# Patient Record
Sex: Female | Born: 1975 | Race: White | Hispanic: No | Marital: Married | Smoking: Never smoker
Health system: Southern US, Community
[De-identification: ages and names within clinical notes are randomized; demographics above are authoritative.]

## PROBLEM LIST (undated history)

## (undated) DIAGNOSIS — M1990 Unspecified osteoarthritis, unspecified site: Secondary | ICD-10-CM

## (undated) DIAGNOSIS — K5909 Other constipation: Secondary | ICD-10-CM

## (undated) DIAGNOSIS — Z789 Other specified health status: Secondary | ICD-10-CM

## (undated) DIAGNOSIS — N200 Calculus of kidney: Secondary | ICD-10-CM

## (undated) HISTORY — PX: BACK SURGERY: SHX140

## (undated) HISTORY — DX: Other specified health status: Z78.9

## (undated) HISTORY — DX: Unspecified osteoarthritis, unspecified site: M19.90

## (undated) HISTORY — PX: APPENDECTOMY: SHX54

## (undated) HISTORY — PX: ABDOMINAL HYSTERECTOMY: SHX81

## (undated) HISTORY — PX: NEPHRECTOMY: SHX65

---

## 1998-07-11 HISTORY — PX: KIDNEY STONE SURGERY: SHX686

## 2004-05-13 ENCOUNTER — Emergency Department: Payer: Self-pay | Admitting: Internal Medicine

## 2005-01-13 ENCOUNTER — Emergency Department: Payer: Self-pay | Admitting: Emergency Medicine

## 2005-01-27 ENCOUNTER — Ambulatory Visit: Payer: Self-pay

## 2005-07-09 ENCOUNTER — Emergency Department: Payer: Self-pay | Admitting: Emergency Medicine

## 2005-07-11 HISTORY — PX: LITHOTRIPSY: SUR834

## 2006-01-07 ENCOUNTER — Emergency Department: Payer: Self-pay | Admitting: Internal Medicine

## 2006-05-29 ENCOUNTER — Emergency Department: Payer: Self-pay | Admitting: Emergency Medicine

## 2006-10-10 ENCOUNTER — Emergency Department: Payer: Self-pay | Admitting: Emergency Medicine

## 2006-10-11 ENCOUNTER — Emergency Department: Payer: Self-pay

## 2006-10-16 ENCOUNTER — Emergency Department: Payer: Self-pay

## 2007-04-09 ENCOUNTER — Emergency Department: Payer: Self-pay | Admitting: Emergency Medicine

## 2007-04-11 ENCOUNTER — Emergency Department: Payer: Self-pay | Admitting: Emergency Medicine

## 2007-07-24 ENCOUNTER — Emergency Department: Payer: Self-pay | Admitting: Emergency Medicine

## 2007-11-05 ENCOUNTER — Emergency Department: Payer: Self-pay | Admitting: Emergency Medicine

## 2008-01-20 ENCOUNTER — Emergency Department: Payer: Self-pay

## 2008-01-22 ENCOUNTER — Emergency Department: Payer: Self-pay | Admitting: Emergency Medicine

## 2008-04-14 ENCOUNTER — Ambulatory Visit: Payer: Self-pay | Admitting: Unknown Physician Specialty

## 2008-04-17 ENCOUNTER — Ambulatory Visit: Payer: Self-pay | Admitting: Unknown Physician Specialty

## 2008-11-05 ENCOUNTER — Emergency Department: Payer: Self-pay | Admitting: Emergency Medicine

## 2009-02-01 ENCOUNTER — Emergency Department: Payer: Self-pay | Admitting: Emergency Medicine

## 2009-02-22 ENCOUNTER — Emergency Department: Payer: Self-pay | Admitting: Emergency Medicine

## 2009-02-26 ENCOUNTER — Ambulatory Visit: Payer: Self-pay | Admitting: Urology

## 2009-03-08 ENCOUNTER — Emergency Department: Payer: Self-pay | Admitting: Emergency Medicine

## 2009-05-11 ENCOUNTER — Emergency Department: Payer: Self-pay | Admitting: Emergency Medicine

## 2009-05-27 ENCOUNTER — Ambulatory Visit: Payer: Self-pay | Admitting: Urology

## 2009-07-16 ENCOUNTER — Ambulatory Visit: Payer: Self-pay | Admitting: Anesthesiology

## 2009-08-02 ENCOUNTER — Emergency Department: Payer: Self-pay | Admitting: Emergency Medicine

## 2009-08-21 ENCOUNTER — Emergency Department: Payer: Self-pay | Admitting: Emergency Medicine

## 2009-09-28 ENCOUNTER — Emergency Department: Payer: Self-pay | Admitting: Emergency Medicine

## 2010-01-04 ENCOUNTER — Emergency Department: Payer: Self-pay | Admitting: Emergency Medicine

## 2010-04-26 ENCOUNTER — Emergency Department: Payer: Self-pay | Admitting: Emergency Medicine

## 2010-07-25 ENCOUNTER — Emergency Department: Payer: Self-pay | Admitting: Internal Medicine

## 2010-08-24 ENCOUNTER — Emergency Department: Payer: Self-pay | Admitting: Emergency Medicine

## 2010-12-21 ENCOUNTER — Emergency Department: Payer: Self-pay | Admitting: Emergency Medicine

## 2010-12-29 ENCOUNTER — Inpatient Hospital Stay: Payer: Self-pay | Admitting: Unknown Physician Specialty

## 2011-02-24 ENCOUNTER — Emergency Department: Payer: Self-pay | Admitting: Emergency Medicine

## 2011-04-19 ENCOUNTER — Emergency Department: Payer: Self-pay | Admitting: Emergency Medicine

## 2011-06-28 ENCOUNTER — Emergency Department: Payer: Self-pay | Admitting: Emergency Medicine

## 2011-08-05 ENCOUNTER — Emergency Department: Payer: Self-pay | Admitting: Emergency Medicine

## 2011-08-05 LAB — URINALYSIS, COMPLETE
Bilirubin,UR: NEGATIVE
Ketone: NEGATIVE
Nitrite: NEGATIVE
Protein: 30
RBC,UR: 6 /HPF (ref 0–5)
Specific Gravity: 1.023 (ref 1.003–1.030)
WBC UR: 21 /HPF (ref 0–5)

## 2011-08-05 LAB — CBC
HCT: 37.3 % (ref 35.0–47.0)
HGB: 12.6 g/dL (ref 12.0–16.0)
MCH: 28.6 pg (ref 26.0–34.0)
MCHC: 33.9 g/dL (ref 32.0–36.0)
MCV: 84 fL (ref 80–100)
RBC: 4.42 10*6/uL (ref 3.80–5.20)
WBC: 10.1 10*3/uL (ref 3.6–11.0)

## 2011-08-05 LAB — COMPREHENSIVE METABOLIC PANEL
Albumin: 3.9 g/dL (ref 3.4–5.0)
Alkaline Phosphatase: 88 U/L (ref 50–136)
BUN: 10 mg/dL (ref 7–18)
Bilirubin,Total: 0.4 mg/dL (ref 0.2–1.0)
Calcium, Total: 9 mg/dL (ref 8.5–10.1)
Creatinine: 0.54 mg/dL — ABNORMAL LOW (ref 0.60–1.30)
EGFR (African American): >60
Glucose: 100 mg/dL — ABNORMAL HIGH (ref 65–99)
Potassium: 4 mmol/L (ref 3.5–5.1)
SGOT(AST): 24 U/L (ref 15–37)
SGPT (ALT): 20 U/L
Total Protein: 7.9 g/dL (ref 6.4–8.2)

## 2011-08-07 LAB — URINE CULTURE

## 2011-11-28 ENCOUNTER — Emergency Department: Payer: Self-pay | Admitting: Emergency Medicine

## 2011-11-28 LAB — URINALYSIS, COMPLETE
Blood: NEGATIVE
Protein: NEGATIVE
RBC,UR: 2 /HPF (ref 0–5)
Squamous Epithelial: 19
WBC UR: 1 /HPF (ref 0–5)

## 2011-11-28 LAB — BASIC METABOLIC PANEL
Anion Gap: 9 (ref 7–16)
Creatinine: 0.69 mg/dL (ref 0.60–1.30)
EGFR (African American): >60
Osmolality: 281 (ref 275–301)
Potassium: 3.9 mmol/L (ref 3.5–5.1)
Sodium: 141 mmol/L (ref 136–145)

## 2011-11-28 LAB — CBC
HCT: 35.9 % (ref 35.0–47.0)
MCH: 28.4 pg (ref 26.0–34.0)
MCHC: 33.8 g/dL (ref 32.0–36.0)
MCV: 84 fL (ref 80–100)
RDW: 12.7 % (ref 11.5–14.5)
WBC: 7.5 10*3/uL (ref 3.6–11.0)

## 2011-11-29 LAB — URINE CULTURE

## 2012-02-08 ENCOUNTER — Emergency Department: Payer: Self-pay

## 2012-02-08 LAB — BASIC METABOLIC PANEL
Anion Gap: 9 (ref 7–16)
BUN: 12 mg/dL (ref 7–18)
Calcium, Total: 8.7 mg/dL (ref 8.5–10.1)
Chloride: 104 mmol/L (ref 98–107)
Co2: 26 mmol/L (ref 21–32)
Creatinine: 0.68 mg/dL (ref 0.60–1.30)
Glucose: 106 mg/dL — ABNORMAL HIGH (ref 65–99)
Osmolality: 278 (ref 275–301)
Potassium: 3.7 mmol/L (ref 3.5–5.1)
Sodium: 139 mmol/L (ref 136–145)

## 2012-02-08 LAB — URINALYSIS, COMPLETE
Bacteria: NONE SEEN
Blood: NEGATIVE
Glucose,UR: NEGATIVE mg/dL (ref 0–75)
Leukocyte Esterase: NEGATIVE
Nitrite: NEGATIVE
Ph: 7 (ref 4.5–8.0)
WBC UR: 1 /HPF (ref 0–5)

## 2012-02-08 LAB — CBC
HCT: 36.2 % (ref 35.0–47.0)
HGB: 12.3 g/dL (ref 12.0–16.0)
MCH: 28.2 pg (ref 26.0–34.0)
RDW: 13.2 % (ref 11.5–14.5)
WBC: 10.1 10*3/uL (ref 3.6–11.0)

## 2012-03-20 ENCOUNTER — Emergency Department: Payer: Self-pay | Admitting: Emergency Medicine

## 2012-03-20 LAB — CK TOTAL AND CKMB (NOT AT ARMC)
CK, Total: 68 U/L (ref 21–215)
CK-MB: 0.7 ng/mL (ref 0.5–3.6)

## 2012-03-20 LAB — COMPREHENSIVE METABOLIC PANEL
Alkaline Phosphatase: 109 U/L (ref 50–136)
Anion Gap: 9 (ref 7–16)
Bilirubin,Total: 0.4 mg/dL (ref 0.2–1.0)
Chloride: 107 mmol/L (ref 98–107)
Co2: 24 mmol/L (ref 21–32)
Creatinine: 0.69 mg/dL (ref 0.60–1.30)
EGFR (Non-African Amer.): >60
Osmolality: 281 (ref 275–301)
SGOT(AST): 18 U/L (ref 15–37)
SGPT (ALT): 19 U/L (ref 12–78)
Total Protein: 7.3 g/dL (ref 6.4–8.2)

## 2012-03-20 LAB — CBC
HCT: 36.2 % (ref 35.0–47.0)
HGB: 12.4 g/dL (ref 12.0–16.0)
MCH: 28.2 pg (ref 26.0–34.0)
MCHC: 34.2 g/dL (ref 32.0–36.0)
MCV: 82 fL (ref 80–100)
Platelet: 271 10*3/uL (ref 150–440)
RDW: 13 % (ref 11.5–14.5)
WBC: 8.3 10*3/uL (ref 3.6–11.0)

## 2012-06-03 ENCOUNTER — Emergency Department: Payer: Self-pay | Admitting: Emergency Medicine

## 2012-06-03 LAB — COMPREHENSIVE METABOLIC PANEL
Albumin: 4.3 g/dL (ref 3.4–5.0)
BUN: 10 mg/dL (ref 7–18)
Calcium, Total: 8.8 mg/dL (ref 8.5–10.1)
Chloride: 105 mmol/L (ref 98–107)
EGFR (Non-African Amer.): >60
Glucose: 99 mg/dL (ref 65–99)
Osmolality: 273 (ref 275–301)
Potassium: 3.8 mmol/L (ref 3.5–5.1)
SGOT(AST): 24 U/L (ref 15–37)
Sodium: 137 mmol/L (ref 136–145)
Total Protein: 8.1 g/dL (ref 6.4–8.2)

## 2012-06-03 LAB — CBC
HGB: 13.6 g/dL (ref 12.0–16.0)
MCH: 28.7 pg (ref 26.0–34.0)
Platelet: 249 10*3/uL (ref 150–440)
RDW: 13.5 % (ref 11.5–14.5)

## 2012-06-03 LAB — URINALYSIS, COMPLETE
Bacteria: NONE SEEN
Glucose,UR: NEGATIVE mg/dL (ref 0–75)
Ketone: NEGATIVE
Nitrite: NEGATIVE
Protein: NEGATIVE
RBC,UR: 4 /HPF (ref 0–5)
Specific Gravity: 1.019 (ref 1.003–1.030)
Squamous Epithelial: 6
WBC UR: 27 /HPF (ref 0–5)

## 2012-06-03 LAB — LIPASE, BLOOD: Lipase: 207 U/L (ref 73–393)

## 2012-06-05 LAB — URINE CULTURE

## 2012-06-18 ENCOUNTER — Emergency Department: Payer: Self-pay | Admitting: Emergency Medicine

## 2012-06-18 LAB — URINALYSIS, COMPLETE
Bacteria: NONE SEEN
Blood: NEGATIVE
Glucose,UR: NEGATIVE mg/dL (ref 0–75)
Ketone: NEGATIVE
Nitrite: NEGATIVE
RBC,UR: 1 /HPF (ref 0–5)
Specific Gravity: 1.019 (ref 1.003–1.030)
WBC UR: 6 /HPF (ref 0–5)

## 2012-06-18 LAB — CBC
HGB: 12.8 g/dL (ref 12.0–16.0)
MCHC: 34.3 g/dL (ref 32.0–36.0)
RBC: 4.57 10*6/uL (ref 3.80–5.20)
RDW: 13.5 % (ref 11.5–14.5)

## 2012-06-18 LAB — COMPREHENSIVE METABOLIC PANEL
Albumin: 3.8 g/dL (ref 3.4–5.0)
Anion Gap: 9 (ref 7–16)
BUN: 10 mg/dL (ref 7–18)
Bilirubin,Total: 0.4 mg/dL (ref 0.2–1.0)
Calcium, Total: 9.1 mg/dL (ref 8.5–10.1)
Chloride: 105 mmol/L (ref 98–107)
EGFR (African American): >60
Osmolality: 280 (ref 275–301)
Potassium: 3.6 mmol/L (ref 3.5–5.1)
SGOT(AST): 25 U/L (ref 15–37)
SGPT (ALT): 26 U/L (ref 12–78)
Sodium: 140 mmol/L (ref 136–145)
Total Protein: 7.4 g/dL (ref 6.4–8.2)

## 2012-06-18 LAB — LIPASE, BLOOD: Lipase: 178 U/L (ref 73–393)

## 2012-07-19 ENCOUNTER — Emergency Department: Payer: Self-pay | Admitting: Emergency Medicine

## 2012-07-19 LAB — URINALYSIS, COMPLETE
Bilirubin,UR: NEGATIVE
Blood: NEGATIVE
Glucose,UR: NEGATIVE mg/dL (ref 0–75)
Ketone: NEGATIVE
Leukocyte Esterase: NEGATIVE
Nitrite: NEGATIVE
Ph: 7 (ref 4.5–8.0)
RBC,UR: 3 /HPF (ref 0–5)
Specific Gravity: 1.019 (ref 1.003–1.030)
WBC UR: 3 /HPF (ref 0–5)

## 2012-07-19 LAB — CBC
HGB: 12.6 g/dL (ref 12.0–16.0)
MCH: 27.2 pg (ref 26.0–34.0)
MCHC: 32.8 g/dL (ref 32.0–36.0)
MCV: 83 fL (ref 80–100)
Platelet: 271 10*3/uL (ref 150–440)
RBC: 4.64 10*6/uL (ref 3.80–5.20)
RDW: 13.6 % (ref 11.5–14.5)
WBC: 10.2 10*3/uL (ref 3.6–11.0)

## 2012-07-19 LAB — COMPREHENSIVE METABOLIC PANEL
Albumin: 4 g/dL (ref 3.4–5.0)
Alkaline Phosphatase: 122 U/L (ref 50–136)
BUN: 10 mg/dL (ref 7–18)
Bilirubin,Total: 0.4 mg/dL (ref 0.2–1.0)
Calcium, Total: 8.8 mg/dL (ref 8.5–10.1)
EGFR (African American): >60
Glucose: 101 mg/dL — ABNORMAL HIGH (ref 65–99)
Osmolality: 277 (ref 275–301)
Potassium: 3.8 mmol/L (ref 3.5–5.1)
SGOT(AST): 20 U/L (ref 15–37)
SGPT (ALT): 24 U/L (ref 12–78)
Total Protein: 8.2 g/dL (ref 6.4–8.2)

## 2012-07-19 LAB — LIPASE, BLOOD: Lipase: 162 U/L (ref 73–393)

## 2012-09-25 ENCOUNTER — Emergency Department: Payer: Self-pay | Admitting: Emergency Medicine

## 2012-12-04 ENCOUNTER — Emergency Department: Payer: Self-pay | Admitting: Emergency Medicine

## 2012-12-04 LAB — CBC
HCT: 36.3 % (ref 35.0–47.0)
HGB: 12.4 g/dL (ref 12.0–16.0)
MCH: 27.6 pg (ref 26.0–34.0)
MCHC: 34.1 g/dL (ref 32.0–36.0)
Platelet: 284 10*3/uL (ref 150–440)
RBC: 4.49 10*6/uL (ref 3.80–5.20)
WBC: 10.6 10*3/uL (ref 3.6–11.0)

## 2012-12-04 LAB — BASIC METABOLIC PANEL
Anion Gap: 7 (ref 7–16)
BUN: 9 mg/dL (ref 7–18)
Chloride: 103 mmol/L (ref 98–107)
Co2: 27 mmol/L (ref 21–32)
Creatinine: 0.61 mg/dL (ref 0.60–1.30)
EGFR (Non-African Amer.): >60
Glucose: 124 mg/dL — ABNORMAL HIGH (ref 65–99)
Osmolality: 274 (ref 275–301)
Potassium: 3.9 mmol/L (ref 3.5–5.1)

## 2012-12-04 LAB — URINALYSIS, COMPLETE
Bilirubin,UR: NEGATIVE
Ph: 5 (ref 4.5–8.0)
Protein: NEGATIVE
RBC,UR: 1 /HPF (ref 0–5)
Specific Gravity: 1.013 (ref 1.003–1.030)

## 2013-04-28 ENCOUNTER — Emergency Department: Payer: Self-pay | Admitting: Emergency Medicine

## 2013-06-09 ENCOUNTER — Emergency Department: Payer: Self-pay | Admitting: Emergency Medicine

## 2013-06-27 ENCOUNTER — Encounter (HOSPITAL_COMMUNITY): Payer: Self-pay | Admitting: Emergency Medicine

## 2013-06-27 DIAGNOSIS — Z79899 Other long term (current) drug therapy: Secondary | ICD-10-CM | POA: Insufficient documentation

## 2013-06-27 DIAGNOSIS — Z9889 Other specified postprocedural states: Secondary | ICD-10-CM | POA: Insufficient documentation

## 2013-06-27 DIAGNOSIS — M5430 Sciatica, unspecified side: Secondary | ICD-10-CM | POA: Insufficient documentation

## 2013-06-27 DIAGNOSIS — Z87442 Personal history of urinary calculi: Secondary | ICD-10-CM | POA: Insufficient documentation

## 2013-06-27 DIAGNOSIS — Z791 Long term (current) use of non-steroidal anti-inflammatories (NSAID): Secondary | ICD-10-CM | POA: Insufficient documentation

## 2013-06-27 NOTE — ED Notes (Addendum)
Pt. reports persistent lower back pain radiating to left leg onset this evening seen at [facility] 2 weeks ago after a fall discharge home with prescription pain medication /muscle relaxant with no relief. Denies recent injury. No hematuria or dysuria.

## 2013-06-28 ENCOUNTER — Emergency Department (HOSPITAL_COMMUNITY)
Admission: EM | Admit: 2013-06-28 | Discharge: 2013-06-28 | Disposition: A | Discharge status: Home / Self Care | Payer: Self-pay | Attending: Emergency Medicine | Admitting: Emergency Medicine

## 2013-06-28 DIAGNOSIS — M549 Dorsalgia, unspecified: Secondary | ICD-10-CM

## 2013-06-28 DIAGNOSIS — M5432 Sciatica, left side: Secondary | ICD-10-CM

## 2013-06-28 HISTORY — DX: Calculus of kidney: N20.0

## 2013-06-28 LAB — URINE MICROSCOPIC-ADD ON

## 2013-06-28 LAB — URINALYSIS, ROUTINE W REFLEX MICROSCOPIC
Glucose, UA: NEGATIVE mg/dL
Hgb urine dipstick: NEGATIVE
Ketones, ur: NEGATIVE mg/dL
Nitrite: NEGATIVE
Protein, ur: NEGATIVE mg/dL
Urobilinogen, UA: 1 mg/dL (ref 0.0–1.0)

## 2013-06-28 MED ORDER — HYDROMORPHONE HCL PF 1 MG/ML IJ SOLN
1.0000 mg | Freq: Once | INTRAMUSCULAR | Status: AC
  Administered 2013-06-28: 1 mg via INTRAVENOUS
  Filled 2013-06-28: qty 1

## 2013-06-28 MED ORDER — OXYCODONE-ACETAMINOPHEN 7.5-325 MG PO TABS: 1.0000 | ORAL_TABLET | ORAL | Status: DC | PRN

## 2013-06-28 MED ORDER — PREDNISONE 20 MG PO TABS: 40.0000 mg | ORAL_TABLET | Freq: Every day | ORAL | Status: DC

## 2013-06-28 MED ORDER — DIAZEPAM 5 MG PO TABS
5.0000 mg | ORAL_TABLET | Freq: Once | ORAL | Status: AC
  Administered 2013-06-28: 5 mg via ORAL
  Filled 2013-06-28: qty 1

## 2013-06-28 MED ORDER — DEXAMETHASONE SODIUM PHOSPHATE 10 MG/ML IJ SOLN: 10.0000 mg | Freq: Once | INTRAMUSCULAR | Status: DC

## 2013-06-28 MED ORDER — FENTANYL CITRATE 0.05 MG/ML IJ SOLN
50.0000 ug | Freq: Once | INTRAMUSCULAR | Status: AC
  Administered 2013-06-28: 50 ug via INTRAVENOUS
  Filled 2013-06-28: qty 2

## 2013-06-28 MED ORDER — DEXAMETHASONE SODIUM PHOSPHATE 10 MG/ML IJ SOLN
10.0000 mg | Freq: Once | INTRAMUSCULAR | Status: AC
  Administered 2013-06-28: 10 mg via INTRAVENOUS
  Filled 2013-06-28: qty 1

## 2013-06-28 MED ORDER — DIAZEPAM 5 MG PO TABS: 5.0000 mg | ORAL_TABLET | Freq: Two times a day (BID) | ORAL | Status: DC

## 2013-06-28 MED ORDER — LORAZEPAM 2 MG/ML IJ SOLN
1.0000 mg | Freq: Once | INTRAMUSCULAR | Status: AC
  Administered 2013-06-28: 1 mg via INTRAVENOUS
  Filled 2013-06-28: qty 1

## 2013-06-28 MED ORDER — HYDROMORPHONE HCL PF 1 MG/ML IJ SOLN: 1.0000 mg | Freq: Once | INTRAMUSCULAR | Status: DC

## 2013-06-28 NOTE — ED Provider Notes (Signed)
CSN: [id number]     Arrival date & time 06/27/13  2304 History   First MD Initiated Contact with Patient 06/28/13 0215     Chief Complaint  Patient presents with  . Back Pain   (Consider location/radiation/quality/duration/timing/severity/associated sxs/prior Treatment) HPI Comments: Patient is a 37 year old female with a history of back surgery and kidney stones who presents today for worsening back pain. Patient states the pain originates in her left low back and intermittently radiates down her posterior left leg. Patient states the pain has been gradually worsening over the last 2 weeks. Ambulation worsens the pain and pain is mildly relieved with rest. Patient has tried Percocet, ibuprofen, and Robaxin for her symptoms without relief. She has orthopedic followup scheduled for 07/12/2013. Patient denies associated fever, a history of cancer or IV drug use, abdominal pain, vomiting, dysuria or hematuria, vaginal complaints, bowel or bladder incontinence, general perianal numbness, saddle anesthesia, numbness/tingling, or an inability to ambulate.  Patient is a 37 y.o. female presenting with back pain. The history is provided by the patient. No language interpreter was used.  Back Pain Associated symptoms: no abdominal pain, no dysuria, no fever, no numbness and no weakness     Past Medical History  Diagnosis Date  . Kidney stone    Past Surgical History  Procedure Laterality Date  . Back surgery    . Abdominal hysterectomy     No family history on file. History  Substance Use Topics  . Smoking status: Never Smoker   . Smokeless tobacco: Not on file  . Alcohol Use: No   OB History   Grav Para Term Preterm Abortions TAB SAB Ect Mult Living                 Review of Systems  Constitutional: Negative for fever.  Gastrointestinal: Negative for vomiting and abdominal pain.  Genitourinary: Negative for dysuria, hematuria, vaginal bleeding and vaginal discharge.  Musculoskeletal:  Positive for back pain.  Neurological: Negative for weakness and numbness.  All other systems reviewed and are negative.    Allergies  Review of patient's allergies indicates no known allergies.  Home Medications   Current Outpatient Rx  Name  Route  Sig  Dispense  Refill  . methocarbamol (ROBAXIN) 750 MG tablet   Oral   Take 1,500 mg by mouth 3 (three) times daily.         . naproxen sodium (ANAPROX) 220 MG tablet   Oral   Take 220 mg by mouth 2 (two) times daily with a meal.         . diazepam (VALIUM) 5 MG tablet   Oral   Take 1 tablet (5 mg total) by mouth 2 (two) times daily.   10 tablet   0   . oxyCODONE-acetaminophen (PERCOCET) 7.5-325 MG per tablet   Oral   Take 1 tablet by mouth every 4 (four) hours as needed for pain.   17 tablet   0   . predniSONE (DELTASONE) 20 MG tablet   Oral   Take 2 tablets (40 mg total) by mouth daily.   10 tablet   0    BP 113/65  Pulse 103  Temp(Src) 98.2 F (36.8 C)  Resp 17  Ht 5\' 5"  (1.651 m)  Wt 225 lb (102.059 kg)  BMI 37.44 kg/m2  SpO2 95%  Physical Exam  Nursing note and vitals reviewed. Constitutional: She is oriented to person, place, and time. She appears well-developed and well-nourished. No distress.  HENT:  Head: Normocephalic and atraumatic.  Eyes: Conjunctivae and EOM are normal. No scleral icterus.  Neck: Normal range of motion.  Cardiovascular: Normal rate, regular rhythm and intact distal pulses.   DP and PT pulses 2+ in left lower extremity. Capillary refill normal.  Pulmonary/Chest: Effort normal. No respiratory distress.  Musculoskeletal: She exhibits tenderness (L lumbar paraspinal muscles).       Right shoulder: She exhibits tenderness.       Lumbar back: She exhibits tenderness.       Back:  No midline tenderness to the lumbosacral spine. No bony deformities or step-offs palpated. Positive straight leg raise.  Neurological: She is alert and oriented to person, place, and time. She has  normal reflexes.  Patellar and Achilles reflexes 2+ and left lower extremity. No gross sensory deficits appreciated. Patient moves extremities without ataxia.  Skin: Skin is warm and dry. No rash noted. She is not diaphoretic. No erythema. No pallor.  Psychiatric: She has a normal mood and affect. Her behavior is normal.    ED Course  Procedures (including critical care time) Labs Review Labs Reviewed  URINALYSIS, ROUTINE W REFLEX MICROSCOPIC - Abnormal; Notable for the following:    APPearance CLOUDY (*)    Leukocytes, UA MODERATE (*)    All other components within normal limits  URINE MICROSCOPIC-ADD ON - Abnormal; Notable for the following:    Squamous Epithelial / LPF MANY (*)    Bacteria, UA FEW (*)    All other components within normal limits  URINE CULTURE   Imaging Review No results found.  EKG Interpretation   None       MDM   1. Back pain   2. Sciatica, left    37 year old female with a history of back surgery presents for worsening back pain. Pain began 2 weeks ago and has been gradually worsening since without relief from Percocet, Robaxin, or ibuprofen. Patient well and nontoxic appearing, hemodynamically stable, and afebrile. She denies fevers, chills, trauma or injury to her back, incontinence, or numbness/tingling. Patient neurovascularly intact in physical exam. No tenderness to the thoracic or lumbosacral midline appreciated. Patient does appear to be in an decent amount of discomfort; she is tearful at bedside. Will treat pain with Dilaudid, Valium, and Decadron and reassess. Urinalysis pending.  Urinalysis nonsuggestive of infection and without blood. Patient's pain has significantly improved with Dilaudid, Valium, Ativan, and Decadron. Patient now weight bearing with some discomfort; overall movement has greatly improved since arrival. No red flags or signs concerning for cauda equina; do not believe emergent MR is indicated at this time. Patient with  orthopedic f/u for which she is reliable. She is stable and appropriate for discharge today with orthopedic f/u. Return precautions discussed and patient agreeable to plan with no unaddressed concerns.    Antony Madura, [place] 06/28/13 [phone]

## 2013-06-28 NOTE — ED Provider Notes (Signed)
Medical screening examination/treatment/procedure(s) were conducted as a shared visit with non-physician practitioner(s) and myself.  I personally evaluated the patient during the encounter.  EKG Interpretation   None      Pt was able to walk earlier tonight, couple weeks pain low back radiates to left knee, no weak/numb left leg, Pt understands no apparent indication for emergent MRI in ED.  Hurman Horn, MD 07/02/13 [phone]

## 2013-06-28 NOTE — ED Notes (Signed)
Pt and pts husband refused crutches - state that she has them at home. Detailed explanation of use of crutches given.

## 2013-06-29 LAB — URINE CULTURE: Colony Count: >=100000

## 2013-07-01 NOTE — ED Provider Notes (Signed)
Medical screening examination/treatment/procedure(s) were performed by non-physician practitioner and as supervising physician I was immediately available for consultation/collaboration.  Alek Poncedeleon M Ariella Voit, MD 07/01/13 2354 

## 2013-10-24 ENCOUNTER — Other Ambulatory Visit (HOSPITAL_COMMUNITY): Payer: Self-pay | Admitting: *Deleted

## 2013-10-24 DIAGNOSIS — R011 Cardiac murmur, unspecified: Secondary | ICD-10-CM

## 2013-10-25 ENCOUNTER — Other Ambulatory Visit: Payer: Self-pay

## 2013-10-25 ENCOUNTER — Other Ambulatory Visit (INDEPENDENT_AMBULATORY_CARE_PROVIDER_SITE_OTHER): Payer: BC Managed Care – PPO

## 2013-10-25 DIAGNOSIS — R011 Cardiac murmur, unspecified: Secondary | ICD-10-CM

## 2013-12-25 ENCOUNTER — Ambulatory Visit: Payer: Self-pay | Admitting: Specialist

## 2013-12-26 ENCOUNTER — Ambulatory Visit: Payer: BC Managed Care – PPO | Admitting: Podiatry

## 2014-04-10 DIAGNOSIS — E668 Other obesity: Secondary | ICD-10-CM | POA: Insufficient documentation

## 2014-04-10 DIAGNOSIS — E669 Obesity, unspecified: Secondary | ICD-10-CM | POA: Insufficient documentation

## 2014-06-09 ENCOUNTER — Emergency Department: Payer: Self-pay | Admitting: Emergency Medicine

## 2014-06-09 LAB — COMPREHENSIVE METABOLIC PANEL
ALT: 23 U/L
ANION GAP: 6 — AB (ref 7–16)
AST: 35 U/L (ref 15–37)
Albumin: 4 g/dL (ref 3.4–5.0)
Alkaline Phosphatase: 99 U/L
BUN: 11 mg/dL (ref 7–18)
Bilirubin,Total: 0.6 mg/dL (ref 0.2–1.0)
CHLORIDE: 106 mmol/L (ref 98–107)
Calcium, Total: 8.8 mg/dL (ref 8.5–10.1)
Co2: 28 mmol/L (ref 21–32)
Creatinine: 0.45 mg/dL — ABNORMAL LOW (ref 0.60–1.30)
EGFR (African American): >60
EGFR (Non-African Amer.): >60
Glucose: 89 mg/dL (ref 65–99)
OSMOLALITY: 278 (ref 275–301)
POTASSIUM: 4.4 mmol/L (ref 3.5–5.1)
Sodium: 140 mmol/L (ref 136–145)
TOTAL PROTEIN: 7.6 g/dL (ref 6.4–8.2)

## 2014-06-09 LAB — CBC
HCT: 38.7 % (ref 35.0–47.0)
HGB: 12.5 g/dL (ref 12.0–16.0)
MCH: 28.1 pg (ref 26.0–34.0)
MCHC: 32.3 g/dL (ref 32.0–36.0)
MCV: 87 fL (ref 80–100)
Platelet: 232 10*3/uL (ref 150–440)
RBC: 4.44 10*6/uL (ref 3.80–5.20)
RDW: 13.7 % (ref 11.5–14.5)
WBC: 5.9 10*3/uL (ref 3.6–11.0)

## 2014-06-09 LAB — URINALYSIS, COMPLETE
Bilirubin,UR: NEGATIVE
Blood: NEGATIVE
Glucose,UR: NEGATIVE mg/dL (ref 0–75)
KETONE: NEGATIVE
NITRITE: NEGATIVE
PH: 6 (ref 4.5–8.0)
Protein: NEGATIVE
Specific Gravity: 1.021 (ref 1.003–1.030)
Squamous Epithelial: 6
WBC UR: 1 /HPF (ref 0–5)

## 2014-07-09 ENCOUNTER — Emergency Department: Payer: Self-pay | Admitting: Emergency Medicine

## 2014-07-09 LAB — CBC
HCT: 38.6 % (ref 35.0–47.0)
HGB: 12.6 g/dL (ref 12.0–16.0)
MCH: 28.4 pg (ref 26.0–34.0)
MCHC: 32.7 g/dL (ref 32.0–36.0)
MCV: 87 fL (ref 80–100)
Platelet: 208 10*3/uL (ref 150–440)
RBC: 4.44 10*6/uL (ref 3.80–5.20)
RDW: 13 % (ref 11.5–14.5)
WBC: 9.2 10*3/uL (ref 3.6–11.0)

## 2014-07-09 LAB — COMPREHENSIVE METABOLIC PANEL
ALK PHOS: 97 U/L
ANION GAP: 8 (ref 7–16)
Albumin: 3.8 g/dL (ref 3.4–5.0)
BUN: 8 mg/dL (ref 7–18)
Bilirubin,Total: 0.5 mg/dL (ref 0.2–1.0)
CO2: 27 mmol/L (ref 21–32)
Calcium, Total: 9 mg/dL (ref 8.5–10.1)
Chloride: 108 mmol/L — ABNORMAL HIGH (ref 98–107)
Creatinine: 0.58 mg/dL — ABNORMAL LOW (ref 0.60–1.30)
EGFR (Non-African Amer.): >60
GLUCOSE: 94 mg/dL (ref 65–99)
Osmolality: 283 (ref 275–301)
Potassium: 3.5 mmol/L (ref 3.5–5.1)
SGOT(AST): 27 U/L (ref 15–37)
SGPT (ALT): 16 U/L
SODIUM: 143 mmol/L (ref 136–145)
Total Protein: 7.5 g/dL (ref 6.4–8.2)

## 2014-07-09 LAB — GC/CHLAMYDIA PROBE AMP

## 2014-07-09 LAB — URINALYSIS, COMPLETE
Bilirubin,UR: NEGATIVE
Glucose,UR: NEGATIVE mg/dL (ref 0–75)
Ketone: NEGATIVE
Nitrite: POSITIVE
Ph: 6 (ref 4.5–8.0)
Protein: >=500
RBC,UR: 80 /HPF (ref 0–5)
Specific Gravity: 1.013 (ref 1.003–1.030)
WBC UR: 822 /HPF (ref 0–5)

## 2014-07-09 LAB — WET PREP, GENITAL

## 2014-07-11 HISTORY — PX: LAPAROSCOPIC GASTRIC SLEEVE RESECTION: SHX5895

## 2014-07-11 LAB — URINE CULTURE

## 2014-07-23 ENCOUNTER — Ambulatory Visit: Payer: Self-pay | Admitting: Specialist

## 2014-12-17 ENCOUNTER — Encounter: Payer: Self-pay | Admitting: *Deleted

## 2014-12-17 ENCOUNTER — Emergency Department
Admission: EM | Admit: 2014-12-17 | Discharge: 2014-12-17 | Disposition: A | Discharge status: Home / Self Care | Payer: Self-pay | Attending: Emergency Medicine | Admitting: Emergency Medicine

## 2014-12-17 DIAGNOSIS — L508 Other urticaria: Secondary | ICD-10-CM

## 2014-12-17 DIAGNOSIS — Z7952 Long term (current) use of systemic steroids: Secondary | ICD-10-CM | POA: Insufficient documentation

## 2014-12-17 DIAGNOSIS — Z79899 Other long term (current) drug therapy: Secondary | ICD-10-CM | POA: Insufficient documentation

## 2014-12-17 DIAGNOSIS — L509 Urticaria, unspecified: Secondary | ICD-10-CM | POA: Insufficient documentation

## 2014-12-17 DIAGNOSIS — Z791 Long term (current) use of non-steroidal anti-inflammatories (NSAID): Secondary | ICD-10-CM | POA: Insufficient documentation

## 2014-12-17 NOTE — ED Provider Notes (Signed)
[facility] Emergency Department Provider Note  ____________________________________________  Time seen:1353 I have reviewed the triage vital signs and the nursing notes.   HISTORY  Chief Complaint Rash   HPI Katelyn Holland is a 39 y.o. female states that today at work she noticed a rash on both her arms. She states that it was red in color and was "moving up her arm". She denies any itching. She is unaware of any contact with any item that she may have had an allergic reaction to an denies any contact with poison oak or poison ivy. She denies any difficulty breathing, swallowing or speaking. She denies any pain.   Past Medical History  Diagnosis Date  . Kidney stone     There are no active problems to display for this patient.   Past Surgical History  Procedure Laterality Date  . Back surgery    . Abdominal hysterectomy    . Laparoscopic gastric sleeve resection      Current Outpatient Rx  Name  Route  Sig  Dispense  Refill  . diazepam (VALIUM) 5 MG tablet   Oral   Take 1 tablet (5 mg total) by mouth 2 (two) times daily.   10 tablet   0   . methocarbamol (ROBAXIN) 750 MG tablet   Oral   Take 1,500 mg by mouth 3 (three) times daily.         . naproxen sodium (ANAPROX) 220 MG tablet   Oral   Take 220 mg by mouth 2 (two) times daily with a meal.         . oxyCODONE-acetaminophen (PERCOCET) 7.5-325 MG per tablet   Oral   Take 1 tablet by mouth every 4 (four) hours as needed for pain.   17 tablet   0   . predniSONE (DELTASONE) 20 MG tablet   Oral   Take 2 tablets (40 mg total) by mouth daily.   10 tablet   0     Allergies Review of patient's allergies indicates no known allergies.  No family history on file.  Social History History  Substance Use Topics  . Smoking status: Never Smoker   . Smokeless tobacco: Not on file  . Alcohol Use: Yes    Review of Systems Constitutional: No fever/chills Eyes: No visual  changes. ENT: No sore throat. Cardiovascular: Denies chest pain. Respiratory: Denies shortness of breath. Gastrointestinal: No abdominal pain.  No nausea, no vomiting.   Genitourinary: Negative for dysuria. Musculoskeletal: Negative for back pain. Skin: Positive for rash. Neurological: Negative for headaches, focal weakness or numbness.  10-point ROS otherwise negative.  ____________________________________________   PHYSICAL EXAM:  VITAL SIGNS: ED Triage Vitals  Enc Vitals Group     BP 12/17/14 1244 114/50 mmHg     Pulse Rate 12/17/14 1244 64     Resp 12/17/14 1244 16     Temp 12/17/14 1244 98.2 F (36.8 C)     Temp Source 12/17/14 1244 Oral     SpO2 12/17/14 1244 97 %     Weight 12/17/14 1244 150 lb (68.04 kg)     Height 12/17/14 1244  (1.626 m)     Head Cir --      Peak Flow --      Pain Score --      Pain Loc --      Pain Edu? --      Excl. in GC? --     Constitutional: Alert and oriented. Well appearing and  in no acute distress. Eyes: Conjunctivae are normal. PERRL. EOMI. Head: Atraumatic. Nose: No congestion/rhinnorhea. Mouth/Throat: Mucous membranes are moist.  Oropharynx non-erythematous. Neck: No stridor.   Hematological/Lymphatic/Immunilogical: No cervical lymphadenopathy. Cardiovascular: Normal rate, regular rhythm. Grossly normal heart sounds.  Good peripheral circulation. Respiratory: Normal respiratory effort.  No retractions. Lungs CTAB. Musculoskeletal: No lower extremity tenderness nor edema.  No joint effusions. Neurologic:  Normal speech and language. No gross focal neurologic deficits are appreciated. Speech is normal. No gait instability. Skin:  Skin is warm, dry and intact. There is absolutely no rash noted on the upper forearms. Patient states that it has completely resolved while being in the emergency room. There is no itching or redness. There is no other rash noted on the torso or lower extremities. Psychiatric: Mood and affect are  normal. Speech and behavior are normal.  ____________________________________________   LABS (all labs ordered are listed, but only abnormal results are displayed)  Labs Reviewed - No data to display  PROCEDURES  Procedure(s) performed: None  Critical Care performed: No  ____________________________________________   INITIAL IMPRESSION / ASSESSMENT AND PLAN / ED COURSE  Pertinent labs & imaging results that were available during my care of the patient were reviewed by me and considered in my medical decision making (see chart for details).  Patient was advised to take some Benadryl if the rash returns. At this time it appears she could've had some urticarial reaction but there is no rash to evaluate at this time. ____________________________________________   FINAL CLINICAL IMPRESSION(S) / ED DIAGNOSES  Final diagnoses:  Urticaria, acute      Tommi RumpsRhonda L Lasonja Lakins, PA-C 12/17/14 1440  Phineas SemenGraydon Goodman, MD 12/17/14 [phone]

## 2014-12-17 NOTE — ED Notes (Signed)
Pt reports she noticed a rash on both of her arms this morning, denies itching

## 2014-12-17 NOTE — ED Notes (Signed)
NAD noted at time of D/C. Pt denies questions or concerns. Pt ambulatory to the lobby at this time.  

## 2014-12-17 NOTE — Discharge Instructions (Signed)
Hives  Hives are itchy, red, puffy (swollen) areas of the skin. Hives can change in size and location on your body. Hives can come and go for hours, days, or weeks. Hives do not spread from person to person (noncontagious). Scratching, exercise, and stress can make your hives worse.  HOME CARE  · Avoid things that cause your hives (triggers).  · Take antihistamine medicines as told by your doctor. Do not drive while taking an antihistamine.  · Take any other medicines for itching as told by your doctor.  · Wear loose-fitting clothing.  · Keep all doctor visits as told.  GET HELP RIGHT AWAY IF:   · You have a fever.  · Your tongue or lips are puffy.  · You have trouble breathing or swallowing.  · You feel tightness in the throat or chest.  · You have belly (abdominal) pain.  · You have lasting or severe itching that is not helped by medicine.  · You have painful or puffy joints.  These problems may be the first sign of a life-threatening allergic reaction. Call your local emergency services (911 in [place]  MAKE SURE YOU:   · Understand these instructions.  · Will watch your condition.  · Will get help right away if you are not doing well or get worse.  Document Released: 04/05/2008 Document Revised: 12/27/2011 Document Reviewed: 09/20/2011  Exit[employer]® Patient Information ©2015 [employer]. This information is not intended to replace advice given to you by your health care provider. Make sure you discuss any questions you have with your health care provider.

## 2014-12-17 NOTE — ED Notes (Signed)
Pt states a rash on top of her hands moving down her arms, upon assessment pt hands appropriate color and tempature

## 2014-12-24 ENCOUNTER — Ambulatory Visit (INDEPENDENT_AMBULATORY_CARE_PROVIDER_SITE_OTHER): Payer: Self-pay | Admitting: Family Medicine

## 2014-12-24 ENCOUNTER — Telehealth: Payer: Self-pay | Admitting: Family Medicine

## 2014-12-24 ENCOUNTER — Encounter: Payer: Self-pay | Admitting: Family Medicine

## 2014-12-24 ENCOUNTER — Other Ambulatory Visit: Payer: Self-pay | Admitting: Family Medicine

## 2014-12-24 VITALS — BP 105/69 | HR 71 | Temp 98.6°F | Resp 16 | Ht 64.0 in | Wt 165.4 lb

## 2014-12-24 DIAGNOSIS — Z8249 Family history of ischemic heart disease and other diseases of the circulatory system: Secondary | ICD-10-CM

## 2014-12-24 DIAGNOSIS — R6882 Decreased libido: Secondary | ICD-10-CM | POA: Insufficient documentation

## 2014-12-24 DIAGNOSIS — G4762 Sleep related leg cramps: Secondary | ICD-10-CM

## 2014-12-24 DIAGNOSIS — G8929 Other chronic pain: Secondary | ICD-10-CM

## 2014-12-24 DIAGNOSIS — Z01419 Encounter for gynecological examination (general) (routine) without abnormal findings: Secondary | ICD-10-CM

## 2014-12-24 DIAGNOSIS — M549 Dorsalgia, unspecified: Principal | ICD-10-CM

## 2014-12-24 MED ORDER — MAGNESIUM 400 MG PO TABS: 1.0000 | ORAL_TABLET | Freq: Every day | ORAL | Status: DC

## 2014-12-24 MED ORDER — CYCLOBENZAPRINE HCL 10 MG PO TABS: 10.0000 mg | ORAL_TABLET | Freq: Every day | ORAL | Status: DC

## 2014-12-24 NOTE — Progress Notes (Signed)
Subjective:    Patient ID: Katelyn Holland, female    DOB: 01/02/1976, 39 y.o.   MRN: [id number]  HPI: Katelyn Holland is a 39 y.o. female presenting on 12/24/2014 for Gynecologic Exam  Pt presents for well-woman exam. Overall doing well. Would like to discuss mammogram recommendations today. She also has concerns about low libido. 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 Doing very well. Back pain is gone. Exercising regularly and trying to eat healthy. She feels great. She does have concerns about leg cramps that keep her awake at night. Has taken flexeril in the past to help. Has not tried magnesium.  Gynecologic Exam The patient's pertinent negatives include no genital itching, genital lesions, genital odor, genital rash, pelvic pain, vaginal bleeding or vaginal discharge. Pertinent negatives include no abdominal pain, chills, constipation, diarrhea, dysuria, fever, flank pain, nausea, painful intercourse, urgency or vomiting. She is sexually active. No, her partner does not have an STD. Her past medical history is significant for a gynecological surgery.    Past Medical History  Diagnosis Date  . Kidney stone   . Gravida 1 para 1    History   Social History  . Marital Status: Married    Spouse Name: N/A  . Number of Children: N/A  . Years of Education: N/A   Occupational History  . Not on file.   Social History Main Topics  . Smoking status: Never Smoker   . Smokeless tobacco: Never Used  . Alcohol Use: 0.0 oz/week    0 Standard drinks or equivalent per week  . Drug Use: No  . Sexual Activity: Not on file   Other Topics Concern  . Not on file   Social History Narrative   Family History  Problem Relation Age of Onset  . Heart disease Mother 1  . Cancer Maternal Grandmother    No current outpatient prescriptions on file prior to visit.   No current facility-administered medications on file prior to visit.    Review of Systems   Constitutional: Negative for fever and chills.  HENT: Negative.   Respiratory: Negative for cough, chest tightness and wheezing.   Cardiovascular: Negative for chest pain and leg swelling.  Gastrointestinal: Negative for nausea, vomiting, abdominal pain, diarrhea and constipation.  Endocrine: Negative.  Negative for cold intolerance, heat intolerance, polydipsia, polyphagia and polyuria.  Genitourinary: Negative for dysuria, urgency, flank pain, vaginal bleeding, vaginal discharge, difficulty urinating, vaginal pain and pelvic pain.  Musculoskeletal: Positive for myalgias (leg cramps at night.).  Neurological: Negative for dizziness, light-headedness and numbness.  Psychiatric/Behavioral: Negative.    Per HPI unless specifically indicated above     Objective:    BP 105/69 mmHg  Pulse 71  Temp(Src) 98.6 F (37 C) (Oral)  Resp 16  Ht  (1.626 m)  Wt 165 lb 6.4 oz (75.025 kg)  BMI 28.38 kg/m2  Wt Readings from Last 3 Encounters:  12/24/14 165 lb 6.4 oz (75.025 kg)  11/22/13 225 lb 8 oz (102.286 kg)  12/17/14 150 lb (68.04 kg)    Physical Exam  Constitutional: She is oriented to person, place, and time. She appears well-developed and well-nourished.  HENT:  Head: Normocephalic and atraumatic.  Neck: Normal range of motion. Neck supple. No thyromegaly present.  Cardiovascular: Normal rate and regular rhythm.  Exam reveals no gallop and no friction rub.   No murmur heard. Pulmonary/Chest: Breath sounds normal. No respiratory distress. She has no wheezes.  Abdominal: Soft. Bowel sounds  are normal. She exhibits no distension. There is no tenderness.  Genitourinary: Vagina normal. No breast swelling, tenderness or discharge. There is lesion (healing boil. ) on the right labia. There is no rash or tenderness on the right labia. There is no rash, tenderness or lesion on the left labia. No tenderness in the vagina.  Uterus, cervix, and adnexa are surgically absent.    Musculoskeletal: Normal range of motion. She exhibits no edema or tenderness.  Lymphadenopathy:    She has no cervical adenopathy.  Neurological: She is alert and oriented to person, place, and time.  Skin: Skin is warm and dry.  Psychiatric: She has a normal mood and affect. Her behavior is normal. Judgment normal.       Assessment & Plan:   Problem List Items Addressed This Visit      Other   Family history of heart disease in female family member before age 33   Relevant Orders   Lipid Profile   Low libido    Likely related to menopause. Lifestyle changes recommeneded. Check LH/FSH to confirm menopause. Discussed HRT as option and family heart hx as a potential barrier.      Relevant Orders   FSH   LH    Other Visit Diagnoses    Well woman exam with routine gynecological exam    -  Primary    Overall doing well. Discussed mammogram recommendations. Card for [facility] given to patient. Health maintenance reviewed.     Leg cramps, sleep related        Flexeril as needed. Trial of magnesium at bedtime to help with cramps. Consider work-up for RLS with no improvement.     Relevant Medications    cyclobenzaprine (FLEXERIL) 10 MG tablet    Magnesium 400 MG TABS    Other Relevant Orders    Comprehensive Metabolic Panel (CMET)       Meds ordered this encounter  Medications  . cyclobenzaprine (FLEXERIL) 10 MG tablet    Sig: Take 1 tablet (10 mg total) by mouth at bedtime.    Dispense:  30 tablet    Refill:  1    Order Specific Question:  Supervising Provider    Answer:  Janeann Forehand [phone]  . Magnesium 400 MG TABS    Sig: Take 1 tablet by mouth at bedtime.      Follow up plan: Return in about 1 year (around 12/24/2015), or if symptoms worsen or fail to improve.

## 2014-12-24 NOTE — Patient Instructions (Addendum)
Leg cramps: Flexeril as needed. Try Magnesium at bedtime to help with cramping. Drink plenty of water.   We will check some basic labs on your today to get a baseline on your health. I am also checking some hormones to confirm that you are in menopause.  This may explain the low libido.   Health Maintenance Adopting a healthy lifestyle and getting preventive care can go a long way to promote health and wellness. Talk with your health care provider about what schedule of regular examinations is right for you. This is a good chance for you to check in with your provider about disease prevention and staying healthy. In between checkups, there are plenty of things you can do on your own. Experts have done a lot of research about which lifestyle changes and preventive measures are most likely to keep you healthy. Ask your health care provider for more information. WEIGHT AND DIET  Eat a healthy diet  Be sure to include plenty of vegetables, fruits, low-fat dairy products, and lean protein.  Do not eat a lot of foods high in solid fats, added sugars, or salt.  Get regular exercise. This is one of the most important things you can do for your health.  Most adults should exercise for at least 150 minutes each week. The exercise should increase your heart rate and make you sweat (moderate-intensity exercise).  Most adults should also do strengthening exercises at least twice a week. This is in addition to the moderate-intensity exercise.  Maintain a healthy weight  Body mass index (BMI) is a measurement that can be used to identify possible weight problems. It estimates body fat based on height and weight. Your health care provider can help determine your BMI and help you achieve or maintain a healthy weight.  For females 57 years of age and older:   A BMI below 18.5 is considered underweight.  A BMI of 18.5 to 24.9 is normal.  A BMI of 25 to 29.9 is considered overweight.  A BMI of 30 and  above is considered obese.  Watch levels of cholesterol and blood lipids  You should start having your blood tested for lipids and cholesterol at 41 years of age, then have this test every 5 years.  You may need to have your cholesterol levels checked more often if:  Your lipid or cholesterol levels are high.  You are older than 41 years of age.  You are at high risk for heart disease.  CANCER SCREENING   Lung Cancer  Lung cancer screening is recommended for adults 80-62 years old who are at high risk for lung cancer because of a history of smoking.  A yearly low-dose CT scan of the lungs is recommended for people who:  Currently smoke.  Have quit within the past 15 years.  Have at least a 30-pack-year history of smoking. A pack year is smoking an average of one pack of cigarettes a day for 1 year.  Yearly screening should continue until it has been 15 years since you quit.  Yearly screening should stop if you develop a health problem that would prevent you from having lung cancer treatment.  Breast Cancer  Practice breast self-awareness. This means understanding how your breasts normally appear and feel.  It also means doing regular breast self-exams. Let your health care provider know about any changes, no matter how small.  If you are in your 20s or 30s, you should have a clinical breast exam (CBE) by a  a health care provider every 1-3 years as part of a regular health exam.  If you are 40 or older, have a CBE every year. Also consider having a breast X-ray (mammogram) every year.  If you have a family history of breast cancer, talk to your health care provider about genetic screening.  If you are at high risk for breast cancer, talk to your health care provider about having an MRI and a mammogram every year.  Breast cancer gene (BRCA) assessment is recommended for women who have family members with BRCA-related cancers. BRCA-related cancers  include:  Breast.  Ovarian.  Tubal.  Peritoneal cancers.  Results of the assessment will determine the need for genetic counseling and BRCA1 and BRCA2 testing. Cervical Cancer Routine pelvic examinations to screen for cervical cancer are no longer recommended for nonpregnant women who are considered low risk for cancer of the pelvic organs (ovaries, uterus, and vagina) and who do not have symptoms. A pelvic examination may be necessary if you have symptoms including those associated with pelvic infections. Ask your health care provider if a screening pelvic exam is right for you.   The Pap test is the screening test for cervical cancer for women who are considered at risk.  If you had a hysterectomy for a problem that was not cancer or a condition that could lead to cancer, then you no longer need Pap tests.  If you are older than 65 years, and you have had normal Pap tests for the past 10 years, you no longer need to have Pap tests.  If you have had past treatment for cervical cancer or a condition that could lead to cancer, you need Pap tests and screening for cancer for at least 20 years after your treatment.  If you no longer get a Pap test, assess your risk factors if they change (such as having a new sexual partner). This can affect whether you should start being screened again.  Some women have medical problems that increase their chance of getting cervical cancer. If this is the case for you, your health care provider may recommend more frequent screening and Pap tests.  The human papillomavirus (HPV) test is another test that may be used for cervical cancer screening. The HPV test looks for the virus that can cause cell changes in the cervix. The cells collected during the Pap test can be tested for HPV.  The HPV test can be used to screen women 30 years of age and older. Getting tested for HPV can extend the interval between normal Pap tests from three to five years.  An HPV  test also should be used to screen women of any age who have unclear Pap test results.  After 41 years of age, women should have HPV testing as often as Pap tests.  Colorectal Cancer  This type of cancer can be detected and often prevented.  Routine colorectal cancer screening usually begins at 41 years of age and continues through 41 years of age.  Your health care provider may recommend screening at an earlier age if you have risk factors for colon cancer.  Your health care provider may also recommend using home test kits to check for hidden blood in the stool.  A small camera at the end of a tube can be used to examine your colon directly (sigmoidoscopy or colonoscopy). This is done to check for the earliest forms of colorectal cancer.  Routine screening usually begins at age 50.  Direct examination   of the colon should be repeated every 5-10 years through 41 years of age. However, you may need to be screened more often if early forms of precancerous polyps or small growths are found. Skin Cancer  Check your skin from head to toe regularly.  Tell your health care provider about any new moles or changes in moles, especially if there is a change in a mole's shape or color.  Also tell your health care provider if you have a mole that is larger than the size of a pencil eraser.  Always use sunscreen. Apply sunscreen liberally and repeatedly throughout the day.  Protect yourself by wearing long sleeves, pants, a wide-brimmed hat, and sunglasses whenever you are outside. HEART DISEASE, DIABETES, AND HIGH BLOOD PRESSURE   Have your blood pressure checked at least every 1-2 years. High blood pressure causes heart disease and increases the risk of stroke.  If you are between 55 years and 79 years old, ask your health care provider if you should take aspirin to prevent strokes.  Have regular diabetes screenings. This involves taking a blood sample to check your fasting blood sugar  level.  If you are at a normal weight and have a low risk for diabetes, have this test once every three years after 41 years of age.  If you are overweight and have a high risk for diabetes, consider being tested at a younger age or more often. PREVENTING INFECTION  Hepatitis B  If you have a higher risk for hepatitis B, you should be screened for this virus. You are considered at high risk for hepatitis B if:  You were born in a country where hepatitis B is common. Ask your health care provider which countries are considered high risk.  Your parents were born in a high-risk country, and you have not been immunized against hepatitis B (hepatitis B vaccine).  You have HIV or AIDS.  You use needles to inject street drugs.  You live with someone who has hepatitis B.  You have had sex with someone who has hepatitis B.  You get hemodialysis treatment.  You take certain medicines for conditions, including cancer, organ transplantation, and autoimmune conditions. Hepatitis C  Blood testing is recommended for:  Everyone born from 1945 through 1965.  Anyone with known risk factors for hepatitis C. Sexually transmitted infections (STIs)  You should be screened for sexually transmitted infections (STIs) including gonorrhea and chlamydia if:  You are sexually active and are younger than 41 years of age.  You are older than 41 years of age and your health care provider tells you that you are at risk for this type of infection.  Your sexual activity has changed since you were last screened and you are at an increased risk for chlamydia or gonorrhea. Ask your health care provider if you are at risk.  If you do not have HIV, but are at risk, it may be recommended that you take a prescription medicine daily to prevent HIV infection. This is called pre-exposure prophylaxis (PrEP). You are considered at risk if:  You are sexually active and do not regularly use condoms or know the HIV status  of your partner(s).  You take drugs by injection.  You are sexually active with a partner who has HIV. Talk with your health care provider about whether you are at high risk of being infected with HIV. If you choose to begin PrEP, you should first be tested for HIV. You should then be tested every   3 months for as long as you are taking PrEP.  PREGNANCY   If you are premenopausal and you may become pregnant, ask your health care provider about preconception counseling.  If you may become pregnant, take 400 to 800 micrograms (mcg) of folic acid every day.  If you want to prevent pregnancy, talk to your health care provider about birth control (contraception). OSTEOPOROSIS AND MENOPAUSE   Osteoporosis is a disease in which the bones lose minerals and strength with aging. This can result in serious bone fractures. Your risk for osteoporosis can be identified using a bone density scan.  If you are 65 years of age or older, or if you are at risk for osteoporosis and fractures, ask your health care provider if you should be screened.  Ask your health care provider whether you should take a calcium or vitamin D supplement to lower your risk for osteoporosis.  Menopause may have certain physical symptoms and risks.  Hormone replacement therapy may reduce some of these symptoms and risks. Talk to your health care provider about whether hormone replacement therapy is right for you.  HOME CARE INSTRUCTIONS   Schedule regular health, dental, and eye exams.  Stay current with your immunizations.   Do not use any tobacco products including cigarettes, chewing tobacco, or electronic cigarettes.  If you are pregnant, do not drink alcohol.  If you are breastfeeding, limit how much and how often you drink alcohol.  Limit alcohol intake to no more than 1 drink per day for nonpregnant women. One drink equals 12 ounces of beer, 5 ounces of wine, or 1 ounces of hard liquor.  Do not use street  drugs.  Do not share needles.  Ask your health care provider for help if you need support or information about quitting drugs.  Tell your health care provider if you often feel depressed.  Tell your health care provider if you have ever been abused or do not feel safe at home. Document Released: 01/10/2011 Document Revised: 11/11/2013 Document Reviewed: 05/29/2013 [employer] Patient Information 2015 [employer]. This information is not intended to replace advice given to you by your health care provider. Make sure you discuss any questions you have with your health care provider.  

## 2014-12-24 NOTE — Assessment & Plan Note (Signed)
Likely related to menopause. Lifestyle changes recommeneded. Check LH/FSH to confirm menopause. Discussed HRT as option and family heart hx as a potential barrier.

## 2014-12-24 NOTE — Telephone Encounter (Signed)
Spoke to husband who is requesting that the pt is to be able to visit another lab for blood work. Pt was sent to [facility] on [address] in [place] and is having issues with insurance and customer service.  Call back number: [phone]

## 2014-12-25 NOTE — Telephone Encounter (Signed)
Left detailed message after calling twice that it's okay to get lab done in different lab other than [facility].

## 2015-01-01 ENCOUNTER — Encounter: Payer: Self-pay | Admitting: Family Medicine

## 2015-01-29 ENCOUNTER — Encounter: Payer: Self-pay | Admitting: Emergency Medicine

## 2015-01-29 ENCOUNTER — Emergency Department
Admission: EM | Admit: 2015-01-29 | Discharge: 2015-01-29 | Disposition: A | Discharge status: Home / Self Care | Payer: BLUE CROSS/BLUE SHIELD | Attending: Student | Admitting: Student

## 2015-01-29 DIAGNOSIS — M545 Low back pain, unspecified: Secondary | ICD-10-CM

## 2015-01-29 DIAGNOSIS — G8929 Other chronic pain: Secondary | ICD-10-CM | POA: Diagnosis not present

## 2015-01-29 DIAGNOSIS — Z79899 Other long term (current) drug therapy: Secondary | ICD-10-CM | POA: Insufficient documentation

## 2015-01-29 MED ORDER — PREDNISONE 20 MG PO TABS
60.0000 mg | ORAL_TABLET | Freq: Once | ORAL | Status: AC
  Administered 2015-01-29: 60 mg via ORAL
  Filled 2015-01-29: qty 3

## 2015-01-29 MED ORDER — TRAMADOL HCL 50 MG PO TABS
50.0000 mg | ORAL_TABLET | Freq: Once | ORAL | Status: AC
  Administered 2015-01-29: 50 mg via ORAL
  Filled 2015-01-29: qty 1

## 2015-01-29 MED ORDER — TRAMADOL HCL 50 MG PO TABS: 50.0000 mg | ORAL_TABLET | Freq: Three times a day (TID) | ORAL | Status: DC | PRN

## 2015-01-29 MED ORDER — PREDNISONE 20 MG PO TABS: 60.0000 mg | ORAL_TABLET | Freq: Every day | ORAL | Status: DC

## 2015-01-29 MED ORDER — KETOROLAC TROMETHAMINE 60 MG/2ML IM SOLN
60.0000 mg | Freq: Once | INTRAMUSCULAR | Status: AC
  Administered 2015-01-29: 60 mg via INTRAMUSCULAR
  Filled 2015-01-29: qty 2

## 2015-01-29 NOTE — ED Notes (Signed)
Pt reports hx of 2 back surgeries.  States she lifted something she "shouldn't have".  Left side back pain radiating down her leg. Skin w/d. PERRL.

## 2015-01-29 NOTE — ED Provider Notes (Signed)
[facility] Emergency Department Provider Note  ____________________________________________  Time seen: Approximately 7:02 AM  I have reviewed the triage vital signs and the nursing notes.   HISTORY  Chief Complaint Back Pain    HPI Katelyn Holland is a 39 y.o. female with history of degenerative disc disease status post 2 lumbar operations who presents for evaluation of 2 days left lumbar back pain with spasm, gradual onset, constant since onset. Patient reports that she was lifting boxes 2 days ago and after that she developed left lumbar back pain. She has had this pain in the past and has been responsive to steroids and pain medications. Current severity of symptoms is 9 out of 10. Movement/ambulation makes the pain worse. No bowel or bladder incontinence, no falls or other recent trauma, no history of malignancy, no fever, no numbness or weakness in the legs, no history of IV drug use. Has been using Flexeril however that has not been helpful for her pain.   Past Medical History  Diagnosis Date  . Kidney stone   . Gravida 1 para 1     Patient Active Problem List   Diagnosis Date Noted  . Chronic back pain 12/24/2014  . Family history of heart disease in female family member before age 34 12/24/2014  . Low libido 12/24/2014    Past Surgical History  Procedure Laterality Date  . Back surgery    . Abdominal hysterectomy    . Laparoscopic gastric sleeve resection    . Kidney stone surgery      Current Outpatient Rx  Name  Route  Sig  Dispense  Refill  . cyclobenzaprine (FLEXERIL) 10 MG tablet   Oral   Take 1 tablet (10 mg total) by mouth at bedtime.   30 tablet   1   . acyclovir (ZOVIRAX) 400 MG tablet   Oral   Take 1 tablet by mouth 2 (two) times daily.         . Magnesium 400 MG TABS   Oral   Take 1 tablet by mouth at bedtime.         . ondansetron (ZOFRAN ODT) 4 MG disintegrating tablet   Oral   Take 1 tablet by mouth 4 (four)  times daily as needed.           Allergies Iodinated diagnostic agents  Family History  Problem Relation Age of Onset  . Heart disease Mother 64  . Cancer Maternal Grandmother     Social History History  Substance Use Topics  . Smoking status: Never Smoker   . Smokeless tobacco: Never Used  . Alcohol Use: 0.0 oz/week    0 Standard drinks or equivalent per week    Review of Systems Constitutional: No fever/chills Eyes: No visual changes. ENT: No sore throat. Cardiovascular: Denies chest pain. Respiratory: Denies shortness of breath. Gastrointestinal: No abdominal pain.  No nausea, no vomiting.  No diarrhea.  No constipation. Genitourinary: Negative for dysuria. Musculoskeletal: Positive for back pain. Skin: Negative for rash. Neurological: Negative for headaches, focal weakness or numbness.  10-point ROS otherwise negative.  ____________________________________________   PHYSICAL EXAM:  VITAL SIGNS: ED Triage Vitals  Enc Vitals Group     BP 01/29/15 0514 111/72 mmHg     Pulse Rate 01/29/15 0514 88     Resp 01/29/15 0514 20     Temp 01/29/15 0514 98.2 F (36.8 C)     Temp Source 01/29/15 0514 Oral     SpO2 01/29/15 0514  99 %     Weight 01/29/15 0514 140 lb (63.504 kg)     Height 01/29/15 0514 5\' 5"  (1.651 m)     Head Cir --      Peak Flow --      Pain Score 01/29/15 0514 4     Pain Loc --      Pain Edu? --      Excl. in GC? --     Constitutional: Alert and oriented. Well appearing and in no acute distress generally but she does grimace with movement/rotation at the torso. Eyes: Conjunctivae are normal. PERRL. EOMI. Head: Atraumatic. Nose: No congestion/rhinnorhea. Mouth/Throat: Mucous membranes are moist.  Oropharynx non-erythematous. Neck: No stridor.   Cardiovascular: Normal rate, regular rhythm. Grossly normal heart sounds.  Good peripheral circulation. Respiratory: Normal respiratory effort.  No retractions. Lungs CTAB. Gastrointestinal: Soft  and nontender. No distention. No abdominal bruits. No CVA tenderness. Genitourinary: deferred Musculoskeletal: No lower extremity tenderness nor edema.  No joint effusions. Mild tenderness to palpation throughout the paravertebral muscles in the left lumbar spine, no midline tenderness to palpation, 5 out of 5 strength in bilateral lower extremities, sensation intact to light touch, strong dorsiflexion of bilateral big toes. Neurologic:  Normal speech and language. No gross focal neurologic deficits are appreciated. No gait instability. Skin:  Skin is warm, dry and intact. No rash noted. Psychiatric: Mood and affect are normal. Speech and behavior are normal.  ____________________________________________   LABS (all labs ordered are listed, but only abnormal results are displayed)  Labs Reviewed - No data to display ____________________________________________  EKG  none ____________________________________________  RADIOLOGY  none ____________________________________________   PROCEDURES  Procedure(s) performed: None  Critical Care performed: No  ____________________________________________   INITIAL IMPRESSION / ASSESSMENT AND PLAN / ED COURSE  Pertinent labs & imaging results that were available during my care of the patient were reviewed by me and considered in my medical decision making (see chart for details).  Katelyn Holland is a 39 y.o. female with history of degenerative disc disease status post 2 lumbar operations who presents for evaluation of 2 days left lumbar back pain with spasm. Exam consistent with muscular skeletal back pain. She is neurologically intact. No red flags concerning for cauda equina or epidural abscess. Symptoms improved with Toradol at this point. We'll discharge with Ultram, prednisone and she'll follow-up with her primary care doctor as well as her spine doctor. Return precautions discussed and patient and husband at bedside are comfortable  with the discharge plan. ____________________________________________   FINAL CLINICAL IMPRESSION(S) / ED DIAGNOSES  Final diagnoses:  Left-sided low back pain without sciatica      Gayla Doss, MD 01/29/15 [phone]

## 2015-01-29 NOTE — ED Notes (Signed)
Patient ambulatory to triage with steady gait, without difficulty or distress noted; pt reports left lower back pain, leg spams x 2 days; taking muscle relaxers rx by PCP with some relief; st hx of same; denies any known recent injury; st pain increases with movement; denies any accomp symptoms

## 2015-02-12 ENCOUNTER — Telehealth: Payer: Self-pay | Admitting: Family Medicine

## 2015-02-12 DIAGNOSIS — G4762 Sleep related leg cramps: Secondary | ICD-10-CM

## 2015-02-12 MED ORDER — CYCLOBENZAPRINE HCL 10 MG PO TABS: 10.0000 mg | ORAL_TABLET | Freq: Every day | ORAL | Status: DC

## 2015-02-12 NOTE — Telephone Encounter (Signed)
I saw you had  Rx on 06/15 with quantity 30 does she need appointment or we can refill or can I denied please suggest?

## 2015-02-12 NOTE — Telephone Encounter (Signed)
That's okay to refill. I will send it to [facility].

## 2015-02-12 NOTE — Telephone Encounter (Signed)
Pt. Husband called pt need a refill on  Flexeril, [facility] In [place]

## 2015-04-09 ENCOUNTER — Emergency Department
Admission: EM | Admit: 2015-04-09 | Discharge: 2015-04-09 | Disposition: A | Discharge status: Home / Self Care | Payer: BLUE CROSS/BLUE SHIELD | Attending: Emergency Medicine | Admitting: Emergency Medicine

## 2015-04-09 DIAGNOSIS — Z79899 Other long term (current) drug therapy: Secondary | ICD-10-CM | POA: Insufficient documentation

## 2015-04-09 DIAGNOSIS — N200 Calculus of kidney: Secondary | ICD-10-CM | POA: Insufficient documentation

## 2015-04-09 LAB — COMPREHENSIVE METABOLIC PANEL
ALBUMIN: 4.5 g/dL (ref 3.5–5.0)
ALK PHOS: 66 U/L (ref 38–126)
ALT: 13 U/L — ABNORMAL LOW (ref 14–54)
ANION GAP: 6 (ref 5–15)
AST: 18 U/L (ref 15–41)
BUN: 13 mg/dL (ref 6–20)
CO2: 27 mmol/L (ref 22–32)
Calcium: 9 mg/dL (ref 8.9–10.3)
Chloride: 103 mmol/L (ref 101–111)
Creatinine, Ser: 0.53 mg/dL (ref 0.44–1.00)
GFR calc Af Amer: >60 mL/min (ref >60)
GFR calc non Af Amer: >60 mL/min (ref >60)
GLUCOSE: 88 mg/dL (ref 65–99)
Potassium: 3.4 mmol/L — ABNORMAL LOW (ref 3.5–5.1)
SODIUM: 136 mmol/L (ref 135–145)
Total Bilirubin: 0.8 mg/dL (ref 0.3–1.2)
Total Protein: 7.2 g/dL (ref 6.5–8.1)

## 2015-04-09 LAB — CBC WITH DIFFERENTIAL/PLATELET
BASOS ABS: 0.1 10*3/uL (ref 0–0.1)
BASOS PCT: 1 %
EOS ABS: 0 10*3/uL (ref 0–0.7)
Eosinophils Relative: 1 %
HCT: 36.2 % (ref 35.0–47.0)
Hemoglobin: 12 g/dL (ref 12.0–16.0)
Lymphocytes Relative: 36 %
Lymphs Abs: 2.3 10*3/uL (ref 1.0–3.6)
MCH: 28 pg (ref 26.0–34.0)
MCHC: 33.3 g/dL (ref 32.0–36.0)
MCV: 84.3 fL (ref 80.0–100.0)
Monocytes Absolute: 0.2 10*3/uL (ref 0.2–0.9)
Monocytes Relative: 4 %
NEUTROS ABS: 3.8 10*3/uL (ref 1.4–6.5)
Neutrophils Relative %: 58 %
PLATELETS: 184 10*3/uL (ref 150–440)
RBC: 4.3 MIL/uL (ref 3.80–5.20)
RDW: 12.4 % (ref 11.5–14.5)
WBC: 6.4 10*3/uL (ref 3.6–11.0)

## 2015-04-09 LAB — URINALYSIS COMPLETE WITH MICROSCOPIC (ARMC ONLY)
BACTERIA UA: NONE SEEN
Bilirubin Urine: NEGATIVE
Glucose, UA: NEGATIVE mg/dL
KETONES UR: NEGATIVE mg/dL
Nitrite: NEGATIVE
PH: 5 (ref 5.0–8.0)
PROTEIN: 30 mg/dL — AB
SPECIFIC GRAVITY, URINE: 1.021 (ref 1.005–1.030)

## 2015-04-09 MED ORDER — TAMSULOSIN HCL 0.4 MG PO CAPS: 0.4000 mg | ORAL_CAPSULE | Freq: Every day | ORAL | Status: DC

## 2015-04-09 MED ORDER — OXYCODONE-ACETAMINOPHEN 5-325 MG PO TABS
1.0000 | ORAL_TABLET | Freq: Once | ORAL | Status: AC
  Administered 2015-04-09: 1 via ORAL
  Filled 2015-04-09: qty 1

## 2015-04-09 MED ORDER — ONDANSETRON HCL 4 MG PO TABS: 4.0000 mg | ORAL_TABLET | Freq: Every day | ORAL | Status: DC | PRN

## 2015-04-09 MED ORDER — CEPHALEXIN 500 MG PO CAPS: 500.0000 mg | ORAL_CAPSULE | Freq: Three times a day (TID) | ORAL | Status: DC

## 2015-04-09 MED ORDER — OXYCODONE-ACETAMINOPHEN 5-325 MG PO TABS: 1.0000 | ORAL_TABLET | ORAL | Status: DC | PRN

## 2015-04-09 MED ORDER — KETOROLAC TROMETHAMINE 30 MG/ML IJ SOLN
30.0000 mg | Freq: Once | INTRAMUSCULAR | Status: AC
  Administered 2015-04-09: 30 mg via INTRAVENOUS
  Filled 2015-04-09: qty 1

## 2015-04-09 NOTE — ED Provider Notes (Signed)
[facility] Emergency Department Provider Note    ____________________________________________  Time seen: 1745  I have reviewed the triage vital signs and the nursing notes.   HISTORY  Chief Complaint Flank Pain   History limited by: Not Limited   HPI [name] is a 39 y.o. female with history of kidney stones who presents to the emergency department today with left-sided flank pain. Patient states this pain is been going on for 2 days. She states that it is severe. It is located in the left flank with some radiation into the groin. She states it does feel like her previous kidney stones. She has not had any nausea or vomiting. The patient states that she does think she has passed a couple small stones. She does have one stone here with her. She denies any recent fevers. States it is been a number of years since her previous kidney stone.  Past Medical History  Diagnosis Date  . Kidney stone   . Gravida 1 para 1     Patient Active Problem List   Diagnosis Date Noted  . Chronic back pain 12/24/2014  . Family history of heart disease in female family member before age 35 12/24/2014  . Low libido 12/24/2014    Past Surgical History  Procedure Laterality Date  . Back surgery    . Abdominal hysterectomy    . Laparoscopic gastric sleeve resection    . Kidney stone surgery      Current Outpatient Rx  Name  Route  Sig  Dispense  Refill  . acyclovir (ZOVIRAX) 400 MG tablet   Oral   Take 1 tablet by mouth 2 (two) times daily.         . cyclobenzaprine (FLEXERIL) 10 MG tablet   Oral   Take 1 tablet (10 mg total) by mouth at bedtime.   30 tablet   3   . Magnesium 400 MG TABS   Oral   Take 1 tablet by mouth at bedtime.         . ondansetron (ZOFRAN ODT) 4 MG disintegrating tablet   Oral   Take 1 tablet by mouth 4 (four) times daily as needed.         . predniSONE (DELTASONE) 20 MG tablet   Oral   Take 3 tablets (60 mg total) by  mouth daily. Filled today and start tomorrow. You received you first dose of this medication in the ER today.   12 tablet   0   . traMADol (ULTRAM) 50 MG tablet   Oral   Take 1 tablet (50 mg total) by mouth every 8 (eight) hours as needed for moderate pain.   15 tablet   0     Allergies Iodinated diagnostic agents  Family History  Problem Relation Age of Onset  . Heart disease Mother 23  . Cancer Maternal Grandmother     Social History Social History  Substance Use Topics  . Smoking status: Never Smoker   . Smokeless tobacco: Never Used  . Alcohol Use: 0.0 oz/week    0 Standard drinks or equivalent per week    Review of Systems  Constitutional: Negative for fever. Cardiovascular: Negative for chest pain. Respiratory: Negative for shortness of breath. Gastrointestinal: Positive for left-sided abdominal pain Genitourinary: Negative for dysuria. Musculoskeletal: Negative for back pain. Skin: Negative for rash. Neurological: Negative for headaches, focal weakness or numbness.  10-point ROS otherwise negative.  ____________________________________________   PHYSICAL EXAM:  VITAL SIGNS: ED Triage  Vitals  Enc Vitals Group     BP 04/09/15 1646 119/73 mmHg     Pulse Rate 04/09/15 1646 72     Resp 04/09/15 1646 18     Temp 04/09/15 1646 98.5 F (36.9 C)     Temp Source 04/09/15 1646 Oral     SpO2 04/09/15 1646 99 %     Weight 04/09/15 1646 130 lb (58.968 kg)     Height 04/09/15 1646  (1.626 m)   Constitutional: Alert and oriented. Well appearing and in no distress. Eyes: Conjunctivae are normal. PERRL. Normal extraocular movements. ENT   Head: Normocephalic and atraumatic.   Nose: No congestion/rhinnorhea.   Mouth/Throat: Mucous membranes are moist.   Neck: No stridor. Hematological/Lymphatic/Immunilogical: No cervical lymphadenopathy. Cardiovascular: Normal rate, regular rhythm.  No murmurs, rubs, or gallops. Respiratory: Normal  respiratory effort without tachypnea nor retractions. Breath sounds are clear and equal bilaterally. No wheezes/rales/rhonchi. Gastrointestinal: Soft and nontender. No distention. There is no CVA tenderness. Genitourinary: Deferred Musculoskeletal: Normal range of motion in all extremities. No joint effusions.  No lower extremity tenderness nor edema. Neurologic:  Normal speech and language. No gross focal neurologic deficits are appreciated. Speech is normal.  Skin:  Skin is warm, dry and intact. No rash noted. Psychiatric: Mood and affect are normal. Speech and behavior are normal. Patient exhibits appropriate insight and judgment.  ____________________________________________    LABS (pertinent positives/negatives)  Labs Reviewed  COMPREHENSIVE METABOLIC PANEL - Abnormal; Notable for the following:    Potassium 3.4 (*)    ALT 13 (*)    All other components within normal limits  URINALYSIS COMPLETEWITH MICROSCOPIC ([facility] ONLY) - Abnormal; Notable for the following:    Color, Urine YELLOW (*)    APPearance CLOUDY (*)    Hgb urine dipstick 1+ (*)    Protein, ur 30 (*)    Leukocytes, UA 1+ (*)    Squamous Epithelial / LPF TOO NUMEROUS TO COUNT (*)    All other components within normal limits  CBC WITH DIFFERENTIAL/PLATELET     ____________________________________________   EKG  None  ____________________________________________    RADIOLOGY  Bedside ultrasound with moderate hydronephrosis on the left side. Bilateral ureteral jets. ____________________________________________   PROCEDURES  Procedure(s) performed: None  Critical Care performed: No  ____________________________________________   INITIAL IMPRESSION / ASSESSMENT AND PLAN / ED COURSE  Pertinent labs & imaging results that were available during my care of the patient were reviewed by me and considered in my medical decision making (see chart for details).  Patient presents with left-sided flank  pain concerning for kidney stone. Patient does have red blood cells as well as white blood cells in the urine. The patient's clinical presentation is consistent with kidney stones. Will discharge home with analgesics, antiemetics and Flomax and antibiotics. Advised patient to follow up with urology  ____________________________________________   FINAL CLINICAL IMPRESSION(S) / ED DIAGNOSES  Final diagnoses:  Kidney stone     Phineas Semen, MD 04/09/15 2321

## 2015-04-09 NOTE — ED Notes (Signed)
Patient comes in complaining of left flank pain with history of kidney stones.  Patent also presents with "kidney stone specimen" in plastic gum container.

## 2015-04-09 NOTE — Discharge Instructions (Signed)
Please seek medical attention for any high fevers, chest pain, shortness of breath, change in behavior, persistent vomiting, bloody stool or any other new or concerning symptoms. ° ° °Kidney Stones °Kidney stones (urolithiasis) are deposits that form inside your kidneys. The intense pain is caused by the stone moving through the urinary tract. When the stone moves, the ureter goes into spasm around the stone. The stone is usually passed in the urine.  °CAUSES  °· A disorder that makes certain neck glands produce too much parathyroid hormone (primary hyperparathyroidism). °· A buildup of uric acid crystals, similar to gout in your joints. °· Narrowing (stricture) of the ureter. °· A kidney obstruction present at birth (congenital obstruction). °· Previous surgery on the kidney or ureters. °· Numerous kidney infections. °SYMPTOMS  °· Feeling sick to your stomach (nauseous). °· Throwing up (vomiting). °· Blood in the urine (hematuria). °· Pain that usually spreads (radiates) to the groin. °· Frequency or urgency of urination. °DIAGNOSIS  °· Taking a history and physical exam. °· Blood or urine tests. °· CT scan. °· Occasionally, an examination of the inside of the urinary bladder (cystoscopy) is performed. °TREATMENT  °· Observation. °· Increasing your fluid intake. °· Extracorporeal shock wave lithotripsy--This is a noninvasive procedure that uses shock waves to break up kidney stones. °· Surgery may be needed if you have severe pain or persistent obstruction. There are various surgical procedures. Most of the procedures are performed with the use of small instruments. Only small incisions are needed to accommodate these instruments, so recovery time is minimized. °The size, location, and chemical composition are all important variables that will determine the proper choice of action for you. Talk to your health care provider to better understand your situation so that you will minimize the risk of injury to yourself  and your kidney.  °HOME CARE INSTRUCTIONS  °· Drink enough water and fluids to keep your urine clear or pale yellow. This will help you to pass the stone or stone fragments. °· Strain all urine through the provided strainer. Keep all particulate matter and stones for your health care provider to see. The stone causing the pain may be as small as a grain of salt. It is very important to use the strainer each and every time you pass your urine. The collection of your stone will allow your health care provider to analyze it and verify that a stone has actually passed. The stone analysis will often identify what you can do to reduce the incidence of recurrences. °· Only take over-the-counter or prescription medicines for pain, discomfort, or fever as directed by your health care provider. °· Make a follow-up appointment with your health care provider as directed. °· Get follow-up X-rays if required. The absence of pain does not always mean that the stone has passed. It may have only stopped moving. If the urine remains completely obstructed, it can cause loss of kidney function or even complete destruction of the kidney. It is your responsibility to make sure X-rays and follow-ups are completed. Ultrasounds of the kidney can show blockages and the status of the kidney. Ultrasounds are not associated with any radiation and can be performed easily in a matter of minutes. °SEEK MEDICAL CARE IF: °· You experience pain that is progressive and unresponsive to any pain medicine you have been prescribed. °SEEK IMMEDIATE MEDICAL CARE IF:  °· Pain cannot be controlled with the prescribed medicine. °· You have a fever or shaking chills. °· The severity or intensity   of pain increases over 18 hours and is not relieved by pain medicine. °· You develop a new onset of abdominal pain. °· You feel faint or pass out. °· You are unable to urinate. °MAKE SURE YOU:  °· Understand these instructions. °· Will watch your condition. °· Will get  help right away if you are not doing well or get worse. °Document Released: 06/27/2005 Document Revised: 02/27/2013 Document Reviewed: 11/28/2012 °[employer]® Patient Information ©2015 [employer]. This information is not intended to replace advice given to you by your health care provider. Make sure you discuss any questions you have with your health care provider. ° °

## 2015-05-20 ENCOUNTER — Other Ambulatory Visit: Payer: Self-pay | Admitting: Family Medicine

## 2015-05-20 DIAGNOSIS — G4762 Sleep related leg cramps: Secondary | ICD-10-CM

## 2015-05-20 NOTE — Telephone Encounter (Signed)
Pt needs refills for acyclovir and [facility].

## 2015-05-21 MED ORDER — CYCLOBENZAPRINE HCL 10 MG PO TABS: 10.0000 mg | ORAL_TABLET | Freq: Every day | ORAL | Status: DC

## 2015-05-21 MED ORDER — ACYCLOVIR 400 MG PO TABS: 400.0000 mg | ORAL_TABLET | Freq: Two times a day (BID) | ORAL | Status: DC

## 2015-05-21 NOTE — Telephone Encounter (Signed)
rx submitted.  

## 2015-05-21 NOTE — Telephone Encounter (Signed)
Done

## 2015-06-08 ENCOUNTER — Encounter: Payer: Self-pay | Admitting: Medical Oncology

## 2015-06-08 ENCOUNTER — Emergency Department: Payer: BLUE CROSS/BLUE SHIELD

## 2015-06-08 ENCOUNTER — Emergency Department
Admission: EM | Admit: 2015-06-08 | Discharge: 2015-06-08 | Disposition: A | Discharge status: Home / Self Care | Payer: BLUE CROSS/BLUE SHIELD | Attending: Emergency Medicine | Admitting: Emergency Medicine

## 2015-06-08 DIAGNOSIS — Z792 Long term (current) use of antibiotics: Secondary | ICD-10-CM | POA: Insufficient documentation

## 2015-06-08 DIAGNOSIS — Z79899 Other long term (current) drug therapy: Secondary | ICD-10-CM | POA: Insufficient documentation

## 2015-06-08 DIAGNOSIS — Z7952 Long term (current) use of systemic steroids: Secondary | ICD-10-CM | POA: Insufficient documentation

## 2015-06-08 DIAGNOSIS — K5900 Constipation, unspecified: Secondary | ICD-10-CM | POA: Insufficient documentation

## 2015-06-08 HISTORY — DX: Other constipation: K59.09

## 2015-06-08 MED ORDER — LACTULOSE 10 GM/15ML PO SOLN
10.0000 g | Freq: Once | ORAL | Status: AC
  Administered 2015-06-08: 10 g via ORAL
  Filled 2015-06-08: qty 30

## 2015-06-08 MED ORDER — DOCUSATE SODIUM 100 MG PO CAPS
100.0000 mg | ORAL_CAPSULE | Freq: Once | ORAL | Status: AC
Start: 2015-06-08 — End: 2015-06-08
  Administered 2015-06-08: 100 mg via ORAL
  Filled 2015-06-08: qty 1

## 2015-06-08 MED ORDER — MAGNESIUM CITRATE PO SOLN
1.0000 | Freq: Once | ORAL | Status: AC
  Administered 2015-06-08: 1
  Filled 2015-06-08: qty 296

## 2015-06-08 NOTE — ED Notes (Signed)
Pt reports abd cramping x 2 days but reports she has not had a BM in a week and a day. Reports taking exlax and doing an enema without results.

## 2015-06-08 NOTE — ED Notes (Signed)
NAD noted at time of D/C. Pt denies questions or concerns. Pt ambulatory to the lobby at this time.  

## 2015-06-08 NOTE — ED Notes (Signed)
Pt continues to be sitting on toilet having bowel movement. States she feels much better at this time. NAD Noted at this time.

## 2015-06-08 NOTE — ED Notes (Signed)
Soap suds enema administered at this time. Pt tolerating well. NAD noted at this time.

## 2015-06-08 NOTE — Discharge Instructions (Signed)

## 2015-06-08 NOTE — ED Provider Notes (Signed)
[facility] Emergency Department Provider Note  ____________________________________________  Time seen: Approximately 7:20 AM  I have reviewed the triage vital signs and the nursing notes.   HISTORY  Chief Complaint Constipation and Abdominal Pain    HPI Azula Raiford NobleM Johann is a 39 y.o. 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 The patient says that ever since her surgery she has been dealing with chronic constipation. She says that she usually moves her bowels once a week. Because of this she takes a enema each Sunday with Ex-Lax that produces a bowel movement. However, she was not able to produce a bowel movement after taking his regimen yesterday. Also since taking the Ex-Lax she has been experiencing upper abdominal cramping. She does say that she is passing gas. Denies any vomiting or blood in her stool.She had her gastric surgery done by [place]. [place]. She describes the pain as upper abdominal and cramping.   Past Medical History  Diagnosis Date  . Kidney stone   . Gravida 1 para 1   . Chronic constipation     Patient Active Problem List   Diagnosis Date Noted  . Chronic back pain 12/24/2014  . Family history of heart disease in female family member before age 41 12/24/2014  . Low libido 12/24/2014    Past Surgical History  Procedure Laterality Date  . Back surgery    . Abdominal hysterectomy    . Laparoscopic gastric sleeve resection    . Kidney stone surgery      Current Outpatient Rx  Name  Route  Sig  Dispense  Refill  . acyclovir (ZOVIRAX) 400 MG tablet   Oral   Take 1 tablet (400 mg total) by mouth 2 (two) times daily.   20 tablet   5   . cyclobenzaprine (FLEXERIL) 10 MG tablet   Oral   Take 1 tablet (10 mg total) by mouth at bedtime.   30 tablet   3   . cephALEXin (KEFLEX) 500 MG capsule   Oral   Take 1 capsule (500 mg total) by  mouth 3 (three) times daily.   30 capsule   0   . Magnesium 400 MG TABS   Oral   Take 1 tablet by mouth at bedtime.         . ondansetron (ZOFRAN) 4 MG tablet   Oral   Take 1 tablet (4 mg total) by mouth daily as needed for nausea or vomiting.   20 tablet   0   . oxyCODONE-acetaminophen (ROXICET) 5-325 MG tablet   Oral   Take 1 tablet by mouth every 4 (four) hours as needed for severe pain.   15 tablet   0   . predniSONE (DELTASONE) 20 MG tablet   Oral   Take 3 tablets (60 mg total) by mouth daily. Filled today and start tomorrow. You received you first dose of this medication in the ER today.   12 tablet   0   . tamsulosin (FLOMAX) 0.4 MG CAPS capsule   Oral   Take 1 capsule (0.4 mg total) by mouth daily.   14 capsule   0   . traMADol (ULTRAM) 50 MG tablet   Oral   Take 1 tablet (50 mg total) by mouth every 8 (eight) hours as needed for moderate pain.   15 tablet   0  Allergies Iodinated diagnostic agents  Family History  Problem Relation Age of Onset  . Heart disease Mother 32  . Cancer Maternal Grandmother     Social History Social History  Substance Use Topics  . Smoking status: Never Smoker   . Smokeless tobacco: Never Used  . Alcohol Use: 0.0 oz/week    0 Standard drinks or equivalent per week     Comment: occ    Review of Systems Constitutional: No fever/chills Eyes: No visual changes. ENT: No sore throat. Cardiovascular: Denies chest pain. Respiratory: Denies shortness of breath. Gastrointestinal: No nausea, no vomiting.  No diarrhea.  Genitourinary: Negative for dysuria. Musculoskeletal: Negative for back pain. Skin: Negative for rash. Neurological: Negative for headaches, focal weakness or numbness.  10-point ROS otherwise negative.  ____________________________________________   PHYSICAL EXAM:  VITAL SIGNS: ED Triage Vitals  Enc Vitals Group     BP 06/08/15 0702 115/87 mmHg     Pulse Rate 06/08/15 0702 107     Resp  06/08/15 0702 18     Temp 06/08/15 0702 97.7 F (36.5 C)     Temp Source 06/08/15 0702 Oral     SpO2 06/08/15 0702 100 %     Weight 06/08/15 0702 135 lb (61.236 kg)     Height 06/08/15 0702  (1.626 m)     Head Cir --      Peak Flow --      Pain Score 06/08/15 0702 4     Pain Loc --      Pain Edu? --      Excl. in GC? --     Constitutional: Alert and oriented. in no acute distress. Eyes: Conjunctivae are normal. PERRL. EOMI. Head: Atraumatic. Nose: No congestion/rhinnorhea. Mouth/Throat: Mucous membranes are moist.  Oropharynx non-erythematous. Neck: No stridor.   Cardiovascular: Normal rate, regular rhythm. Grossly normal heart sounds.  Good peripheral circulation. Respiratory: Normal respiratory effort.  No retractions. Lungs CTAB. Gastrointestinal: Soft with left upper quadrant tenderness to palpation. No rebound or guarding. No distention. No abdominal bruits. No CVA tenderness. Rectal exam with a small amount of brown stool on the glove. No gross blood. No fecal impaction. Musculoskeletal: No lower extremity tenderness nor edema.  No joint effusions. Neurologic:  Normal speech and language. No gross focal neurologic deficits are appreciated. No gait instability. Skin:  Skin is warm, dry and intact. No rash noted. Psychiatric: Mood and affect are normal. Speech and behavior are normal.  ____________________________________________   LABS (all labs ordered are listed, but only abnormal results are displayed)  Labs Reviewed - No data to display ____________________________________________  EKG   ____________________________________________  RADIOLOGY  X-ray of the abdomen with constipation. I personally reviewed these images. No obstruction or obvious from this x-ray. ____________________________________________   PROCEDURES    ____________________________________________   INITIAL IMPRESSION / ASSESSMENT AND PLAN / ED COURSE  Pertinent labs & imaging  results that were available during my care of the patient were reviewed by me and considered in my medical decision making (see chart for details).  ----------------------------------------- 9:28 AM on 06/08/2015 -----------------------------------------  Patient had a large bowel movement after the enema. She is feeling relieved at this time without any abdominal pain. I reexamined her abdomen which is now soft and nontender. She'll be discharged home. ____________________________________________   FINAL CLINICAL IMPRESSION(S) / ED DIAGNOSES  Acute on chronic constipation.    Myrna Blazer, MD 06/08/15 [phone]

## 2015-06-08 NOTE — ED Notes (Signed)
Pt sitting on toilet at this time. NAD noted. Pt states she feels better and is successful in moving her bowels.

## 2015-07-07 ENCOUNTER — Inpatient Hospital Stay
Admission: EM | Admit: 2015-07-07 | Discharge: 2015-07-09 | DRG: 690 | Disposition: A | Discharge status: Home / Self Care | Payer: Self-pay | Attending: Internal Medicine | Admitting: Internal Medicine

## 2015-07-07 ENCOUNTER — Encounter: Payer: Self-pay | Admitting: Emergency Medicine

## 2015-07-07 ENCOUNTER — Emergency Department: Payer: Self-pay

## 2015-07-07 DIAGNOSIS — I959 Hypotension, unspecified: Secondary | ICD-10-CM | POA: Diagnosis present

## 2015-07-07 DIAGNOSIS — R10A0 Flank pain, unspecified side: Secondary | ICD-10-CM

## 2015-07-07 DIAGNOSIS — D649 Anemia, unspecified: Secondary | ICD-10-CM

## 2015-07-07 DIAGNOSIS — Z79899 Other long term (current) drug therapy: Secondary | ICD-10-CM

## 2015-07-07 DIAGNOSIS — E876 Hypokalemia: Secondary | ICD-10-CM

## 2015-07-07 DIAGNOSIS — K5909 Other constipation: Secondary | ICD-10-CM | POA: Diagnosis present

## 2015-07-07 DIAGNOSIS — Z8249 Family history of ischemic heart disease and other diseases of the circulatory system: Secondary | ICD-10-CM

## 2015-07-07 DIAGNOSIS — R109 Unspecified abdominal pain: Secondary | ICD-10-CM

## 2015-07-07 DIAGNOSIS — Z87440 Personal history of urinary (tract) infections: Secondary | ICD-10-CM

## 2015-07-07 DIAGNOSIS — Z87442 Personal history of urinary calculi: Secondary | ICD-10-CM

## 2015-07-07 DIAGNOSIS — Z9884 Bariatric surgery status: Secondary | ICD-10-CM

## 2015-07-07 DIAGNOSIS — N136 Pyonephrosis: Principal | ICD-10-CM | POA: Diagnosis present

## 2015-07-07 DIAGNOSIS — N1330 Unspecified hydronephrosis: Secondary | ICD-10-CM

## 2015-07-07 DIAGNOSIS — N200 Calculus of kidney: Secondary | ICD-10-CM

## 2015-07-07 DIAGNOSIS — B9620 Unspecified Escherichia coli [E. coli] as the cause of diseases classified elsewhere: Secondary | ICD-10-CM | POA: Diagnosis present

## 2015-07-07 DIAGNOSIS — N12 Tubulo-interstitial nephritis, not specified as acute or chronic: Secondary | ICD-10-CM

## 2015-07-07 DIAGNOSIS — N135 Crossing vessel and stricture of ureter without hydronephrosis: Secondary | ICD-10-CM

## 2015-07-07 LAB — CBC
HEMATOCRIT: 37.4 % (ref 35.0–47.0)
HEMOGLOBIN: 12.7 g/dL (ref 12.0–16.0)
MCH: 28.2 pg (ref 26.0–34.0)
MCHC: 34 g/dL (ref 32.0–36.0)
MCV: 82.7 fL (ref 80.0–100.0)
Platelets: 228 10*3/uL (ref 150–440)
RBC: 4.52 MIL/uL (ref 3.80–5.20)
RDW: 12.2 % (ref 11.5–14.5)
WBC: 6.2 10*3/uL (ref 3.6–11.0)

## 2015-07-07 LAB — BASIC METABOLIC PANEL
ANION GAP: 8 (ref 5–15)
BUN: 11 mg/dL (ref 6–20)
CALCIUM: 9.1 mg/dL (ref 8.9–10.3)
CO2: 30 mmol/L (ref 22–32)
Chloride: 103 mmol/L (ref 101–111)
Creatinine, Ser: 0.55 mg/dL (ref 0.44–1.00)
GFR calc non Af Amer: >60 mL/min (ref >60)
Glucose, Bld: 130 mg/dL — ABNORMAL HIGH (ref 65–99)
POTASSIUM: 3.1 mmol/L — AB (ref 3.5–5.1)
Sodium: 141 mmol/L (ref 135–145)

## 2015-07-07 LAB — URINALYSIS COMPLETE WITH MICROSCOPIC (ARMC ONLY)
BILIRUBIN URINE: NEGATIVE
Glucose, UA: NEGATIVE mg/dL
Ketones, ur: NEGATIVE mg/dL
Nitrite: NEGATIVE
Protein, ur: 100 mg/dL — AB
Specific Gravity, Urine: 1.015 (ref 1.005–1.030)
pH: 5 (ref 5.0–8.0)

## 2015-07-07 LAB — POCT PREGNANCY, URINE: Preg Test, Ur: NEGATIVE

## 2015-07-07 MED ORDER — MORPHINE SULFATE (PF) 2 MG/ML IV SOLN
2.0000 mg | INTRAVENOUS | Status: DC | PRN
  Administered 2015-07-08 – 2015-07-09 (×4): 2 mg via INTRAVENOUS
  Filled 2015-07-07 (×4): qty 1

## 2015-07-07 MED ORDER — ONDANSETRON HCL 4 MG PO TABS
4.0000 mg | ORAL_TABLET | Freq: Four times a day (QID) | ORAL | Status: DC | PRN
Start: 2015-07-07 — End: 2015-07-09

## 2015-07-07 MED ORDER — ACETAMINOPHEN 325 MG PO TABS: 650.0000 mg | ORAL_TABLET | Freq: Four times a day (QID) | ORAL | Status: DC | PRN

## 2015-07-07 MED ORDER — KETOROLAC TROMETHAMINE 30 MG/ML IJ SOLN
INTRAMUSCULAR | Status: AC
  Administered 2015-07-07: 30 mg via INTRAVENOUS
  Filled 2015-07-07: qty 1

## 2015-07-07 MED ORDER — KETOROLAC TROMETHAMINE 30 MG/ML IJ SOLN
30.0000 mg | Freq: Once | INTRAMUSCULAR | Status: AC
  Administered 2015-07-07: 30 mg via INTRAVENOUS

## 2015-07-07 MED ORDER — SODIUM CHLORIDE 0.9 % IV SOLN
INTRAVENOUS | Status: DC
  Administered 2015-07-08: via INTRAVENOUS

## 2015-07-07 MED ORDER — SODIUM CHLORIDE 0.9 % IV BOLUS (SEPSIS)
1000.0000 mL | Freq: Once | INTRAVENOUS | Status: AC
  Administered 2015-07-07: 1000 mL via INTRAVENOUS

## 2015-07-07 MED ORDER — ONDANSETRON HCL 4 MG/2ML IJ SOLN
4.0000 mg | Freq: Four times a day (QID) | INTRAMUSCULAR | Status: DC | PRN
  Administered 2015-07-08: 4 mg via INTRAVENOUS
  Filled 2015-07-07: qty 2

## 2015-07-07 MED ORDER — KETOROLAC TROMETHAMINE 30 MG/ML IJ SOLN: 30.0000 mg | Freq: Three times a day (TID) | INTRAMUSCULAR | Status: AC | PRN

## 2015-07-07 MED ORDER — ACETAMINOPHEN 650 MG RE SUPP: 650.0000 mg | Freq: Four times a day (QID) | RECTAL | Status: DC | PRN

## 2015-07-07 MED ORDER — CEFTRIAXONE SODIUM 1 G IJ SOLR
1.0000 g | Freq: Once | INTRAMUSCULAR | Status: AC
  Administered 2015-07-07: 1 g via INTRAVENOUS
  Filled 2015-07-07 (×2): qty 10

## 2015-07-07 NOTE — ED Notes (Signed)
Hospitalist at bedside 

## 2015-07-07 NOTE — ED Notes (Signed)
Pt ambulated to treatment room with steady gait, pt presents to ED with c/o lower back pain and vaginal pain. Pt reports pain in urethra after voiding. Pt has hx of kidney stones. Pt reports urine has "a strong odor." Pt denies chest pain, dizziness, weakness, or shortness of breath. Pt denies fever at home. Pt alert and oriented x 4, no increased work in breathing noted, skin warm and dry.

## 2015-07-07 NOTE — ED Provider Notes (Addendum)
[facility] Emergency Department Provider Note  ____________________________________________   I have reviewed the triage vital signs and the nursing notes.   HISTORY  Chief Complaint Flank Pain and Dysuria    HPI Katelyn Raiford NobleM Koppelman is a 39 y.o. female with a history of recurrent urinary tract infections as well as kidney stones and lithotripsy in the past presents today complaining of left-sided flank pain and burning with urination. She states the symptoms and been going on for 2 weeks but they've been getting gradually worse. Denies vomiting. Denies fever. States that her urine is foul-smelling and that she has had progressive dysuria.  Past Medical History  Diagnosis Date  . Kidney stone   . Gravida 1 para 1   . Chronic constipation     Patient Active Problem List   Diagnosis Date Noted  . Chronic back pain 12/24/2014  . Family history of heart disease in female family member before age 41 12/24/2014  . Low libido 12/24/2014    Past Surgical History  Procedure Laterality Date  . Back surgery    . Abdominal hysterectomy    . Laparoscopic gastric sleeve resection    . Kidney stone surgery      Current Outpatient Rx  Name  Route  Sig  Dispense  Refill  . acyclovir (ZOVIRAX) 400 MG tablet   Oral   Take 1 tablet (400 mg total) by mouth 2 (two) times daily.   20 tablet   5   . cephALEXin (KEFLEX) 500 MG capsule   Oral   Take 1 capsule (500 mg total) by mouth 3 (three) times daily.   30 capsule   0   . cyclobenzaprine (FLEXERIL) 10 MG tablet   Oral   Take 1 tablet (10 mg total) by mouth at bedtime.   30 tablet   3   . Magnesium 400 MG TABS   Oral   Take 1 tablet by mouth at bedtime.         . ondansetron (ZOFRAN) 4 MG tablet   Oral   Take 1 tablet (4 mg total) by mouth daily as needed for nausea or vomiting.   20 tablet   0   . oxyCODONE-acetaminophen (ROXICET) 5-325 MG tablet   Oral   Take 1 tablet by mouth every 4 (four)  hours as needed for severe pain.   15 tablet   0   . predniSONE (DELTASONE) 20 MG tablet   Oral   Take 3 tablets (60 mg total) by mouth daily. Filled today and start tomorrow. You received you first dose of this medication in the ER today.   12 tablet   0   . tamsulosin (FLOMAX) 0.4 MG CAPS capsule   Oral   Take 1 capsule (0.4 mg total) by mouth daily.   14 capsule   0   . traMADol (ULTRAM) 50 MG tablet   Oral   Take 1 tablet (50 mg total) by mouth every 8 (eight) hours as needed for moderate pain.   15 tablet   0     Allergies Iodinated diagnostic agents  Family History  Problem Relation Age of Onset  . Heart disease Mother 7545  . Cancer Maternal Grandmother     Social History Social History  Substance Use Topics  . Smoking status: Never Smoker   . Smokeless tobacco: Never Used  . Alcohol Use: 0.0 oz/week    0 Standard drinks or equivalent per week     Comment: occ  Review of Systems Constitutional: No fever/chills Eyes: No visual changes. ENT: No sore throat. No stiff neck no neck pain Cardiovascular: Denies chest pain. Respiratory: Denies shortness of breath. Gastrointestinal:   no vomiting.  No diarrhea.  No constipation. Genitourinary: Positive for dysuria. Musculoskeletal: Negative lower extremity swelling Skin: Negative for rash. Neurological: Negative for headaches, focal weakness or numbness. 10-point ROS otherwise negative.  ____________________________________________   PHYSICAL EXAM:  VITAL SIGNS: ED Triage Vitals  Enc Vitals Group     BP 07/07/15 1626 122/79 mmHg     Pulse Rate 07/07/15 1626 97     Resp 07/07/15 1626 18     Temp 07/07/15 1626 98.2 F (36.8 C)     Temp Source 07/07/15 1626 Oral     SpO2 07/07/15 1626 100 %     Weight 07/07/15 1620 135 lb (61.236 kg)     Height 07/07/15 1620  (1.651 m)     Head Cir --      Peak Flow --      Pain Score 07/07/15 1620 5     Pain Loc --      Pain Edu? --      Excl. in GC? --      Constitutional: Alert and oriented. Well appearing and in no acute distress. Eyes: Conjunctivae are normal. PERRL. EOMI. Head: Atraumatic. Nose: No congestion/rhinnorhea. Mouth/Throat: Mucous membranes are moist.  Oropharynx non-erythematous. Neck: No stridor.   Nontender with no meningismus Cardiovascular: Normal rate, regular rhythm. Grossly normal heart sounds.  Good peripheral circulation. Respiratory: Normal respiratory effort.  No retractions. Lungs CTAB. Abdominal: No lower quadrant tenderness on the left. No distention. No guarding no rebound Back:  There is no focal tenderness or step off there is no midline tenderness there are no lesions noted. there is mild left CVA tenderness Musculoskeletal: No lower extremity tenderness. No joint effusions, no DVT signs strong distal pulses no edema Neurologic:  Normal speech and language. No gross focal neurologic deficits are appreciated.  Skin:  Skin is warm, dry and intact. No rash noted. Psychiatric: Mood and affect are normal. Speech and behavior are normal.  ____________________________________________   LABS (all labs ordered are listed, but only abnormal results are displayed)  Labs Reviewed  BASIC METABOLIC PANEL - Abnormal; Notable for the following:    Potassium 3.1 (*)    Glucose, Bld 130 (*)    All other components within normal limits  URINALYSIS COMPLETEWITH MICROSCOPIC ([facility] ONLY) - Abnormal; Notable for the following:    Color, Urine YELLOW (*)    APPearance CLOUDY (*)    Hgb urine dipstick 1+ (*)    Protein, ur 100 (*)    Leukocytes, UA 3+ (*)    Bacteria, UA MANY (*)    Squamous Epithelial / LPF 0-5 (*)    All other components within normal limits  URINE CULTURE  CBC  POCT PREGNANCY, URINE   ____________________________________________  EKG  I personally interpreted any EKGs ordered by me or triage  ____________________________________________  RADIOLOGY  I reviewed any imaging ordered by me or  triage that were performed during my shift ____________________________________________   PROCEDURES  Procedure(s) performed: None  Critical Care performed: None  ____________________________________________   INITIAL IMPRESSION / ASSESSMENT AND PLAN / ED COURSE  Pertinent labs & imaging results that were available during my care of the patient were reviewed by me and considered in my medical decision making (see chart for details).  Patient has what appears to be a urinary tract  infection with possible pyelonephritis, we will obtain a urine culture as an add-on, I'm giving her IV fluid she is otherwise well-appearing. We will give her Rocephin pending culture. Given her history of kidney stones and the possibility of an infected kidney stone, we'll obtain a CT scan albeit reluctantly as I do understand that she has had them before. However, a significant urinary tract infection in the context of a obstructing stone would change her disposition.  ----------------------------------------- 9:49 PM on 07/07/2015 -----------------------------------------  Discussed with the on-call urologist, Dr. 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 review of prior culture shows sensitivity to Rocephin. We will discuss with hospitalist service admission. Dr. Sherron Monday does not feel that acute percutaneous drainage or other intervention is required at this time which is certainly reasonable in my opinion as well. Appreciate the consult.  ____________________________________________   FINAL CLINICAL IMPRESSION(S) / ED DIAGNOSES  Final diagnoses:  Flank pain     Jeanmarie Plant, MD 07/07/15 2007  Jeanmarie Plant, MD 07/07/15 2151

## 2015-07-07 NOTE — H&P (Signed)
[facility] [place] [facility]   PATIENT NAME: Katelyn Holland    MR#:  [id number]  DATE OF BIRTH:  09-Aug-1975  DATE OF ADMISSION:  07/07/2015  PRIMARY CARE PHYSICIAN: Pcp Not In System   REQUESTING/REFERRING PHYSICIAN: Alphonzo LemmingsMcShane, M.D.  CHIEF COMPLAINT:   Chief Complaint  Patient presents with  . Flank Pain  . Dysuria    HISTORY OF PRESENT ILLNESS:  Katelyn QuailsCrystal Beverlin  is a 39 y.o. female who presents with flank pain and malodorous urine. Patient states that she has a chronic history of recurring kidney stones. She began having left flank pain couple weeks ago, but that it is likely due to kidney stone. She began increasing her fluid intake to try and manage it. Several days ago she noted that her urine started to have strong smell. This did not improve, and she decided to come in the ED for evaluation. CT here shows gas in the left kidney likely related to gas producing bacteria and pyelonephritis. She also has some nonobstructing kidney stones in the kidney. She also states that she has had small amount of hematuria, which she states has slowly been increasing. She states that initially she just noticed small amounts of blood with her urine when she wiped, but today she noticed some red tinge to her urine. Hospitalists were called for admission  PAST MEDICAL HISTORY:   Past Medical History  Diagnosis Date  . Kidney stone   . Gravida 1 para 1   . Chronic constipation     PAST SURGICAL HISTORY:   Past Surgical History  Procedure Laterality Date  . Back surgery    . Abdominal hysterectomy    . Laparoscopic gastric sleeve resection    . Kidney stone surgery      SOCIAL HISTORY:   Social History  Substance Use Topics  . Smoking status: Never Smoker   . Smokeless tobacco: Never Used  . Alcohol Use: 0.0 oz/week    0 Standard drinks or equivalent per week     Comment: occ    FAMILY HISTORY:   Family History  Problem Relation Age of Onset  .  Heart disease Mother 7545  . Cancer Maternal Grandmother     DRUG ALLERGIES:   Allergies  Allergen Reactions  . Iodinated Diagnostic Agents Nausea Only    MEDICATIONS AT HOME:   Prior to Admission medications   Medication Sig Start Date End Date Taking? Authorizing Provider  acyclovir (ZOVIRAX) 400 MG tablet Take 1 tablet (400 mg total) by mouth 2 (two) times daily. 05/21/15  Yes Amy Rusty AusLauren Krebs, NP  cyclobenzaprine (FLEXERIL) 10 MG tablet Take 1 tablet (10 mg total) by mouth at bedtime. 05/21/15  Yes Amy Rusty AusLauren Krebs, NP  Magnesium 400 MG TABS Take 1 tablet by mouth at bedtime. 12/24/14   Amy Rusty AusLauren Krebs, NP  ondansetron (ZOFRAN) 4 MG tablet Take 1 tablet (4 mg total) by mouth daily as needed for nausea or vomiting. 04/09/15   Phineas SemenGraydon Goodman, MD  oxyCODONE-acetaminophen (ROXICET) 5-325 MG tablet Take 1 tablet by mouth every 4 (four) hours as needed for severe pain. 04/09/15   Phineas SemenGraydon Goodman, MD  tamsulosin (FLOMAX) 0.4 MG CAPS capsule Take 1 capsule (0.4 mg total) by mouth daily. 04/09/15   Phineas SemenGraydon Goodman, MD  traMADol (ULTRAM) 50 MG tablet Take 1 tablet (50 mg total) by mouth every 8 (eight) hours as needed for moderate pain. 01/29/15 01/29/16  Gayla DossEryka A Gayle, MD    REVIEW OF SYSTEMS:  Review of Systems  Constitutional: Positive for malaise/fatigue. Negative for fever, chills and weight loss.  HENT: Negative for ear pain, hearing loss and tinnitus.   Eyes: Negative for blurred vision, double vision, pain and redness.  Respiratory: Negative for cough, hemoptysis and shortness of breath.   Cardiovascular: Negative for chest pain, palpitations, orthopnea and leg swelling.  Gastrointestinal: Positive for nausea. Negative for vomiting, abdominal pain, diarrhea and constipation.  Genitourinary: Positive for hematuria and flank pain. Negative for dysuria and frequency.  Musculoskeletal: Positive for back pain. Negative for joint pain and neck pain.  Skin:       No acne, rash, or lesions   Neurological: Negative for dizziness, tremors, focal weakness and weakness.  Endo/Heme/Allergies: Negative for polydipsia. Does not bruise/bleed easily.  Psychiatric/Behavioral: Negative for depression. The patient is not nervous/anxious and does not have insomnia.      VITAL SIGNS:   Filed Vitals:   07/07/15 1620 07/07/15 1626 07/07/15 2001 07/07/15 2240  BP:  122/79 122/75 119/76  Pulse:  97 80 80  Temp:  98.2 F (36.8 C)    TempSrc:  Oral    Resp:  Height:  (1.651 m)     Weight: 61.236 kg (135 lb)     SpO2:  100% 100% 100%   Wt Readings from Last 3 Encounters:  07/07/15 61.236 kg (135 lb)  06/08/15 61.236 kg (135 lb)  04/09/15 58.968 kg (130 lb)    PHYSICAL EXAMINATION:  Physical Exam  Vitals reviewed. Constitutional: She is oriented to person, place, and time. She appears well-developed and well-nourished. No distress.  HENT:  Head: Normocephalic and atraumatic.  Mouth/Throat: Oropharynx is clear and moist.  Eyes: Conjunctivae and EOM are normal. Pupils are equal, round, and reactive to light. No scleral icterus.  Neck: Normal range of motion. Neck supple. No JVD present. No thyromegaly present.  Cardiovascular: Normal rate, regular rhythm and intact distal pulses.  Exam reveals no gallop and no friction rub.   No murmur heard. Respiratory: Effort normal and breath sounds normal. No respiratory distress. She has no wheezes. She has no rales.  GI: Soft. Bowel sounds are normal. She exhibits no distension. There is no tenderness.  Musculoskeletal: Normal range of motion. She exhibits tenderness (left flank). She exhibits no edema.  No arthritis, no gout  Lymphadenopathy:    She has no cervical adenopathy.  Neurological: She is alert and oriented to person, place, and time. No cranial nerve deficit.  No dysarthria, no aphasia  Skin: Skin is warm and dry. No rash noted. No erythema.  Psychiatric: She has a normal mood and affect. Her behavior is normal.  Judgment and thought content normal.    LABORATORY PANEL:   CBC  Recent Labs Lab 07/07/15 1656  WBC 6.2  HGB 12.7  HCT 37.4  PLT 228   ------------------------------------------------------------------------------------------------------------------  Chemistries   Recent Labs Lab 07/07/15 1656  NA 141  K 3.1*  CL 103  CO2 30  GLUCOSE 130*  BUN 11  CREATININE 0.55  CALCIUM 9.1   ------------------------------------------------------------------------------------------------------------------  Cardiac Enzymes No results for input(s): TROPONINI in the last 168 hours. ------------------------------------------------------------------------------------------------------------------  RADIOLOGY:  Ct Renal Stone Study  07/07/2015  CLINICAL DATA:  Two week history of progressively worsening left flank pain, dysuria and hematuria. Personal history of urinary tract infections and urinary tract calculi requiring lithotripsy. Surgical history also includes a gastric sleeve procedure for obesity. EXAM: CT ABDOMEN AND PELVIS WITHOUT CONTRAST TECHNIQUE: Multidetector CT imaging of  the abdomen and pelvis was performed following the standard protocol without IV contrast. COMPARISON:  Ten prior CT abdomen and pelvis examinations dating back to November, 2005, most recently 07/09/2014. FINDINGS: Lower chest:  Heart size normal.  Visualized lung bases clear. Hepatobiliary: Normal unenhanced appearance of the liver. Gallbladder normal in appearance without calcified gallstones. No biliary ductal dilation. Pancreas: Normal unenhanced appearance. Spleen: Normal unenhanced appearance. Adrenals/Urinary Tract: Normal appearing adrenal glands. Chronic left hydronephrosis. No evidence of left ureteral dilation. No obstructing left ureteral calculus. Numerous small stone fragments in lower pole calices of the left kidney, the largest approximating 5 mm. Asymmetric enlargement of the left kidney relative to  the right, unchanged. Gas within an independent upper pole calix of the left kidney, a new finding. Normal unenhanced appearance of the right kidney. No right urinary tract calculi. Gas within the otherwise normal-appearing urinary bladder. Stomach/Bowel: Postsurgical changes related to the prior gastric sleeve procedure. No complicating features. Normal-appearing small bowel. Large colonic stool burden. Tortuous, redundant sigmoid colon. No evidence of diverticulosis or other focal colonic abnormality. Appendix not clearly visualized, but no pericecal inflammation. Vascular/Lymphatic: No visible aortoiliofemoral atherosclerosis. No pathologic lymphadenopathy. Reproductive: Uterus surgically absent.  No adnexal masses. Other: None. Musculoskeletal: Degenerative disc disease at L4-5 and L5-S1 with likely chronic disc protrusions as there is calcification in the posterior annular fibers at both levels. Facet degenerative changes at these levels. IMPRESSION: 1. Chronic left hydronephrosis dating back to at least 2005 with numerous nonobstructing stone fragments in lower pole calices, the largest fragment approximating 5 mm. 2. Gas within an upper pole calix of the left kidney and gas within the urinary bladder. In the absence of recent instrumentation, infection with a gas forming organism is suspected. 3. No evidence of right urinary tract calculi. 4. Postsurgical changes related to the prior gastric sleeve procedure, without complication. 5. Large colonic stool burden. Electronically Signed   By: Hulan Saas M.D.   On: 07/07/2015 20:26    EKG:   Orders placed or performed in visit on 07/19/12  . EKG 12-Lead    IMPRESSION AND PLAN:  Principal Problem:   Pyelonephritis - gas seen on CT scan concerning for gas producing bacteria.  Prior urine cultures have not shown resistant bacteria for this patient.  patient was given IV Rocephin, we will continue this antibiotic on admission. We'll get a urology  consult. Active Problems:   Chronic constipation - large stool burden seen on CT scan, will order miralax prn   Kidney stones - non obstructing stones seen on CT along with chronic hydronephrosis.  Urology consult as above, defer to their recommendations for this.  All the records are reviewed and case discussed with ED provider. Management plans discussed with the patient and/or family.  DVT PROPHYLAXIS: mechanical only  GI PROPHYLAXIS: None  ADMISSION STATUS: Inpatient  CODE STATUS: Full  TOTAL TIME TAKING CARE OF THIS PATIENT: 40 minutes.    Severus Brodzinski FIELDING 07/07/2015, 11:01 PM  [facility]  Office  [phone]  CC: Primary care physician; Pcp Not In System

## 2015-07-07 NOTE — ED Notes (Signed)
Pt ambulated to bathroom and back to treatment room with steady gait.

## 2015-07-07 NOTE — ED Notes (Signed)
Flank pain right side, burning with urination, bleeding with urination, worsening over the past 2 weeks.

## 2015-07-08 ENCOUNTER — Inpatient Hospital Stay: Payer: Self-pay

## 2015-07-08 DIAGNOSIS — N130 Hydronephrosis with ureteropelvic junction obstruction: Secondary | ICD-10-CM

## 2015-07-08 DIAGNOSIS — N2884 Pyelitis cystica: Secondary | ICD-10-CM

## 2015-07-08 DIAGNOSIS — N200 Calculus of kidney: Secondary | ICD-10-CM

## 2015-07-08 LAB — PROTIME-INR
INR: 1.1
PROTHROMBIN TIME: 14.4 s (ref 11.4–15.0)

## 2015-07-08 LAB — BASIC METABOLIC PANEL
ANION GAP: 4 — AB (ref 5–15)
BUN: 13 mg/dL (ref 6–20)
CO2: 29 mmol/L (ref 22–32)
Calcium: 8.3 mg/dL — ABNORMAL LOW (ref 8.9–10.3)
Chloride: 110 mmol/L (ref 101–111)
Creatinine, Ser: 0.46 mg/dL (ref 0.44–1.00)
GLUCOSE: 97 mg/dL (ref 65–99)
POTASSIUM: 3.3 mmol/L — AB (ref 3.5–5.1)
Sodium: 143 mmol/L (ref 135–145)

## 2015-07-08 LAB — CBC
HEMATOCRIT: 33.7 % — AB (ref 35.0–47.0)
HEMOGLOBIN: 11.5 g/dL — AB (ref 12.0–16.0)
MCH: 28.2 pg (ref 26.0–34.0)
MCHC: 34.1 g/dL (ref 32.0–36.0)
MCV: 82.8 fL (ref 80.0–100.0)
Platelets: 198 10*3/uL (ref 150–440)
RBC: 4.08 MIL/uL (ref 3.80–5.20)
RDW: 12.1 % (ref 11.5–14.5)
WBC: 5.8 10*3/uL (ref 3.6–11.0)

## 2015-07-08 LAB — MAGNESIUM: Magnesium: 1.9 mg/dL (ref 1.7–2.4)

## 2015-07-08 MED ORDER — FENTANYL CITRATE (PF) 100 MCG/2ML IJ SOLN
INTRAMUSCULAR | Status: AC | PRN
Start: 2015-07-08 — End: 2015-07-08
  Administered 2015-07-08 (×2): 12.5 ug via INTRAVENOUS
  Administered 2015-07-08: 25 ug via INTRAVENOUS
  Administered 2015-07-08: 15:00:00 50 ug via INTRAVENOUS

## 2015-07-08 MED ORDER — DEXTROSE 5 % IV SOLN: 2.0000 g | INTRAVENOUS | Status: DC

## 2015-07-08 MED ORDER — IOHEXOL 300 MG/ML  SOLN
30.0000 mL | Freq: Once | INTRAMUSCULAR | Status: AC | PRN
  Administered 2015-07-08: 15:00:00 2 mL

## 2015-07-08 MED ORDER — MIDAZOLAM HCL 5 MG/5ML IJ SOLN
INTRAMUSCULAR | Status: AC
  Filled 2015-07-08: qty 10

## 2015-07-08 MED ORDER — POLYETHYLENE GLYCOL 3350 17 G PO PACK: 17.0000 g | PACK | Freq: Two times a day (BID) | ORAL | Status: DC | PRN

## 2015-07-08 MED ORDER — PIPERACILLIN-TAZOBACTAM 3.375 G IVPB
3.3750 g | Freq: Three times a day (TID) | INTRAVENOUS | Status: DC
  Administered 2015-07-08 – 2015-07-09 (×4): 3.375 g via INTRAVENOUS
  Filled 2015-07-08 (×7): qty 50

## 2015-07-08 MED ORDER — LIDOCAINE HCL (PF) 1 % IJ SOLN
INTRAMUSCULAR | Status: DC | PRN
  Administered 2015-07-08: 15:00:00 10 mL

## 2015-07-08 MED ORDER — MIDAZOLAM HCL 5 MG/5ML IJ SOLN
INTRAMUSCULAR | Status: AC | PRN
  Administered 2015-07-08 (×3): 0.5 mg via INTRAVENOUS
  Administered 2015-07-08: 2 mg via INTRAVENOUS

## 2015-07-08 MED ORDER — POTASSIUM CHLORIDE CRYS ER 20 MEQ PO TBCR
40.0000 meq | EXTENDED_RELEASE_TABLET | Freq: Once | ORAL | Status: AC
  Administered 2015-07-08: 40 meq via ORAL
  Filled 2015-07-08: qty 2

## 2015-07-08 MED ORDER — OXYBUTYNIN CHLORIDE 5 MG PO TABS
5.0000 mg | ORAL_TABLET | Freq: Three times a day (TID) | ORAL | Status: DC
  Administered 2015-07-08 – 2015-07-09 (×4): 5 mg via ORAL
  Filled 2015-07-08 (×4): qty 1

## 2015-07-08 MED ORDER — POTASSIUM CHLORIDE IN NACL 20-0.9 MEQ/L-% IV SOLN
INTRAVENOUS | Status: DC
Start: 2015-07-08 — End: 2015-07-09
  Administered 2015-07-08 – 2015-07-09 (×4): via INTRAVENOUS
  Filled 2015-07-08 (×7): qty 1000

## 2015-07-08 MED ORDER — FENTANYL CITRATE (PF) 100 MCG/2ML IJ SOLN
INTRAMUSCULAR | Status: AC
  Filled 2015-07-08: qty 4

## 2015-07-08 MED ORDER — TRAMADOL HCL 50 MG PO TABS
50.0000 mg | ORAL_TABLET | Freq: Four times a day (QID) | ORAL | Status: DC | PRN
  Administered 2015-07-09: 11:00:00 50 mg via ORAL
  Filled 2015-07-08: qty 1

## 2015-07-08 NOTE — Progress Notes (Signed)
Regular diet per Dr Winona LegatoVaickute

## 2015-07-08 NOTE — Plan of Care (Signed)
Problem: Education: Goal: Knowledge of [facility] Education information/materials will improve Outcome: Progressing Pt given [facility] admission packet. Also, given other educational materials related to medical dx. Pt allowed time for questions.   Problem: Safety: Goal: Ability to remain free from injury will improve Outcome: Progressing Pt moderate fall risk. Up independently without difficulty. Spouse at bedside.     Problem: Pain Managment: Goal: General experience of comfort will improve Outcome: Progressing Pt received Morphine for pain 7/10 with relief. Pt said that her pain worsens with urination.   Problem: Physical Regulation: Goal: Will remain free from infection Outcome: Progressing Pt encouraged to perform hand hygiene. Pt given information on UTI's.   Problem: Skin Integrity: Goal: Risk for impaired skin integrity will decrease Outcome: Progressing Skin intact   Problem: Fluid Volume: Goal: Ability to maintain a balanced intake and output will improve Outcome: Progressing Currently NPO. IVF at 75/hr.   Problem: Education: Goal: Knowledge of treatment and prevention of UTI/Pyleonephritis will improve Outcome: Progressing Written materials given.   Problem: Urinary Elimination: Goal: Signs and symptoms of infection will decrease Outcome: Progressing Pt afebrile. Rocephin ordered q 24 hrs

## 2015-07-08 NOTE — Progress Notes (Signed)
ANTIBIOTIC CONSULT NOTE - INITIAL  Pharmacy Consult for Zosyn Indication: Acute gangrenous pyelonephritis   Allergies  Allergen Reactions  . Iodinated Diagnostic Agents Nausea Only    Patient Measurements: Height: 5\' 5"  (165.1 cm) Weight: 156 lb 6.4 oz (70.943 kg) IBW/kg (Calculated) : 57 Adjusted Body Weight:   Vital Signs: Temp: 98.1 F (36.7 C) (12/28 0507) Temp Source: Oral (12/28 0507) BP: 103/62 mmHg (12/28 0507) Pulse Rate: 90 (12/28 0507) Intake/Output from previous day: 12/27 0701 - 12/28 0700 In: -  Out: 350 [Urine:350] Intake/Output from this shift:    Labs:  Recent Labs  07/07/15 1656 07/08/15 0516  WBC 6.2 5.8  HGB 12.7 11.5*  PLT 228 198  CREATININE 0.55 0.46   Estimated Creatinine Clearance: 91.5 mL/min (by C-G formula based on Cr of 0.46). No results for input(s): VANCOTROUGH, VANCOPEAK, VANCORANDOM, GENTTROUGH, GENTPEAK, GENTRANDOM, TOBRATROUGH, TOBRAPEAK, TOBRARND, AMIKACINPEAK, AMIKACINTROU, AMIKACIN in the last 72 hours.   Microbiology: No results found for this or any previous visit (from the past 720 hour(s)).  Medical History: Past Medical History  Diagnosis Date  . Kidney stone   . Gravida 1 para 1   . Chronic constipation     Medications:  Prescriptions prior to admission  Medication Sig Dispense Refill Last Dose  . acyclovir (ZOVIRAX) 400 MG tablet Take 1 tablet (400 mg total) by mouth 2 (two) times daily. 20 tablet 5 PRN at PRN  . cyclobenzaprine (FLEXERIL) 10 MG tablet Take 1 tablet (10 mg total) by mouth at bedtime. 30 tablet 3 07/06/2015 at Unknown time  . Magnesium 400 MG TABS Take 1 tablet by mouth at bedtime.     . ondansetron (ZOFRAN) 4 MG tablet Take 1 tablet (4 mg total) by mouth daily as needed for nausea or vomiting. 20 tablet 0   . oxyCODONE-acetaminophen (ROXICET) 5-325 MG tablet Take 1 tablet by mouth every 4 (four) hours as needed for severe pain. 15 tablet 0   . tamsulosin (FLOMAX) 0.4 MG CAPS capsule Take 1  capsule (0.4 mg total) by mouth daily. 14 capsule 0   . traMADol (ULTRAM) 50 MG tablet Take 1 tablet (50 mg total) by mouth every 8 (eight) hours as needed for moderate pain. 15 tablet 0    Scheduled:  . oxybutynin  5 mg Oral TID  . piperacillin-tazobactam (ZOSYN)  IV  3.375 g Intravenous 3 times per day  . potassium chloride  40 mEq Oral Once   Assessment: Pharmacy consulted to dose zosyn in this 39 year old female being treated for pyelonephritis   Goal of Therapy:  Resolution of possible infection  Plan:  Follow up culture results  Will start Zosyn 3.375 g IV q8 hours.   Alayja Armas D 07/08/2015,8:22 AM

## 2015-07-08 NOTE — Procedures (Signed)
Successful US guided left percutaneous nephrostomy tube placement.   Placement of 10 french tube.  Removed 30 ml of blood tinged urine.  Minimal blood loss and no immediate complication.

## 2015-07-08 NOTE — Progress Notes (Signed)
ANTIBIOTIC CONSULT NOTE - INITIAL  Pharmacy Consult for ceftriaxone dosing Indication: UTI  Allergies  Allergen Reactions  . Iodinated Diagnostic Agents Nausea Only    Patient Measurements: Height: 5\' 5"  (165.1 cm) Weight: 156 lb 6.4 oz (70.943 kg) IBW/kg (Calculated) : 57 Adjusted Body Weight: n/a  Vital Signs: Temp: 98 F (36.7 C) (12/27 2351) Temp Source: Oral (12/27 2351) BP: 122/79 mmHg (12/27 2351) Pulse Rate: 93 (12/27 2351) Intake/Output from previous day:   Intake/Output from this shift:    Labs:  Recent Labs  07/07/15 1656  WBC 6.2  HGB 12.7  PLT 228  CREATININE 0.55   Estimated Creatinine Clearance: 91.5 mL/min (by C-G formula based on Cr of 0.55). No results for input(s): VANCOTROUGH, VANCOPEAK, VANCORANDOM, GENTTROUGH, GENTPEAK, GENTRANDOM, TOBRATROUGH, TOBRAPEAK, TOBRARND, AMIKACINPEAK, AMIKACINTROU, AMIKACIN in the last 72 hours.   Microbiology: No results found for this or any previous visit (from the past 720 hour(s)).  Medical History: Past Medical History  Diagnosis Date  . Kidney stone   . Gravida 1 para 1   . Chronic constipation     Medications:   Assessment: UA: LE(+) NO2(-) WBC TNTC Urine cx pending  Goal of Therapy:  Resolution of infection  Plan:  Ceftriaxone 2 grams IV q 24 hours ordered.  Miesha Bachmann S 07/08/2015,12:37 AM

## 2015-07-08 NOTE — Plan of Care (Addendum)
Problem: Urinary Elimination: Goal: Signs and symptoms of infection will decrease Outcome: Progressing Patient is alert and oriented. Pain medication given with noted relief. VSS. IV fluids infusing. Patient receiving IV antibiotics. Left nephrosomy tube placed without complications, draining well.

## 2015-07-08 NOTE — Care Management (Signed)
Admitted to this facility with the diagnosis of pyelonephritis. Lives with husband, Katelyn Holland [phone]). Last seen by a physician at [facility] about one year ago. Takes care of all basic and instrumental activities of daily living herself.  Drives, works at [employer] in [place].  No insurance currently. Will have BCBS on Sunday. States she had Sleeve surgery last October and has lost 135 pounds. No home health in the past. Friends/family will transport. Gwenette GreetBrenda S Annaleia Pence RN MSN CCM Care Management [phone]

## 2015-07-08 NOTE — H&P (Signed)
Chief Complaint: Patient was seen in consultation today for left nephrostomy tube placement. Chief Complaint  Patient presents with  . Flank Pain  . Dysuria    Referring Physician(s): McKenzie  History of Present Illness: Katelyn Holland is a 39 y.o. female with history of urinary tract infections and kidney stones.  Had kidney stones removed 15-20 years ago.  Presents with left flank pain and malodorous urine and hematuria.  Urine culture has gram negative rods.  CT demonstrated left hydronephrosis with non obstructive left kidney stones and gas in the renal collecting system.    Past Medical History  Diagnosis Date  . Kidney stone   . Gravida 1 para 1   . Chronic constipation     Past Surgical History  Procedure Laterality Date  . Back surgery    . Abdominal hysterectomy    . Laparoscopic gastric sleeve resection    . Kidney stone surgery      Allergies: Iodinated diagnostic agents  Medications: Prior to Admission medications   Medication Sig Start Date End Date Taking? Authorizing Provider  acyclovir (ZOVIRAX) 400 MG tablet Take 1 tablet (400 mg total) by mouth 2 (two) times daily. 05/21/15  Yes Amy Rusty AusLauren Krebs, NP  cyclobenzaprine (FLEXERIL) 10 MG tablet Take 1 tablet (10 mg total) by mouth at bedtime. 05/21/15  Yes Amy Rusty AusLauren Krebs, NP  Magnesium 400 MG TABS Take 1 tablet by mouth at bedtime. 12/24/14   Amy Rusty AusLauren Krebs, NP  ondansetron (ZOFRAN) 4 MG tablet Take 1 tablet (4 mg total) by mouth daily as needed for nausea or vomiting. 04/09/15   Phineas SemenGraydon Goodman, MD  oxyCODONE-acetaminophen (ROXICET) 5-325 MG tablet Take 1 tablet by mouth every 4 (four) hours as needed for severe pain. 04/09/15   Phineas SemenGraydon Goodman, MD  tamsulosin (FLOMAX) 0.4 MG CAPS capsule Take 1 capsule (0.4 mg total) by mouth daily. 04/09/15   Phineas SemenGraydon Goodman, MD  traMADol (ULTRAM) 50 MG tablet Take 1 tablet (50 mg total) by mouth every 8 (eight) hours as needed for moderate pain. 01/29/15 01/29/16   Gayla DossEryka A Gayle, MD     Family History  Problem Relation Age of Onset  . Heart disease Mother 2245  . Cancer Maternal Grandmother     Social History   Social History  . Marital Status: Married    Spouse Name: N/A  . Number of Children: N/A  . Years of Education: N/A   Social History Main Topics  . Smoking status: Never Smoker   . Smokeless tobacco: Never Used  . Alcohol Use: 0.0 oz/week    0 Standard drinks or equivalent per week     Comment: occ  . Drug Use: No  . Sexual Activity: Not Asked   Other Topics Concern  . None   Social History Narrative    Review of Systems  Genitourinary: Positive for flank pain.    Vital Signs: BP 126/68 mmHg  Pulse 92  Temp(Src) 98.4 F (36.9 C) (Oral)  Resp 24  Ht 5\' 5"  (1.651 m)  Wt 156 lb 6.4 oz (70.943 kg)  BMI 26.03 kg/m2  SpO2 100%  Physical Exam  Cardiovascular: Normal rate, regular rhythm and normal heart sounds.   Pulmonary/Chest: Effort normal and breath sounds normal.  Abdominal: Soft. Bowel sounds are normal.  Left flank pain.    Mallampati Score:  MD Evaluation Airway: WNL Heart: WNL Abdomen: Other (comments) Abdomen comments: left flank pain Chest/ Lungs: WNL ASA  Classification: 1 Mallampati/Airway Score: One  Imaging:  Ct Renal Stone Study  07/07/2015  CLINICAL DATA:  Two week history of progressively worsening left flank pain, dysuria and hematuria. Personal history of urinary tract infections and urinary tract calculi requiring lithotripsy. Surgical history also includes a gastric sleeve procedure for obesity. EXAM: CT ABDOMEN AND PELVIS WITHOUT CONTRAST TECHNIQUE: Multidetector CT imaging of the abdomen and pelvis was performed following the standard protocol without IV contrast. COMPARISON:  Ten prior CT abdomen and pelvis examinations dating back to November, 2005, most recently 07/09/2014. FINDINGS: Lower chest:  Heart size normal.  Visualized lung bases clear. Hepatobiliary: Normal unenhanced  appearance of the liver. Gallbladder normal in appearance without calcified gallstones. No biliary ductal dilation. Pancreas: Normal unenhanced appearance. Spleen: Normal unenhanced appearance. Adrenals/Urinary Tract: Normal appearing adrenal glands. Chronic left hydronephrosis. No evidence of left ureteral dilation. No obstructing left ureteral calculus. Numerous small stone fragments in lower pole calices of the left kidney, the largest approximating 5 mm. Asymmetric enlargement of the left kidney relative to the right, unchanged. Gas within an independent upper pole calix of the left kidney, a new finding. Normal unenhanced appearance of the right kidney. No right urinary tract calculi. Gas within the otherwise normal-appearing urinary bladder. Stomach/Bowel: Postsurgical changes related to the prior gastric sleeve procedure. No complicating features. Normal-appearing small bowel. Large colonic stool burden. Tortuous, redundant sigmoid colon. No evidence of diverticulosis or other focal colonic abnormality. Appendix not clearly visualized, but no pericecal inflammation. Vascular/Lymphatic: No visible aortoiliofemoral atherosclerosis. No pathologic lymphadenopathy. Reproductive: Uterus surgically absent.  No adnexal masses. Other: None. Musculoskeletal: Degenerative disc disease at L4-5 and L5-S1 with likely chronic disc protrusions as there is calcification in the posterior annular fibers at both levels. Facet degenerative changes at these levels. IMPRESSION: 1. Chronic left hydronephrosis dating back to at least 2005 with numerous nonobstructing stone fragments in lower pole calices, the largest fragment approximating 5 mm. 2. Gas within an upper pole calix of the left kidney and gas within the urinary bladder. In the absence of recent instrumentation, infection with a gas forming organism is suspected. 3. No evidence of right urinary tract calculi. 4. Postsurgical changes related to the prior gastric sleeve  procedure, without complication. 5. Large colonic stool burden. Electronically Signed   By: Hulan Saas M.D.   On: 07/07/2015 20:26    Labs:  CBC:  Recent Labs  07/09/14 1457 04/09/15 1650 07/07/15 1656 07/08/15 0516  WBC 9.2 6.4 6.2 5.8  HGB 12.6 12.0 12.7 11.5*  HCT 38.6 36.2 37.4 33.7*  PLT 208 184 228 198    COAGS:  Recent Labs  07/08/15 0852  INR 1.10    BMP:  Recent Labs  07/09/14 1457 04/09/15 1650 07/07/15 1656 07/08/15 0516  NA 143 136 141 143  K 3.5 3.4* 3.1* 3.3*  CL 108* 103 103 110  CO2 GLUCOSE 94 88 130* 97  BUN CALCIUM 9.0 9.0 9.1 8.3*  CREATININE 0.58* 0.53 0.55 0.46  GFRNONAA >60 >60 >60 >60  GFRAA >60 >60 >60 >60    LIVER FUNCTION TESTS:  Recent Labs  07/09/14 1457 04/09/15 1650  BILITOT 0.5 0.8  AST 27 18  ALT 16 13*  ALKPHOS 97 66  PROT 7.5 7.2  ALBUMIN 3.8 4.5    TUMOR MARKERS: No results for input(s): AFPTM, CEA, CA199, CHROMGRNA in the last 8760 hours.  Assessment and Plan:  39 yo with left hydronephrosis, urinary tract infection and kidney stones.  Gas in the  collecting system is concerning for a severe infection.  Review of old CT exams suggests a left UPJ stricture/obstruction.  Discussed with Dr. Ronne Binning of Urology.  Plan for percutaneous nephrostomy to treat the infection and adequately drain the kidney.  Discussed percutaneous nephrostomy with patient in depth.  Patient has a remote history of nausea with IV contrast, so will try to limit iodinated contrast use if possible.  Informed consent obtained from patient.  Plan for left nephrostomy tube placement with moderate sedation.      SignedAbundio Miu 07/08/2015, 2:12 PM

## 2015-07-08 NOTE — Progress Notes (Addendum)
[facility] - [place] at [facility]   PATIENT NAME: Katelyn Holland    MR#:  [id number]  DATE OF BIRTH:  Apr 17, 1976  SUBJECTIVE:  CHIEF COMPLAINT:   Chief Complaint  Patient presents with  . Flank Pain  . Dysuria   patient is 39 year old Caucasian female who presents with left abdominal left flank pain, dysuria and malodorous urine. In emergency room, she was noted to have nonobstructive stones as well as hydronephrosis on the left and had left nephrostomy tube placed by radiologist. After discussion with urologist, Dr. Ronne Binning. Before nephrostomy tube. Patient had left flank pain as well as some abdominal pain on the left. But felt overall better since yesterday.   Review of Systems  Constitutional: Positive for malaise/fatigue. Negative for fever, chills and weight loss.  HENT: Negative for congestion.   Eyes: Negative for blurred vision and double vision.  Respiratory: Negative for cough, sputum production, shortness of breath and wheezing.   Cardiovascular: Negative for chest pain, palpitations, orthopnea, leg swelling and PND.  Gastrointestinal: Positive for abdominal pain. Negative for nausea, vomiting, diarrhea, constipation and blood in stool.  Genitourinary: Positive for hematuria and flank pain. Negative for dysuria, urgency and frequency.  Musculoskeletal: Negative for falls.  Neurological: Negative for dizziness, tremors, focal weakness and headaches.  Endo/Heme/Allergies: Does not bruise/bleed easily.  Psychiatric/Behavioral: Negative for depression. The patient does not have insomnia.     VITAL SIGNS: Blood pressure 113/80, pulse 103, temperature 98.4 F (36.9 C), temperature source Oral, resp. rate 11, height  (1.651 m), weight 70.943 kg (156 lb 6.4 oz), SpO2 100 %.  PHYSICAL EXAMINATION:   GENERAL:  39 y.o.-year-old patient lying in the bed with no acute distress.  EYES: Pupils equal, round, reactive to light and accommodation. No  scleral icterus. Extraocular muscles intact.  HEENT: Head atraumatic, normocephalic. Oropharynx and nasopharynx clear.  NECK:  Supple, no jugular venous distention. No thyroid enlargement, no tenderness.  LUNGS: Normal breath sounds bilaterally, no wheezing, rales,rhonchi or crepitation. No use of accessory muscles of respiration.  CARDIOVASCULAR: S1, S2 normal. No murmurs, rubs, or gallops.  ABDOMEN: Soft, tender in left side mid abdomen but no rebound or guarding was noted, nondistended. Bowel sounds present. No organomegaly or mass. Left CVS tenderness on percussion EXTREMITIES: No pedal edema, cyanosis, or clubbing.  NEUROLOGIC: Cranial nerves II through XII are intact. Muscle strength 5/5 in all extremities. Sensation intact. Gait not checked.  PSYCHIATRIC: The patient is alert and oriented x 3.  SKIN: No obvious rash, lesion, or ulcer.   ORDERS/RESULTS REVIEWED:   CBC  Recent Labs Lab 07/07/15 1656 07/08/15 0516  WBC 6.2 5.8  HGB 12.7 11.5*  HCT 37.4 33.7*  PLT 228 198  MCV 82.7 82.8  MCH 28.2 28.2  MCHC 34.0 34.1  RDW 12.2 12.1   ------------------------------------------------------------------------------------------------------------------  Chemistries   Recent Labs Lab 07/07/15 1656 07/08/15 0516  NA 141 143  K 3.1* 3.3*  CL 103 110  CO2 30 29  GLUCOSE 130* 97  BUN 11 13  CREATININE 0.55 0.46  CALCIUM 9.1 8.3*  MG  --  1.9   ------------------------------------------------------------------------------------------------------------------ estimated creatinine clearance is 91.5 mL/min (by C-G formula based on Cr of 0.46). ------------------------------------------------------------------------------------------------------------------ No results for input(s): TSH, T4TOTAL, T3FREE, THYROIDAB in the last 72 hours.  Invalid input(s): FREET3  Cardiac Enzymes No results for input(s): CKMB, TROPONINI, MYOGLOBIN in the last 168 hours.  Invalid input(s):  CK ------------------------------------------------------------------------------------------------------------------ Invalid input(s): POCBNP ---------------------------------------------------------------------------------------------------------------  RADIOLOGY: [place] Intraoperative  07/08/2015  CLINICAL DATA:  Ultrasound was provided for use by the ordering physician, and a technical charge was applied by the performing facility.  No radiologist interpretation/professional services rendered.   Ct Renal Stone Study  07/07/2015  CLINICAL DATA:  Two week history of progressively worsening left flank pain, dysuria and hematuria. Personal history of urinary tract infections and urinary tract calculi requiring lithotripsy. Surgical history also includes a gastric sleeve procedure for obesity. EXAM: CT ABDOMEN AND PELVIS WITHOUT CONTRAST TECHNIQUE: Multidetector CT imaging of the abdomen and pelvis was performed following the standard protocol without IV contrast. COMPARISON:  Ten prior CT abdomen and pelvis examinations dating back to November, 2005, most recently 07/09/2014. FINDINGS: Lower chest:  Heart size normal.  Visualized lung bases clear. Hepatobiliary: Normal unenhanced appearance of the liver. Gallbladder normal in appearance without calcified gallstones. No biliary ductal dilation. Pancreas: Normal unenhanced appearance. Spleen: Normal unenhanced appearance. Adrenals/Urinary Tract: Normal appearing adrenal glands. Chronic left hydronephrosis. No evidence of left ureteral dilation. No obstructing left ureteral calculus. Numerous small stone fragments in lower pole calices of the left kidney, the largest approximating 5 mm. Asymmetric enlargement of the left kidney relative to the right, unchanged. Gas within an independent upper pole calix of the left kidney, a new finding. Normal unenhanced appearance of the right kidney. No right urinary tract calculi. Gas within the otherwise normal-appearing  urinary bladder. Stomach/Bowel: Postsurgical changes related to the prior gastric sleeve procedure. No complicating features. Normal-appearing small bowel. Large colonic stool burden. Tortuous, redundant sigmoid colon. No evidence of diverticulosis or other focal colonic abnormality. Appendix not clearly visualized, but no pericecal inflammation. Vascular/Lymphatic: No visible aortoiliofemoral atherosclerosis. No pathologic lymphadenopathy. Reproductive: Uterus surgically absent.  No adnexal masses. Other: None. Musculoskeletal: Degenerative disc disease at L4-5 and L5-S1 with likely chronic disc protrusions as there is calcification in the posterior annular fibers at both levels. Facet degenerative changes at these levels. IMPRESSION: 1. Chronic left hydronephrosis dating back to at least 2005 with numerous nonobstructing stone fragments in lower pole calices, the largest fragment approximating 5 mm. 2. Gas within an upper pole calix of the left kidney and gas within the urinary bladder. In the absence of recent instrumentation, infection with a gas forming organism is suspected. 3. No evidence of right urinary tract calculi. 4. Postsurgical changes related to the prior gastric sleeve procedure, without complication. 5. Large colonic stool burden. Electronically Signed   By: Hulan Saas M.D.   On: 07/07/2015 20:26    EKG:  Orders placed or performed in visit on 07/19/12  . EKG 12-Lead    ASSESSMENT AND PLAN:  Principal Problem:   Pyelonephritis Active Problems:   Kidney stones   Chronic constipation 1. Acute emphysematous left pyelonephritis, change antibiotic to Zosyn, urology consultation is appreciated, now status post nephrostomy tube placement on the left by radiologist, Dr. Lowella Dandy, Following clinically 2. Hypokalemia supplementing intravenously and orally , magnesium level was normal  3. Kidney stones , urology follow-up will be needed , status post nephrostomy tube placement on the left   4. Urinary spasms , Ditropan  5. Relative hypotension  advance IV fluids to 125 mL an hour ,  Management plans discussed with the patient, family and they are in agreement.   DRUG ALLERGIES:  Allergies  Allergen Reactions  . Iodinated Diagnostic Agents Nausea Only    CODE STATUS:     Code Status Orders        Start     Ordered   07/07/15 2356  Full  code   Continuous     07/07/15 2355      TOTAL TIME TAKING CARE OF THIS PATIENT: 40tes.    Katharina CaperVAICKUTE,Farin Buhman M.D on 07/08/2015 at 3:18 PM  Between 7am to 6pm - Pager - [phone]  After 6pm go to [email or web address] - password [email or web address]  [facility]  Office  [phone]  CC: Primary care physician; Pcp Not In System

## 2015-07-08 NOTE — Consult Note (Signed)
Urology Consult  Referring physician: Dr. Ether Griffins Reason for referral: Hydronephrosis, pyelonephritis, renal calculi  Chief Complaint: Left flank pain  History of Present Illness: Katelyn Holland is a 39yo with a hx of nephrolithiasis admitted yesterday with severe left flank pain, left hydronephrosis. 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 She has had numerous stone events. She was previously seen by Dr. Jacqlyn Larsen and has undergo ESWL and PCNL on the left. She has a chronic left UPJ obstruction. The pain is sharp, constant, severe, nonradiating left flank pain. The pain started 3-4 days ago. She underwent L nephrostomy tube placement and the patient reports improvement in her pain since the procedure.  She denies any fevers.  Past Medical History  Diagnosis Date  . Kidney stone   . Gravida 1 para 1   . Chronic constipation    Past Surgical History  Procedure Laterality Date  . Back surgery    . Abdominal hysterectomy    . Laparoscopic gastric sleeve resection    . Kidney stone surgery      Medications: I have reviewed the patient's current medications. Allergies:  Allergies  Allergen Reactions  . Iodinated Diagnostic Agents Nausea Only    Family History  Problem Relation Age of Onset  . Heart disease Mother 28  . Cancer Maternal Grandmother    Social History:  reports that she has never smoked. She has never used smokeless tobacco. She reports that she drinks alcohol. She reports that she does not use illicit drugs.  Review of Systems  Constitutional: Positive for malaise/fatigue.  Gastrointestinal: Positive for nausea.  Genitourinary: Positive for dysuria, urgency, frequency and flank pain.    Physical Exam:  Vital signs in last 24 hours: Temp:  [98 F (36.7 C)-98.4 F (36.9 C)] 98.4 F (36.9 C) (12/28 1349) Pulse Rate:  [69-103] 88 (12/28 1641) Resp:  [8-24] 20 (12/28 1641) BP: (103-158)/(61-84) 120/71 mmHg (12/28  1641) SpO2:  [97 %-100 %] 99 % (12/28 1641) Weight:  [70.943 kg (156 lb 6.4 oz)] 70.943 kg (156 lb 6.4 oz) (12/27 2351) Physical Exam  Constitutional: She is oriented to person, place, and time. She appears well-developed and well-nourished.  HENT:  Head: Normocephalic and atraumatic.  Eyes: EOM are normal. Pupils are equal, round, and reactive to light.  Neck: Normal range of motion. No thyromegaly present.  Cardiovascular: Normal rate and regular rhythm.   Respiratory: Effort normal. No respiratory distress.  GI: Soft. She exhibits no distension and no mass. There is no tenderness. There is no rebound and no guarding.  Musculoskeletal: Normal range of motion.  Neurological: She is alert and oriented to person, place, and time.  Skin: Skin is warm and dry.  Psychiatric: She has a normal mood and affect. Her behavior is normal. Judgment and thought content normal.    Laboratory Data:  Results for orders placed or performed during the hospital encounter of 07/07/15 (from the past 72 hour(s))  Basic metabolic panel     Status: Abnormal   Collection Time: 07/07/15  4:56 PM  Result Value Ref Range   Sodium 141 135 - 145 mmol/L   Potassium 3.1 (L) 3.5 - 5.1 mmol/L   Chloride 103 101 - 111 mmol/L   CO2 30 22 - 32 mmol/L   Glucose, Bld 130 (H) 65 - 99 mg/dL   BUN 11 6 - 20 mg/dL   Creatinine, Ser 0.55 0.44 - 1.00 mg/dL   Calcium 9.1 8.9 - 10.3 mg/dL  GFR calc non Af Amer >60 >60 mL/min   GFR calc Af Amer >60 >60 mL/min    Comment: (NOTE) The eGFR has been calculated using the CKD EPI equation. This calculation has not been validated in all clinical situations. eGFR's persistently <60 mL/min signify possible Chronic Kidney Disease.    Anion gap 8 5 - 15  CBC     Status: None   Collection Time: 07/07/15  4:56 PM  Result Value Ref Range   WBC 6.2 3.6 - 11.0 K/uL   RBC 4.52 3.80 - 5.20 MIL/uL   Hemoglobin 12.7 12.0 - 16.0 g/dL   HCT 37.4 35.0 - 47.0 %   MCV 82.7 80.0 - 100.0 fL    MCH 28.2 26.0 - 34.0 pg   MCHC 34.0 32.0 - 36.0 g/dL   RDW 12.2 11.5 - 14.5 %   Platelets 228 150 - 440 K/uL  Urinalysis complete, with microscopic- may I&O cath if menses [facility] only)     Status: Abnormal   Collection Time: 07/07/15  4:56 PM  Result Value Ref Range   Color, Urine YELLOW (A) YELLOW   APPearance CLOUDY (A) CLEAR   Glucose, UA NEGATIVE NEGATIVE mg/dL   Bilirubin Urine NEGATIVE NEGATIVE   Ketones, ur NEGATIVE NEGATIVE mg/dL   Specific Gravity, Urine 1.015 1.005 - 1.030   Hgb urine dipstick 1+ (A) NEGATIVE   pH 5.0 5.0 - 8.0   Protein, ur 100 (A) NEGATIVE mg/dL   Nitrite NEGATIVE NEGATIVE   Leukocytes, UA 3+ (A) NEGATIVE   RBC / HPF 6-30 0 - 5 RBC/hpf   WBC, UA TOO NUMEROUS TO COUNT 0 - 5 WBC/hpf   Bacteria, UA MANY (A) NONE SEEN   Squamous Epithelial / LPF 0-5 (A) NONE SEEN   WBC Clumps PRESENT    Mucous PRESENT   Urine culture     Status: None (Preliminary result)   Collection Time: 07/07/15  4:56 PM  Result Value Ref Range   Specimen Description URINE, RANDOM    Special Requests NONE    Culture      >=100,000 COLONIES/mL GRAM NEGATIVE RODS IDENTIFICATION AND SUSCEPTIBILITIES TO FOLLOW    Report Status PENDING   Pregnancy, urine POC     Status: None   Collection Time: 07/07/15  5:05 PM  Result Value Ref Range   Preg Test, Ur NEGATIVE NEGATIVE    Comment:        THE SENSITIVITY OF THIS METHODOLOGY IS >24 mIU/mL   Basic metabolic panel     Status: Abnormal   Collection Time: 07/08/15  5:16 AM  Result Value Ref Range   Sodium 143 135 - 145 mmol/L   Potassium 3.3 (L) 3.5 - 5.1 mmol/L   Chloride 110 101 - 111 mmol/L   CO2 29 22 - 32 mmol/L   Glucose, Bld 97 65 - 99 mg/dL   BUN 13 6 - 20 mg/dL   Creatinine, Ser 0.46 0.44 - 1.00 mg/dL   Calcium 8.3 (L) 8.9 - 10.3 mg/dL   GFR calc non Af Amer >60 >60 mL/min   GFR calc Af Amer >60 >60 mL/min    Comment: (NOTE) The eGFR has been calculated using the CKD EPI equation. This calculation has not been  validated in all clinical situations. eGFR's persistently <60 mL/min signify possible Chronic Kidney Disease.    Anion gap 4 (L) 5 - 15  CBC     Status: Abnormal   Collection Time: 07/08/15  5:16 AM  Result Value Ref  Range   WBC 5.8 3.6 - 11.0 K/uL   RBC 4.08 3.80 - 5.20 MIL/uL   Hemoglobin 11.5 (L) 12.0 - 16.0 g/dL   HCT 33.7 (L) 35.0 - 47.0 %   MCV 82.8 80.0 - 100.0 fL   MCH 28.2 26.0 - 34.0 pg   MCHC 34.1 32.0 - 36.0 g/dL   RDW 12.1 11.5 - 14.5 %   Platelets 198 150 - 440 K/uL  Magnesium     Status: None   Collection Time: 07/08/15  5:16 AM  Result Value Ref Range   Magnesium 1.9 1.7 - 2.4 mg/dL  Protime-INR     Status: None   Collection Time: 07/08/15  8:52 AM  Result Value Ref Range   Prothrombin Time 14.4 11.4 - 15.0 seconds   INR 1.10    Recent Results (from the past 240 hour(s))  Urine culture     Status: None (Preliminary result)   Collection Time: 07/07/15  4:56 PM  Result Value Ref Range Status   Specimen Description URINE, RANDOM  Final   Special Requests NONE  Final   Culture   Final    >=100,000 COLONIES/mL GRAM NEGATIVE RODS IDENTIFICATION AND SUSCEPTIBILITIES TO FOLLOW    Report Status PENDING  Incomplete   Creatinine:  Recent Labs  07/07/15 1656 07/08/15 0516  CREATININE 0.55 0.46   Baseline Creatinine: unknown  Impression/Assessment:  39yo with emphysematous pyelitis, nephrolithiais, UPJ obstruction  Plan:  1. Continue Left nephrostomy tube to straight drain 2. Continue rocephin pending urine culture 3. Nephrolithiasis: We discussed treatment which would involve 2 weeks of culture specific antibiotics followed by PCNL 4. Left UPJ obstruction: I discussed management which involves stone removal followed by ureteroscopy with possible stenting which will be scheduled after discharge  Helen Winterhalter L 07/08/2015, 8:47 PM

## 2015-07-09 DIAGNOSIS — E876 Hypokalemia: Secondary | ICD-10-CM

## 2015-07-09 DIAGNOSIS — N135 Crossing vessel and stricture of ureter without hydronephrosis: Secondary | ICD-10-CM

## 2015-07-09 DIAGNOSIS — N1330 Unspecified hydronephrosis: Secondary | ICD-10-CM

## 2015-07-09 DIAGNOSIS — D649 Anemia, unspecified: Secondary | ICD-10-CM

## 2015-07-09 LAB — URINE CULTURE: Culture: >=100000

## 2015-07-09 LAB — POTASSIUM: POTASSIUM: 4.1 mmol/L (ref 3.5–5.1)

## 2015-07-09 LAB — HEMOGLOBIN: Hemoglobin: 10.9 g/dL — ABNORMAL LOW (ref 12.0–16.0)

## 2015-07-09 MED ORDER — SULFAMETHOXAZOLE-TRIMETHOPRIM 800-160 MG PO TABS: 1.0000 | ORAL_TABLET | Freq: Two times a day (BID) | ORAL | Status: DC

## 2015-07-09 MED ORDER — ENSURE ENLIVE PO LIQD
237.0000 mL | Freq: Two times a day (BID) | ORAL | Status: DC
  Administered 2015-07-09 (×2): 237 mL via ORAL

## 2015-07-09 MED ORDER — CEPHALEXIN 500 MG PO CAPS: 500.0000 mg | ORAL_CAPSULE | Freq: Four times a day (QID) | ORAL | Status: DC

## 2015-07-09 MED ORDER — OXYBUTYNIN CHLORIDE 5 MG PO TABS: 5.0000 mg | ORAL_TABLET | Freq: Three times a day (TID) | ORAL | Status: DC

## 2015-07-09 NOTE — Progress Notes (Signed)
VSS. Tramadol given once for L flank pain with improvement. Pt is discharged home. Discharged instructions given and explained to pt. Follow up appointments, medications and prescriptions reviewed with pt and spouse. Work note given to pt.Educational handouts about low sodium diet and oxybutynin given and explained to pt. Pt verbalized understanding. Pt instructed how to empty nephrostomy bag. No additional nephrostomy tube care at this time per urologist.

## 2015-07-09 NOTE — Discharge Summary (Addendum)
[facility] [place] at 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 Kidney stone   . Gravida 1 para 1   . Chronic constipation     .pro HOSPITAL COURSE:   Patient is a 39 year old Caucasian female with history of kidney stones, patient of Dr. Achilles Dunk who presents to the hospital with complaints of left flank pain and left hydronephrosis. She had CT scan which showed emphysematous pyelonephritis, and multiple left lower pole calculi. She underwent left nephrostomy tube placement and her pain subsided. Renal cultures were growing Escherichia coli, pansensitive. Patient was given antibiotics intravenously while she was in the hospital and is being discharged on Bactrim orally for 2 weeks therapy. She is to follow-up with urologist as outpatient for further recommendations. Discussion by problem 1. 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 Lowella Dandy, follow-up with urologist for recommendations of removal of nephrostomy tube 3. Hypokalemia, supplemented and resolved 4. Anemia, seems to be stable with rehydration 5. Nephrolithiasis, patient is to  follow-up with urologist for recommendations and and therapy of nephrolithiasis   DISCHARGE CONDITIONS:   Stable  CONSULTS OBTAINED:  Treatment Team:  Malen Gauze, MD  DRUG ALLERGIES:   Allergies  Allergen Reactions  . Iodinated Diagnostic Agents Nausea Only    DISCHARGE MEDICATIONS:   Discharge Medication List as of 07/09/2015  1:53 PM    START taking these medications   Details  oxybutynin (DITROPAN) 5 MG tablet Take 1 tablet (5 mg total) by mouth 3 (three) times daily., Starting 07/09/2015, Until Discontinued, Normal    sulfamethoxazole-trimethoprim (BACTRIM DS) 800-160 MG tablet Take 1 tablet by mouth 2 (two) times daily., Starting 07/09/2015, Until Discontinued, Normal      CONTINUE these medications which have NOT CHANGED   Details  acyclovir (ZOVIRAX) 400 MG tablet Take 1 tablet (400 mg total) by mouth 2 (two) times daily., Starting 05/21/2015, Until Discontinued, Normal    cyclobenzaprine (FLEXERIL) 10 MG tablet Take 1 tablet (10 mg total) by mouth at bedtime., Starting 05/21/2015, Until Discontinued, Normal    Magnesium 400 MG TABS Take 1 tablet by mouth at bedtime., Starting 12/24/2014, Until Discontinued, OTC    ondansetron (ZOFRAN) 4 MG tablet Take 1 tablet (4 mg total) by mouth daily as needed for nausea or vomiting., Starting 04/09/2015, Until Discontinued, Print    oxyCODONE-acetaminophen (ROXICET) 5-325 MG tablet Take 1 tablet by mouth every 4 (four) hours as needed for severe pain., Starting 04/09/2015, Until Discontinued, Print    tamsulosin (FLOMAX) 0.4 MG CAPS capsule Take 1 capsule (0.4 mg total) by mouth daily., Starting 04/09/2015, Until Discontinued, Print    traMADol (ULTRAM) 50 MG tablet Take 1 tablet (50 mg total) by mouth every 8 (eight)  hours as needed for moderate pain., Starting 01/29/2015, Until Fri 01/29/16, Print         DISCHARGE INSTRUCTIONS:    Patient is to follow up with [facility] urology as outpatient  If you experience  worsening of your admission symptoms, develop shortness of breath, life threatening emergency, suicidal or homicidal thoughts you must seek medical attention immediately by calling 911 or calling your MD immediately  if symptoms less severe.  You Must read complete instructions/literature along with all the possible adverse reactions/side effects for all the Medicines you take and that have been prescribed to you. Take any new Medicines after you have completely understood and accept all the possible adverse reactions/side effects.   Please note  You were cared for by a hospitalist during your hospital stay. If you have any questions about your discharge medications or the care you received while you were in the hospital after you are discharged, you can call the unit and asked to speak with the hospitalist on call if the hospitalist that took care of you is not available. Once you are discharged, your primary care physician will handle any further medical issues. Please note that NO REFILLS for any discharge medications will be authorized once you are discharged, as it is imperative that you return to your primary care physician (or establish a relationship with a primary care physician if you do not have one) for your aftercare needs so that they can reassess your need for medications and monitor your lab values.    Today   CHIEF COMPLAINT:   Chief Complaint  Patient presents with  . Flank Pain  . Dysuria    HISTORY OF PRESENT ILLNESS:  Katelyn Holland  is a 38 y.o. female with a known history of kidney stones, patient of Dr. Achilles Dunk who presents to the hospital with complaints of left flank pain and left hydronephrosis. She had CT scan which showed emphysematous pyelonephritis, and multiple left lower pole calculi. She underwent left nephrostomy tube placement and her pain subsided. Renal cultures were growing Escherichia coli, pansensitive. Patient was given antibiotics intravenously while she  was in the hospital and is being discharged on Bactrim orally for 2 weeks therapy. She is to follow-up with urologist as outpatient for further recommendations. Discussion by problem 1. 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 Lowella Dandy, follow-up with urologist for recommendations of removal of nephrostomy tube 3. Hypokalemia, supplemented and resolved 4. Anemia, seems to be stable with rehydration 5. Nephrolithiasis, patient is to follow-up with urologist for recommendations and and therapy of nephrolithiasis     VITAL SIGNS:  Blood pressure 107/51, pulse 87, temperature 98.9 F (37.2 C), temperature source Oral, resp. rate 20, height 5\' 5"  (1.651 m), weight 70.943 kg (156 lb 6.4 oz), SpO2 99 %.  I/O:    Intake/Output Summary (Last 24 hours) at 07/09/15 1555 Last data filed at 07/09/15 1428  Gross per 24 hour  Intake    360 ml  Output   2375 ml  Net  -2015 ml    PHYSICAL EXAMINATION:  GENERAL:  39 y.o.-year-old patient lying in the bed with no acute distress.  EYES: Pupils equal, round, reactive to light and accommodation. No scleral icterus. Extraocular muscles intact.  HEENT: Head atraumatic, normocephalic. Oropharynx and nasopharynx clear.  NECK:  Supple,  no jugular venous distention. No thyroid enlargement, no tenderness.  LUNGS: Normal breath sounds bilaterally, no wheezing, rales,rhonchi or crepitation. No use of accessory muscles of respiration.  CARDIOVASCULAR: S1, S2 normal. No murmurs, rubs, or gallops.  ABDOMEN: Soft, non-tender, non-distended. Bowel sounds present. No organomegaly or mass.  EXTREMITIES: No pedal edema, cyanosis, or clubbing.  NEUROLOGIC: Cranial nerves II through XII are intact. Muscle strength  5/5 in all extremities. Sensation intact. Gait not checked.  PSYCHIATRIC: The patient is alert and oriented x 3.  SKIN: No obvious rash, lesion, or ulcer. Left nephrostomy tube draining bloody, amber color urine  DATA REVIEW:   CBC  Recent Labs Lab 07/08/15 0516 07/09/15 0733  WBC 5.8  --   HGB 11.5* 10.9*  HCT 33.7*  --   PLT 198  --     Chemistries   Recent Labs Lab 07/08/15 0516 07/09/15 0733  NA 143  --   K 3.3* 4.1  CL 110  --   CO2 29  --   GLUCOSE 97  --   BUN 13  --   CREATININE 0.46  --   CALCIUM 8.3*  --   MG 1.9  --     Cardiac Enzymes No results for input(s): TROPONINI in the last 168 hours.  Microbiology Results  Results for orders placed or performed during the hospital encounter of 07/07/15  Urine culture     Status: None   Collection Time: 07/07/15  4:56 PM  Result Value Ref Range Status   Specimen Description URINE, RANDOM  Final   Special Requests NONE  Final   Culture >=100,000 COLONIES/mL ESCHERICHIA COLI  Final   Report Status 07/09/2015 FINAL  Final   Organism ID, Bacteria ESCHERICHIA COLI  Final      Susceptibility   Escherichia coli - MIC*    AMPICILLIN 8 SENSITIVE Sensitive     CEFTAZIDIME <=1 SENSITIVE Sensitive     CEFAZOLIN <=4 SENSITIVE Sensitive     CEFTRIAXONE <=1 SENSITIVE Sensitive     CIPROFLOXACIN <=0.25 SENSITIVE Sensitive     GENTAMICIN 2 SENSITIVE Sensitive     IMIPENEM <=0.25 SENSITIVE Sensitive     TRIMETH/SULFA <=20 SENSITIVE Sensitive     Extended ESBL NEGATIVE Sensitive     PIP/TAZO Value in next row Sensitive      SENSITIVE<=4    ERTAPENEM Value in next row Sensitive      SENSITIVE<=0.5    LEVOFLOXACIN Value in next row Sensitive      SENSITIVE<=0.12    NITROFURANTOIN Value in next row Sensitive      SENSITIVE<=16    * >=100,000 COLONIES/mL ESCHERICHIA COLI  Urine culture     Status: None (Preliminary result)   Collection Time: 07/08/15  3:00 PM  Result Value Ref Range Status   Specimen Description  URINE, RANDOM  Final   Special Requests NONE  Final   Culture   Final    >=100,000 COLONIES/mL GRAM NEGATIVE RODS IDENTIFICATION AND SUSCEPTIBILITIES TO FOLLOW    Report Status PENDING  Incomplete    RADIOLOGY:  [place] Intraoperative  07/08/2015  CLINICAL DATA:  Ultrasound was provided for use by the ordering physician, and a technical charge was applied by the performing facility.  No radiologist interpretation/professional services rendered.   Ct Renal Stone Study  07/07/2015  CLINICAL DATA:  Two week history of progressively worsening left flank pain, dysuria and hematuria. Personal history of urinary tract infections and urinary tract calculi requiring lithotripsy. Surgical history also includes a gastric  sleeve procedure for obesity. EXAM: CT ABDOMEN AND PELVIS WITHOUT CONTRAST TECHNIQUE: Multidetector CT imaging of the abdomen and pelvis was performed following the standard protocol without IV contrast. COMPARISON:  Ten prior CT abdomen and pelvis examinations dating back to November, 2005, most recently 07/09/2014. FINDINGS: Lower chest:  Heart size normal.  Visualized lung bases clear. Hepatobiliary: Normal unenhanced appearance of the liver. Gallbladder normal in appearance without calcified gallstones. No biliary ductal dilation. Pancreas: Normal unenhanced appearance. Spleen: Normal unenhanced appearance. Adrenals/Urinary Tract: Normal appearing adrenal glands. Chronic left hydronephrosis. No evidence of left ureteral dilation. No obstructing left ureteral calculus. Numerous small stone fragments in lower pole calices of the left kidney, the largest approximating 5 mm. Asymmetric enlargement of the left kidney relative to the right, unchanged. Gas within an independent upper pole calix of the left kidney, a new finding. Normal unenhanced appearance of the right kidney. No right urinary tract calculi. Gas within the otherwise normal-appearing urinary bladder. Stomach/Bowel: Postsurgical changes  related to the prior gastric sleeve procedure. No complicating features. Normal-appearing small bowel. Large colonic stool burden. Tortuous, redundant sigmoid colon. No evidence of diverticulosis or other focal colonic abnormality. Appendix not clearly visualized, but no pericecal inflammation. Vascular/Lymphatic: No visible aortoiliofemoral atherosclerosis. No pathologic lymphadenopathy. Reproductive: Uterus surgically absent.  No adnexal masses. Other: None. Musculoskeletal: Degenerative disc disease at L4-5 and L5-S1 with likely chronic disc protrusions as there is calcification in the posterior annular fibers at both levels. Facet degenerative changes at these levels. IMPRESSION: 1. Chronic left hydronephrosis dating back to at least 2005 with numerous nonobstructing stone fragments in lower pole calices, the largest fragment approximating 5 mm. 2. Gas within an upper pole calix of the left kidney and gas within the urinary bladder. In the absence of recent instrumentation, infection with a gas forming organism is suspected. 3. No evidence of right urinary tract calculi. 4. Postsurgical changes related to the prior gastric sleeve procedure, without complication. 5. Large colonic stool burden. Electronically Signed   By: Hulan Saashomas  Lawrence M.D.   On: 07/07/2015 20:26   Ir Nephrostomy Placement Left  07/08/2015  CLINICAL DATA:  39 year old with left hydronephrosis, urinary tract infection and kidney stones. Patient needs a percutaneous nephrostomy tube for adequate urinary decompression. EXAM: PLACEMENT OF LEFT PERCUTANEOUS NEPHROSTOMY TUBE WITH ULTRASOUND AND FLUOROSCOPIC GUIDANCE Physician: Rachelle HoraAdam R. Henn, MD FLUOROSCOPY TIME:  7 minutes and 24 seconds MEDICATIONS: 3.5 mg Versed, 100 mcg fentanyl. A radiology nurse monitored the patient for moderate sedation. Patient was already on IV antibiotics. No additional antibiotics were given. ANESTHESIA/SEDATION: Moderate sedation time:  30 minutes CONTRAST:  2 mL  Omnipaque 300 PROCEDURE: The procedure was explained to the patient. The risks and benefits of the procedure were discussed and the patient's questions were addressed. Informed consent was obtained from the patient. Patient was placed prone on the interventional table. The left flank was prepped and draped in usual sterile fashion. Maximal barrier sterile technique was utilized including caps, mask, sterile gowns, sterile gloves, sterile drape, hand hygiene and skin antiseptic. Left kidney was identified with ultrasound. Skin was anesthetized with 1% lidocaine. A 22 gauge Chiba needle was directed into dilated lower pole calyx with ultrasound guidance. Small amount of urine was aspirated. An Accustick dilator set was placed. Small amount of contrast was injected in order to identify the infundibulum. Accustick set was exchanged for a 5 [place] catheter. Catheter and Bentson wire were advanced into the renal pelvis. Tract was dilated to accommodate a 10 [place] multipurpose drain. Approximately  30 mL of blood-tinged urine was aspirated. Small amount of contrast was injected to confirm placement in the renal pelvis. Catheter was sutured to the skin and attached to gravity bag. Bandage placed over the tube. FINDINGS: Moderate hydronephrosis. Multiple stones in the lower pole. Access obtained from the lower pole calyx. Nephrostomy tube in the renal pelvis at the end of the procedure. Estimated blood loss: Minimal COMPLICATIONS: None IMPRESSION: Successful placement of a left percutaneous nephrostomy tube using ultrasound and fluoroscopic guidance. Access obtained from the lower pole calyx. Multiple stones in the lower pole calyx. Urine was sent for culture. Electronically Signed   By: Richarda Overlie M.D.   On: 07/08/2015 15:42    EKG:   Orders placed or performed in visit on 07/19/12  . EKG 12-Lead      Management plans discussed with the patient, family and they are in agreement.  CODE STATUS:     Code Status  Orders        Start     Ordered   07/07/15 2356  Full code   Continuous     07/07/15 2355      TOTAL TIME TAKING CARE OF THIS PATIENT: 40  minutes.    Katharina Caper M.D on 07/09/2015 at 3:55 PM  Between 7am to 6pm - Pager - [phone]  After 6pm go to [email or web address] - password [email or web address]  [facility]  Office  [phone]  CC: Primary care physician; Pcp Not In System

## 2015-07-09 NOTE — Progress Notes (Signed)
  Treatment plan: 1. Urine culture returned pan sensitive Ecoli. She should be discharged on 14 days of bactrim and she should followup with [facility] 1 week after discharged to schedule PCNL 2. Please call with any additional questions  Dionisios Ricci L

## 2015-07-09 NOTE — Progress Notes (Signed)
       To Whom It May Concern:       Mrs. Katelyn Holland was hospitalized at [facility] on the 27th of December 2016 and underwent surgery on the 28th of  December 2016.  She is to be excused from work through 07/21/2015. Thank you for your understanding.       Sincerely,  Katharina Caperima Zayven Powe, MD

## 2015-07-09 NOTE — Plan of Care (Signed)
Problem: Safety: Goal: Ability to remain free from injury will improve Outcome: Progressing Ambulates with assist.  Problem: Health Behavior/Discharge Planning: Goal: Ability to manage health-related needs will improve Outcome: Progressing Left Nephrostomy tube in place with adequate output.  Problem: Pain Managment: Goal: General experience of comfort will improve Outcome: Progressing Complained of pain, morphine IV given with improvement.  Problem: Physical Regulation: Goal: Will remain free from infection Outcome: Progressing Continues ABX.

## 2015-07-09 NOTE — Progress Notes (Signed)
   07/09/15 1030  Clinical Encounter Type  Visited With Patient and family together  Visit Type Initial  Provided pastoral presence and support to patient and family member on unit.  [employer] GroupChaplain Rigley Niess-pager [phone]

## 2015-07-10 LAB — URINE CULTURE

## 2015-07-21 ENCOUNTER — Encounter: Payer: Self-pay | Admitting: Urology

## 2015-07-21 ENCOUNTER — Ambulatory Visit (INDEPENDENT_AMBULATORY_CARE_PROVIDER_SITE_OTHER): Payer: BLUE CROSS/BLUE SHIELD | Admitting: Urology

## 2015-07-21 VITALS — BP 128/78 | HR 114 | Ht 63.0 in | Wt 154.7 lb

## 2015-07-21 DIAGNOSIS — N200 Calculus of kidney: Secondary | ICD-10-CM | POA: Diagnosis not present

## 2015-07-21 LAB — URINALYSIS, COMPLETE
Bilirubin, UA: NEGATIVE
Glucose, UA: NEGATIVE
Ketones, UA: NEGATIVE
NITRITE UA: NEGATIVE
PH UA: 6 (ref 5.0–7.5)
Specific Gravity, UA: 1.025 (ref 1.005–1.030)
UUROB: 0.2 mg/dL (ref 0.2–1.0)

## 2015-07-21 LAB — MICROSCOPIC EXAMINATION

## 2015-07-21 MED ORDER — SULFAMETHOXAZOLE-TRIMETHOPRIM 800-160 MG PO TABS: 1.0000 | ORAL_TABLET | Freq: Two times a day (BID) | ORAL | Status: DC

## 2015-07-21 MED ORDER — TRAMADOL HCL 50 MG PO TABS: 50.0000 mg | ORAL_TABLET | Freq: Four times a day (QID) | ORAL | Status: DC | PRN

## 2015-07-21 NOTE — Progress Notes (Signed)
40 year old white female who presents today in follow-up from the hospital. The patient was hospitalized on 07/08/15 for emphysematous pyelonephritis and pyelitis. 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 She had a nephrostomy tube placed at the time of her hospitalization. Her urine culture grew  >100,000 CFU/hpf Escherichia coli, pansensitive. She was discharged home on 2 weeks of Bactrim. She continues on Bactrim today. The patient has not had any ongoing fevers or chills. Her pain has improved significantly. The urine in her nephrostomy tube is cleared. The patient has a long history of kidney stones. She was treated by Dr. Achilles Dunkope here in [place] as well as Dr. Bryson HaPreminger at [facility]. She has not been seen by urologist in a few years.  At one point, she had undergone proper segment of metabolic stone evaluation and was recommended to start potassium citrate tablets, which she is not taking because of the expense associated with it as well as the size of the pills.  The patient's past history is significant for gastric bypass surgery, gastric sleeve operation. This was performed one year prior. In addition, the patient had an appendectomy as a child, 2 back surgeries, and a hysterectomy.  Current Outpatient Prescriptions on File Prior to Visit  Medication Sig Dispense Refill  . cyclobenzaprine (FLEXERIL) 10 MG tablet Take 1 tablet (10 mg total) by mouth at bedtime. 30 tablet 3  . oxybutynin (DITROPAN) 5 MG tablet Take 1 tablet (5 mg total) by mouth 3 (three) times daily. 90 tablet 0  . sulfamethoxazole-trimethoprim (BACTRIM DS) 800-160 MG tablet Take 1 tablet by mouth 2 (two) times daily. 28 tablet 0  . acyclovir (ZOVIRAX) 400 MG tablet Take 1 tablet (400 mg total) by mouth 2 (two) times daily. (Patient not taking: Reported on 07/21/2015) 20 tablet 5  . ondansetron (ZOFRAN) 4 MG tablet Take 1 tablet (4 mg total) by mouth daily as needed for nausea or  vomiting. (Patient not taking: Reported on 07/21/2015) 20 tablet 0   No current facility-administered medications on file prior to visit.   Past Medical History  Diagnosis Date  . Kidney stone   . Gravida 1 para 1   . Chronic constipation   . Arthritis    Past Surgical History  Procedure Laterality Date  . Back surgery    . Abdominal hysterectomy    . Laparoscopic gastric sleeve resection  2016  . Lithotripsy  2007  . Appendectomy     PE: Filed Vitals:   07/21/15 1325  BP: 128/78  Pulse: 114   NAD Normal breath sounds, posteriorly both sides. Regular rate and rhythm, no murmurs appreciated Left-sided nephrostomy tube is dressed and secured cleanly to the patient's skin. The area around it is nontender. I did not take the dressing down to evaluate the site. The urine draining is clear yellow urine. The abdomen is soft, nontender, nondistended. Extremities are symmetric, no appreciable edema  Urinalysis today demonstrates no clear evidence of infection. A urine culture will be sent from the nephrostomy tube. I have independently reviewed the patient's CT scan which demonstrates left-sided hydronephrosis, emphysematous pyelitis, and a large stone burden in the lower pole. Images obtained during the nephrostomy tube placement suggest a lower pole access.  Impression: The patient has a large left lower pole stone burden, appears to have improved from her pansensitive Escherichia coli emphysematous pyelitis. Hydronephrosis is either from an obstructing stone or potentially a left UPJ obstruction. Her nephrostomy tube is clearly functioning. Her  infection appears to be well treated, the patient continues on Bactrim double strength.  Discussion: I discussed with the patient and her husband the next step in the treatment algorithim for her circumstance. Specifically, I recommended the patient undergo percutaneous left nephrolithotomy. I explained the surgery to the patient and her husband  in quite some detail. She understands that she will be prone during the operation and that the small nephrostomy tube will be exchanged for a larger tube during the surgery. She also understands the risks of bleeding, infection, and injury to his the surrounding structures including her spleen and colon. Further, she understands that she will likely have a nephrostomy tube and a stent following the operation. I explained to her the expected hospital course would be removal of the nephrostomy tube on postop day #1 after she has a CT scan to evaluate for residual stone fragments. She also understands that she may require a second operation, likely ureteroscopy so that she can be rendered stone free. I have continued the patient on Bactrim which she will take up to the day of surgery. I also sent a urine culture today. I also gave the patient a prescription for tramadol for her pain and discomfort. I written the patient to be out of work for 1 month following the operation given the physical nature of her job.

## 2015-07-22 ENCOUNTER — Encounter
Admission: RE | Admit: 2015-07-22 | Discharge: 2015-07-22 | Disposition: A | Discharge status: Home / Self Care | Payer: BLUE CROSS/BLUE SHIELD | Source: Ambulatory Visit | Attending: Urology | Admitting: Urology

## 2015-07-22 LAB — CBC
HEMATOCRIT: 36.5 % (ref 35.0–47.0)
Hemoglobin: 12.3 g/dL (ref 12.0–16.0)
MCH: 27.9 pg (ref 26.0–34.0)
MCHC: 33.7 g/dL (ref 32.0–36.0)
MCV: 82.8 fL (ref 80.0–100.0)
Platelets: 258 10*3/uL (ref 150–440)
RBC: 4.41 MIL/uL (ref 3.80–5.20)
RDW: 12.1 % (ref 11.5–14.5)
WBC: 5.2 10*3/uL (ref 3.6–11.0)

## 2015-07-22 LAB — URINALYSIS COMPLETE WITH MICROSCOPIC (ARMC ONLY)
Bilirubin Urine: NEGATIVE
GLUCOSE, UA: NEGATIVE mg/dL
Ketones, ur: NEGATIVE mg/dL
Nitrite: NEGATIVE
PROTEIN: 100 mg/dL — AB
SQUAMOUS EPITHELIAL / LPF: NONE SEEN
Specific Gravity, Urine: 1.011 (ref 1.005–1.030)
pH: 6 (ref 5.0–8.0)

## 2015-07-22 LAB — BASIC METABOLIC PANEL
Anion gap: 7 (ref 5–15)
BUN: 12 mg/dL (ref 6–20)
CALCIUM: 8.9 mg/dL (ref 8.9–10.3)
CHLORIDE: 103 mmol/L (ref 101–111)
CO2: 28 mmol/L (ref 22–32)
CREATININE: 0.75 mg/dL (ref 0.44–1.00)
GFR calc non Af Amer: >60 mL/min (ref >60)
GLUCOSE: 99 mg/dL (ref 65–99)
Potassium: 3.5 mmol/L (ref 3.5–5.1)
Sodium: 138 mmol/L (ref 135–145)

## 2015-07-22 NOTE — Pre-Procedure Instructions (Signed)
When entering pre-op orders, noted an order for urine pregnancy test, did not enter, pt has reported a hysterectomy in past.

## 2015-07-22 NOTE — Patient Instructions (Signed)
  Your procedure is scheduled on: Thursday Jan. 12, 2016. Report to Same Day Surgery. To find out your arrival time please call [phone] between 1PM - 3PM today..  Remember: Instructions that are not followed completely may result in serious medical risk, up to and including death, or upon the discretion of your surgeon and anesthesiologist your surgery may need to be rescheduled.    _x___ 1. Do not eat food or drink liquids after midnight. No gum chewing or hard candies.     _x__ 2. No Alcohol for 24 hours before or after surgery.   ____ 3. Bring all medications with you on the day of surgery if instructed.    __x__ 4. Notify your doctor if there is any change in your medical condition     (cold, fever, infections).     Do not wear jewelry, make-up, hairpins, clips or nail polish.  Do not wear lotions, powders, or perfumes. You may wear deodorant.  Do not shave 48 hours prior to surgery. Men may shave face and neck.  Do not bring valuables to the hospital.    [facility] is not responsible for any belongings or valuables.               Contacts, dentures or bridgework may not be worn into surgery.  Leave your suitcase in the car. After surgery it may be brought to your room.  For patients admitted to the hospital, discharge time is determined by your treatment team.   Patients discharged the day of surgery will not be allowed to drive home.    Please read over the following fact sheets that you were given:   [facility] Pre[facility]  ____ Take these medicines the morning of surgery with A SIP OF WATER: None     ____ Fleet Enema (as directed)   __x__ Use Sage wipes as directed  ____ Use inhalers on the day of surgery  ____ Stop metformin 2 days prior to surgery    ____ Take 1/2 of usual insulin dose the night before surgery and none on the morning of  surgery.   ____ Stop Coumadin/Plavix/aspirin on does not apply.  __x__ Stop Anti-inflammatories on does  not apply.  OK to take Tylenol or Tramadol for pain.   ____ Stop supplements until after surgery.    ____ Bring C-Pap to the hospital.

## 2015-07-23 ENCOUNTER — Inpatient Hospital Stay: Payer: BLUE CROSS/BLUE SHIELD

## 2015-07-23 ENCOUNTER — Encounter
Admission: RE | Disposition: A | Discharge status: Home / Self Care | Payer: Self-pay | Source: Ambulatory Visit | Attending: Urology

## 2015-07-23 ENCOUNTER — Inpatient Hospital Stay: Payer: BLUE CROSS/BLUE SHIELD | Admitting: Anesthesiology

## 2015-07-23 ENCOUNTER — Encounter: Payer: Self-pay | Admitting: *Deleted

## 2015-07-23 ENCOUNTER — Inpatient Hospital Stay
Admission: RE | Admit: 2015-07-23 | Discharge: 2015-07-24 | DRG: 661 | Disposition: A | Discharge status: Home / Self Care | Payer: BLUE CROSS/BLUE SHIELD | Source: Ambulatory Visit | Attending: Urology | Admitting: Urology

## 2015-07-23 DIAGNOSIS — M1990 Unspecified osteoarthritis, unspecified site: Secondary | ICD-10-CM | POA: Diagnosis present

## 2015-07-23 DIAGNOSIS — K5909 Other constipation: Secondary | ICD-10-CM | POA: Diagnosis present

## 2015-07-23 DIAGNOSIS — N3289 Other specified disorders of bladder: Secondary | ICD-10-CM | POA: Diagnosis present

## 2015-07-23 DIAGNOSIS — N200 Calculus of kidney: Secondary | ICD-10-CM

## 2015-07-23 DIAGNOSIS — Z9884 Bariatric surgery status: Secondary | ICD-10-CM | POA: Diagnosis not present

## 2015-07-23 DIAGNOSIS — N135 Crossing vessel and stricture of ureter without hydronephrosis: Secondary | ICD-10-CM | POA: Diagnosis present

## 2015-07-23 DIAGNOSIS — G8929 Other chronic pain: Secondary | ICD-10-CM | POA: Diagnosis present

## 2015-07-23 DIAGNOSIS — Z79899 Other long term (current) drug therapy: Secondary | ICD-10-CM | POA: Diagnosis not present

## 2015-07-23 DIAGNOSIS — Z419 Encounter for procedure for purposes other than remedying health state, unspecified: Secondary | ICD-10-CM

## 2015-07-23 DIAGNOSIS — M549 Dorsalgia, unspecified: Secondary | ICD-10-CM | POA: Diagnosis present

## 2015-07-23 HISTORY — PX: NEPHROLITHOTOMY: SHX5134

## 2015-07-23 LAB — TYPE AND SCREEN
ABO/RH(D): O POS
ANTIBODY SCREEN: NEGATIVE

## 2015-07-23 LAB — CULTURE, URINE COMPREHENSIVE

## 2015-07-23 LAB — ABO/RH: ABO/RH(D): O POS

## 2015-07-23 SURGERY — NEPHROLITHOTOMY PERCUTANEOUS
Anesthesia: General | Laterality: Left | Wound class: Clean

## 2015-07-23 MED ORDER — PROPOFOL 10 MG/ML IV BOLUS
INTRAVENOUS | Status: DC | PRN
  Administered 2015-07-23: 170 mg via INTRAVENOUS

## 2015-07-23 MED ORDER — FENTANYL CITRATE (PF) 100 MCG/2ML IJ SOLN
25.0000 ug | INTRAMUSCULAR | Status: AC | PRN
  Administered 2015-07-23 (×6): 25 ug via INTRAVENOUS

## 2015-07-23 MED ORDER — TAMSULOSIN HCL 0.4 MG PO CAPS
0.4000 mg | ORAL_CAPSULE | Freq: Every day | ORAL | Status: DC
Start: 2015-07-23 — End: 2015-07-24
  Administered 2015-07-23 – 2015-07-24 (×2): 0.4 mg via ORAL
  Filled 2015-07-23 (×2): qty 1

## 2015-07-23 MED ORDER — IOTHALAMATE MEGLUMINE 43 % IV SOLN
INTRAVENOUS | Status: DC | PRN
  Administered 2015-07-23: 30 mL

## 2015-07-23 MED ORDER — ONDANSETRON HCL 4 MG/2ML IJ SOLN: 4.0000 mg | Freq: Once | INTRAMUSCULAR | Status: DC | PRN

## 2015-07-23 MED ORDER — AMPICILLIN SODIUM 1 G IJ SOLR
1.0000 g | Freq: Four times a day (QID) | INTRAMUSCULAR | Status: AC
  Administered 2015-07-23 – 2015-07-24 (×3): 1 g via INTRAVENOUS
  Filled 2015-07-23 (×3): qty 1000

## 2015-07-23 MED ORDER — FENTANYL CITRATE (PF) 100 MCG/2ML IJ SOLN
INTRAMUSCULAR | Status: AC
  Filled 2015-07-23: qty 2

## 2015-07-23 MED ORDER — SODIUM CHLORIDE 0.9 % IV SOLN
1.0000 g | Freq: Once | INTRAVENOUS | Status: AC
  Administered 2015-07-23: 1 g via INTRAVENOUS

## 2015-07-23 MED ORDER — ACETAMINOPHEN 325 MG PO TABS: 650.0000 mg | ORAL_TABLET | ORAL | Status: DC | PRN

## 2015-07-23 MED ORDER — FAMOTIDINE 20 MG PO TABS
ORAL_TABLET | ORAL | Status: AC
  Administered 2015-07-23: 20 mg via ORAL
  Filled 2015-07-23: qty 1

## 2015-07-23 MED ORDER — SODIUM CHLORIDE 0.9 % IV SOLN
INTRAVENOUS | Status: AC
  Administered 2015-07-23: 1 g via INTRAVENOUS
  Filled 2015-07-23: qty 1000

## 2015-07-23 MED ORDER — SODIUM CHLORIDE 0.9 % IV SOLN
INTRAVENOUS | Status: DC
  Administered 2015-07-23 – 2015-07-24 (×3): via INTRAVENOUS

## 2015-07-23 MED ORDER — DOCUSATE SODIUM 100 MG PO CAPS
100.0000 mg | ORAL_CAPSULE | Freq: Two times a day (BID) | ORAL | Status: DC
  Administered 2015-07-23 – 2015-07-24 (×3): 100 mg via ORAL
  Filled 2015-07-23 (×3): qty 1

## 2015-07-23 MED ORDER — GENTAMICIN SULFATE 40 MG/ML IJ SOLN
240.0000 mg | Freq: Once | INTRAVENOUS | Status: AC
  Administered 2015-07-23: 240 mg via INTRAVENOUS
  Filled 2015-07-23 (×2): qty 6

## 2015-07-23 MED ORDER — OXYCODONE-ACETAMINOPHEN 5-325 MG PO TABS
1.0000 | ORAL_TABLET | ORAL | Status: DC | PRN
  Administered 2015-07-24: 1 via ORAL
  Filled 2015-07-23: qty 1

## 2015-07-23 MED ORDER — FAMOTIDINE 20 MG PO TABS
20.0000 mg | ORAL_TABLET | Freq: Once | ORAL | Status: AC
  Administered 2015-07-23: 20 mg via ORAL

## 2015-07-23 MED ORDER — BELLADONNA ALKALOIDS-OPIUM 16.2-60 MG RE SUPP
1.0000 | Freq: Four times a day (QID) | RECTAL | Status: DC | PRN
Start: 2015-07-23 — End: 2015-07-24
  Administered 2015-07-23: 1 via RECTAL
  Filled 2015-07-23: qty 1

## 2015-07-23 MED ORDER — DEXAMETHASONE SODIUM PHOSPHATE 10 MG/ML IJ SOLN
INTRAMUSCULAR | Status: DC | PRN
  Administered 2015-07-23: 10 mg via INTRAVENOUS

## 2015-07-23 MED ORDER — LIDOCAINE HCL (CARDIAC) 20 MG/ML IV SOLN
INTRAVENOUS | Status: DC | PRN
  Administered 2015-07-23: 10 mg via INTRAVENOUS

## 2015-07-23 MED ORDER — DIPHENHYDRAMINE HCL 12.5 MG/5ML PO ELIX: 12.5000 mg | ORAL_SOLUTION | Freq: Four times a day (QID) | ORAL | Status: DC | PRN

## 2015-07-23 MED ORDER — SUCCINYLCHOLINE CHLORIDE 20 MG/ML IJ SOLN
INTRAMUSCULAR | Status: DC | PRN
  Administered 2015-07-23: 100 mg via INTRAVENOUS

## 2015-07-23 MED ORDER — MORPHINE SULFATE (PF) 2 MG/ML IV SOLN
2.0000 mg | INTRAVENOUS | Status: DC | PRN
  Administered 2015-07-23: 3 mg via INTRAVENOUS
  Administered 2015-07-23: 2 mg via INTRAVENOUS
  Filled 2015-07-23: qty 1
  Filled 2015-07-23: qty 2

## 2015-07-23 MED ORDER — LACTATED RINGERS IV SOLN
INTRAVENOUS | Status: DC
Start: 2015-07-23 — End: 2015-07-23
  Administered 2015-07-23: 09:00:00 via INTRAVENOUS

## 2015-07-23 MED ORDER — ONDANSETRON HCL 4 MG/2ML IJ SOLN: 4.0000 mg | INTRAMUSCULAR | Status: DC | PRN

## 2015-07-23 MED ORDER — ONDANSETRON HCL 4 MG/2ML IJ SOLN
INTRAMUSCULAR | Status: DC | PRN
  Administered 2015-07-23: 4 mg via INTRAVENOUS

## 2015-07-23 MED ORDER — FENTANYL CITRATE (PF) 100 MCG/2ML IJ SOLN
INTRAMUSCULAR | Status: DC | PRN
  Administered 2015-07-23 (×2): 50 ug via INTRAVENOUS
  Administered 2015-07-23: 100 ug via INTRAVENOUS

## 2015-07-23 MED ORDER — ACYCLOVIR 200 MG PO CAPS
400.0000 mg | ORAL_CAPSULE | Freq: Two times a day (BID) | ORAL | Status: DC
  Administered 2015-07-23 – 2015-07-24 (×2): 400 mg via ORAL
  Filled 2015-07-23 (×2): qty 2

## 2015-07-23 MED ORDER — KETOROLAC TROMETHAMINE 30 MG/ML IJ SOLN
INTRAMUSCULAR | Status: DC | PRN
  Administered 2015-07-23: 30 mg via INTRAVENOUS

## 2015-07-23 MED ORDER — DIPHENHYDRAMINE HCL 50 MG/ML IJ SOLN: 12.5000 mg | Freq: Four times a day (QID) | INTRAMUSCULAR | Status: DC | PRN

## 2015-07-23 MED ORDER — MIDAZOLAM HCL 2 MG/2ML IJ SOLN
INTRAMUSCULAR | Status: DC | PRN
  Administered 2015-07-23: 2 mg via INTRAVENOUS

## 2015-07-23 SURGICAL SUPPLY — 70 items
ADAPTER IRRIG TUBE 2 SPIKE SOL (ADAPTER) ×4 IMPLANT
ADAPTER SCOPE UROLOK II (MISCELLANEOUS) ×2 IMPLANT
BAG DRAIN CYSTO-URO LG1000N (MISCELLANEOUS) ×2 IMPLANT
BAG URO DRAIN 2000ML W/SPOUT (MISCELLANEOUS) IMPLANT
BALLN NEPHROMAX 24X8X12 (CATHETERS) ×2
BALLOON NEPHROMAX 24X8X12 (CATHETERS) ×1 IMPLANT
BASKET ZERO TIP 1.9FR (BASKET) ×2 IMPLANT
BLADE SURG 15 STRL LF DISP TIS (BLADE) ×1 IMPLANT
BLADE SURG 15 STRL SS (BLADE) ×1
CATH BRONCH ASP 14F (CATHETERS) IMPLANT
CATH COUNCIL 22FR (CATHETERS) IMPLANT
CATH FOL 2WAY LX 20X5 (CATHETERS) ×2 IMPLANT
CATH STENT KAYE NEPHR TAMP (CATHETERS) IMPLANT
CATH TRAY 16F METER LATEX (MISCELLANEOUS) ×2 IMPLANT
CATH URETL 5X70 OPEN END (CATHETERS) ×2 IMPLANT
CATH/STENT KAYE NEPHR TAMP (CATHETERS)
CHLORAPREP W/TINT 26ML (MISCELLANEOUS) ×2 IMPLANT
CNTNR SPEC 2.5X3XGRAD LEK (MISCELLANEOUS) ×1
CONRAY 43 FOR UROLOGY 50M (MISCELLANEOUS) ×6 IMPLANT
CONT SPEC 4OZ STER OR WHT (MISCELLANEOUS) ×1
CONTAINER SPEC 2.5X3XGRAD LEK (MISCELLANEOUS) ×1 IMPLANT
DEVICE INFLATION 26 ENCORE (MISCELLANEOUS) ×2 IMPLANT
DRAPE C-ARM XRAY 36X54 (DRAPES) ×2 IMPLANT
DRAPE SHEET LG 3/4 BI-LAMINATE (DRAPES) ×2 IMPLANT
DRAPE SURG 17X11 SM STRL (DRAPES) ×8 IMPLANT
GAUZE SPONGE 4X4 12PLY STRL (GAUZE/BANDAGES/DRESSINGS) ×2 IMPLANT
GLIDEWIRE STIFF .35X180X3 HYDR (WIRE) ×2 IMPLANT
GLOVE BIO SURGEON STRL SZ 6.5 (GLOVE) ×4 IMPLANT
GLOVE BIO SURGEON STRL SZ7 (GLOVE) ×4 IMPLANT
GLOVE BIOGEL PI IND STRL 6.5 (GLOVE) ×2 IMPLANT
GLOVE BIOGEL PI INDICATOR 6.5 (GLOVE) ×2
GOWN STRL REUS W/ TWL LRG LVL3 (GOWN DISPOSABLE) ×2 IMPLANT
GOWN STRL REUS W/ TWL LRG LVL4 (GOWN DISPOSABLE) ×2 IMPLANT
GOWN STRL REUS W/TWL LRG LVL3 (GOWN DISPOSABLE) ×2
GOWN STRL REUS W/TWL LRG LVL4 (GOWN DISPOSABLE) ×2
GUIDEWIRE GREEN .038 145CM (MISCELLANEOUS) ×2 IMPLANT
GUIDEWIRE INTRO SET STRAIGHT (WIRE) ×2 IMPLANT
GUIDEWIRE STR ZIPWIRE 035X150 (MISCELLANEOUS) ×2 IMPLANT
GUIDEWIRE SUPER STIFF .035X180 (WIRE) ×2 IMPLANT
INTRODUCER DILATOR DOUBLE (INTRODUCER) IMPLANT
KIT RM TURNOVER CYSTO AR (KITS) ×2 IMPLANT
MANIFOLD NEPTUNE II (INSTRUMENTS) ×2 IMPLANT
NDL FASCIA INCISION 18GA (NEEDLE) IMPLANT
PACK BASIN MINOR ARMC (MISCELLANEOUS) ×2 IMPLANT
PACK CYSTO AR (MISCELLANEOUS) ×2 IMPLANT
PAD ABD DERMACEA PRESS 5X9 (GAUZE/BANDAGES/DRESSINGS) ×2 IMPLANT
PREP PVP WINGED SPONGE (MISCELLANEOUS) IMPLANT
PROBE CYBERWAND SET (MISCELLANEOUS) ×2 IMPLANT
SENSORWIRE 0.038 NOT ANGLED (WIRE) ×4
SET CYSTO W/LG BORE CLAMP LF (SET/KITS/TRAYS/PACK) ×2 IMPLANT
SET IRRIG Y TYPE TUR BLADDER L (SET/KITS/TRAYS/PACK) ×2 IMPLANT
SET IRRIGATING DISP (SET/KITS/TRAYS/PACK) ×2 IMPLANT
SHEET NEURO XL SOL CTL (MISCELLANEOUS) ×2 IMPLANT
SOL .9 NS 3000ML IRR  AL (IV SOLUTION) ×2
SOL .9 NS 3000ML IRR UROMATIC (IV SOLUTION) ×2 IMPLANT
SPONGE DRAIN TRACH 4X4 STRL 2S (GAUZE/BANDAGES/DRESSINGS) ×2 IMPLANT
STENT URET 6FRX24 CONTOUR (STENTS) IMPLANT
STENT URET 6FRX26 CONTOUR (STENTS) IMPLANT
SURGILUBE 2OZ TUBE FLIPTOP (MISCELLANEOUS) ×2 IMPLANT
SUT ETHILON 3 0 FSLX (SUTURE) ×2 IMPLANT
SUT SILK 0 SH 30 (SUTURE) IMPLANT
SYR 20CC LL (SYRINGE) ×2 IMPLANT
SYR 30ML LL (SYRINGE) ×2 IMPLANT
SYRINGE 10CC LL (SYRINGE) ×2 IMPLANT
SYRINGE IRR TOOMEY STRL 70CC (SYRINGE) ×2 IMPLANT
TAPE MICROFOAM 4IN (TAPE) ×2 IMPLANT
TRAP SPECIMEN MUCOUS 40CC (MISCELLANEOUS) ×2 IMPLANT
TUBING CONNECTING 10 (TUBING) ×4 IMPLANT
WATER STERILE IRR 1000ML POUR (IV SOLUTION) ×2 IMPLANT
WIRE SENSOR 0.038 NOT ANGLED (WIRE) ×2 IMPLANT

## 2015-07-23 NOTE — Transfer of Care (Signed)
Immediate Anesthesia Transfer of Care Note  Patient: Katelyn Holland  Procedure(s) Performed: Procedure(s): NEPHROLITHOTOMY PERCUTANEOUS (Left)  Patient Location: PACU  Anesthesia Type:General  Level of Consciousness: sedated and responds to stimulation  Airway & Oxygen Therapy: Patient Spontanous Breathing and Patient connected to face mask oxygen  Post-op Assessment: Report given to RN and Post -op Vital signs reviewed and stable  Post vital signs: Reviewed and stable  Last Vitals:  Filed Vitals:   07/23/15 0825 07/23/15 1213  BP: 106/68 115/75  Pulse: 92 105  Temp: 35.7 C 36.3 C  Resp: 16 11    Complications: No apparent anesthesia complications

## 2015-07-23 NOTE — Brief Op Note (Signed)
07/23/2015  12:27 PM  PATIENT:  Shana Chuterystal M Obrecht  40 y.o. female  PRE-OPERATIVE DIAGNOSIS:  LEFT NEPHROLITHIASIS  POST-OPERATIVE DIAGNOSIS:  LEFT NEPHROLITHIASIS  PROCEDURE:  Procedure(s): NEPHROLITHOTOMY PERCUTANEOUS (Left)  Left antegrade nephrostogram  SURGEON:  Surgeon(s) and Role:    * Vanna ScotlandAshley Wrenly Lauritsen, MD - Primary  ASSISTANTS: none   ANESTHESIA:   general  EBL:  Total I/O In: 700 [I.V.:700] Out: 350 [Urine:350]  Drains: 16 Fr Foley and 20 Fr left nephrostomy (foley) and 5 Fr nephroureteral stent  Specimen: stone fragment  COUNTS CORRECT: YES  PLAN OF CARE: Admit to inpatient   PATIENT DISPOSITION:  PACU - hemodynamically stable.

## 2015-07-23 NOTE — H&P (View-Only) (Signed)
40 year old white female who presents today in follow-up from the hospital. The patient was hospitalized on 07/08/15 for emphysematous pyelonephritis and pyelitis. 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 She had a nephrostomy tube placed at the time of her hospitalization. Her urine culture grew  >100,000 CFU/hpf Escherichia coli, pansensitive. She was discharged home on 2 weeks of Bactrim. She continues on Bactrim today. The patient has not had any ongoing fevers or chills. Her pain has improved significantly. The urine in her nephrostomy tube is cleared. The patient has a long history of kidney stones. She was treated by Dr. Achilles Dunkope here in [place] as well as Dr. Bryson HaPreminger at [facility]. She has not been seen by urologist in a few years.  At one point, she had undergone proper segment of metabolic stone evaluation and was recommended to start potassium citrate tablets, which she is not taking because of the expense associated with it as well as the size of the pills.  The patient's past history is significant for gastric bypass surgery, gastric sleeve operation. This was performed one year prior. In addition, the patient had an appendectomy as a child, 2 back surgeries, and a hysterectomy.  Current Outpatient Prescriptions on File Prior to Visit  Medication Sig Dispense Refill  . cyclobenzaprine (FLEXERIL) 10 MG tablet Take 1 tablet (10 mg total) by mouth at bedtime. 30 tablet 3  . oxybutynin (DITROPAN) 5 MG tablet Take 1 tablet (5 mg total) by mouth 3 (three) times daily. 90 tablet 0  . sulfamethoxazole-trimethoprim (BACTRIM DS) 800-160 MG tablet Take 1 tablet by mouth 2 (two) times daily. 28 tablet 0  . acyclovir (ZOVIRAX) 400 MG tablet Take 1 tablet (400 mg total) by mouth 2 (two) times daily. (Patient not taking: Reported on 07/21/2015) 20 tablet 5  . ondansetron (ZOFRAN) 4 MG tablet Take 1 tablet (4 mg total) by mouth daily as needed for nausea or  vomiting. (Patient not taking: Reported on 07/21/2015) 20 tablet 0   No current facility-administered medications on file prior to visit.   Past Medical History  Diagnosis Date  . Kidney stone   . Gravida 1 para 1   . Chronic constipation   . Arthritis    Past Surgical History  Procedure Laterality Date  . Back surgery    . Abdominal hysterectomy    . Laparoscopic gastric sleeve resection  2016  . Lithotripsy  2007  . Appendectomy     PE: Filed Vitals:   07/21/15 1325  BP: 128/78  Pulse: 114   NAD Normal breath sounds, posteriorly both sides. Regular rate and rhythm, no murmurs appreciated Left-sided nephrostomy tube is dressed and secured cleanly to the patient's skin. The area around it is nontender. I did not take the dressing down to evaluate the site. The urine draining is clear yellow urine. The abdomen is soft, nontender, nondistended. Extremities are symmetric, no appreciable edema  Urinalysis today demonstrates no clear evidence of infection. A urine culture will be sent from the nephrostomy tube. I have independently reviewed the patient's CT scan which demonstrates left-sided hydronephrosis, emphysematous pyelitis, and a large stone burden in the lower pole. Images obtained during the nephrostomy tube placement suggest a lower pole access.  Impression: The patient has a large left lower pole stone burden, appears to have improved from her pansensitive Escherichia coli emphysematous pyelitis. Hydronephrosis is either from an obstructing stone or potentially a left UPJ obstruction. Her nephrostomy tube is clearly functioning. Her  infection appears to be well treated, the patient continues on Bactrim double strength.  Discussion: I discussed with the patient and her husband the next step in the treatment algorithim for her circumstance. Specifically, I recommended the patient undergo percutaneous left nephrolithotomy. I explained the surgery to the patient and her husband  in quite some detail. She understands that she will be prone during the operation and that the small nephrostomy tube will be exchanged for a larger tube during the surgery. She also understands the risks of bleeding, infection, and injury to his the surrounding structures including her spleen and colon. Further, she understands that she will likely have a nephrostomy tube and a stent following the operation. I explained to her the expected hospital course would be removal of the nephrostomy tube on postop day #1 after she has a CT scan to evaluate for residual stone fragments. She also understands that she may require a second operation, likely ureteroscopy so that she can be rendered stone free. I have continued the patient on Bactrim which she will take up to the day of surgery. I also sent a urine culture today. I also gave the patient a prescription for tramadol for her pain and discomfort. I written the patient to be out of work for 1 month following the operation given the physical nature of her job.

## 2015-07-23 NOTE — Anesthesia Postprocedure Evaluation (Signed)
Anesthesia Post Note  Patient: Katelyn Holland  Procedure(s) Performed: Procedure(s) (LRB): NEPHROLITHOTOMY PERCUTANEOUS (Left)  Patient location during evaluation: PACU Anesthesia Type: General Level of consciousness: awake and alert Pain management: pain level controlled Vital Signs Assessment: post-procedure vital signs reviewed and stable Respiratory status: spontaneous breathing and respiratory function stable Cardiovascular status: stable Anesthetic complications: no    Last Vitals:  Filed Vitals:   07/23/15 0825 07/23/15 1213  BP: 106/68 115/75  Pulse: 92 105  Temp: 35.7 C 36.3 C  Resp: 16 11    Last Pain:  Filed Vitals:   07/23/15 1217  PainSc: Asleep                 Tarrence Enck K

## 2015-07-23 NOTE — Anesthesia Preprocedure Evaluation (Addendum)
Anesthesia Evaluation  Patient identified by MRN, date of birth, ID band Patient awake    Reviewed: Allergy & Precautions, NPO status , Patient's Chart, lab work & pertinent test results  History of Anesthesia Complications Negative for: history of anesthetic complications  Airway Mallampati: II       Dental  (+) Teeth Intact   Pulmonary neg pulmonary ROS,           Cardiovascular negative cardio ROS       Neuro/Psych negative neurological ROS     GI/Hepatic negative GI ROS, Neg liver ROS,   Endo/Other  negative endocrine ROS  Renal/GU Renal disease (stones)     Musculoskeletal  (+) Arthritis ,   Abdominal   Peds  Hematology  (+) anemia ,   Anesthesia Other Findings   Reproductive/Obstetrics                             Anesthesia Physical Anesthesia Plan  ASA: II  Anesthesia Plan: General   Post-op Pain Management:    Induction: Intravenous  Airway Management Planned: Oral ETT  Additional Equipment:   Intra-op Plan:   Post-operative Plan:   Informed Consent: I have reviewed the patients History and Physical, chart, labs and discussed the procedure including the risks, benefits and alternatives for the proposed anesthesia with the patient or authorized representative who has indicated his/her understanding and acceptance.     Plan Discussed with:   Anesthesia Plan Comments:        Anesthesia Quick Evaluation

## 2015-07-23 NOTE — Op Note (Signed)
Date of procedure: 07/23/2015  Preoperative diagnosis:  1. Left nephrolithiasis  2. Probable left UPJ obstruction  Postoperative diagnosis:  1. Same as above   Procedure: 1. Left percutaneous nephrolithotomy 2. Left antegrade nephrostogram 3. Interpretation of fluoroscopy less than 30 minutes  Surgeon: Vanna Scotland, MD  Anesthesia: General  Complications: None  Intraoperative findings: Narrowing of the UPJ noted, unable to pass 16 French cystoscope beyond narrowing but contrast freely flowing down decompressed ureter. Lower pole stone burden treated effectively.  EBL: 100 cc  Specimens: Stone fragment  Drains: 16 French Foley catheter, 20 French left nephrostomy tube (Foley), 5 French nephroureteral catheter  Indication: Katelyn Holland is a 40 y.o. 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  After reviewing the management options for treatment, she elected to proceed with the above surgical procedure(s). We have discussed the potential benefits and risks of the procedure, side effects of the proposed treatment, the likelihood of the patient achieving the goals of the procedure, and any potential problems that might occur during the procedure or recuperation. Informed consent has been obtained.  Description of procedure:  The patient was taken to the operating room and general anesthesia was induced.  A Foley catheter is placed in the standard sterile technique and the patient was then repositioned in the prone position on the operating room table. Pressure points were all carefully padded and she was strapped to the table. Preoperative antibiotics in the form of extended dose gentamicin and ampicillin were administered.  She was then prepped and draped in standard surgical fashion.  A preoperative time-out was performed.   At this point in time, contrast was injected through the existing lower pole  nephrostomy tube for the purpose of evaluating the upper tract anatomy and nephrostomy tube location.  The collecting system was noted to be chronically dilated with caliectasis. The contrasted pass through the UPJ which was subtly narrowed and flow freely down the length of the ureter into the bladder. The nephrostomy tube was coiled within a posterior lower pole calyx. A sensor wire was then placed through the nephrostomy tube and coiled into the lower pole calyx and the nephrostomy tube was removed. I then attempted using multiple techniques and various wires to gain access across the UPJ but ultimately was unsuccessful. I did coil a Super Stiff wire along with a sensor wire in the lower pole calyx and confirmed the wire locations by performing an antegrade through a Kumpe catheter.  At this point in time, a fascial incisor needle was used over the working Super Stiff wire and an incision proximally 1.5 cm in length was created on the skin.  A balloon dilator was then advanced over the Super Stiff wire into the lower pole calyx and filled up to 18 cm of water. This was allowed to remain in place for hemostasis for several minutes. The access sheath was then advanced over the balloon into the lower pole calyx. The nephroscope was brought in which revealed normal urothelium and the stones in the lower pole were readily apparent. Surveillance of the lower pole revealed a narrow opening into an infundibulum through which a wire was able to be advanced into the renal pelvis, across the UPJ, and down into the bladder which was confirmed fluoroscopically.  At this point in time, each of the stones were plucked from the lower pole, a total of a proximally 10 larger stones. A few of the stones were obliterated and suctioned  using the lithoclast probe. Once the lower pole was completely clear of stone, a flexible cystoscope was used to completely surveying the entire kidney including the upper midpole calyces. At this  point time, there were no residual stone fragments noted. Fluoroscopically, no residual stone burden was appreciated. A 20 French Foley catheter was then advanced through the sheath into the lower pole calyx and the balloon was inflated with 1.5 cc of diluted contrast. Final antegrade nephrostogram was performed to ensure its location within the collecting system which was confirmed. A 5 French open-ended nephroureteral catheter was advanced over the wire down to the level of the mid ureter as a safety access. The sheath was then backed out of the tract and the nephrostomy and nephroureteral stent were secured using nylons 2. The patient was then cleaned and dried. A dressing of 4 x 4's, ABD, and foam tape were used.  The patient was then moved back in the supine position on the stretcher, reversed from anesthesia, and extubated. She was taken to the PACU in stable condition. Hemostasis was excellent throughout the case.  Plan: Patient will be admitted overnight and likely discharged tomorrow without any tubes.  She will need a Lasix renogram study to assess for chronic obstruction of the left UPJ. We will also need to assess her overall renal function on that side given the presence of cortical atrophy.  Vanna ScotlandAshley Monifah Freehling, M.D.

## 2015-07-23 NOTE — Interval H&P Note (Signed)
History and Physical Interval Note:  07/23/2015 8:20 AM  Katelyn Holland  has presented today for surgery, with the diagnosis of LEFT NEPHROLITHIASIS  The various methods of treatment have been discussed with the patient and family. After consideration of risks, benefits and other options for treatment, the patient has consented to  Procedure(s): NEPHROLITHOTOMY PERCUTANEOUS (Left) CYSTOSCOPY WITH STENT PLACEMENT (Left) HOLMIUM LASER APPLICATION (Left) as a surgical intervention .  The patient's history has been reviewed, patient examined, no change in status, stable for surgery.  I have reviewed the patient's chart and labs.  Questions were answered to the patient's satisfaction.     Vanna ScotlandAshley Damen Windsor

## 2015-07-23 NOTE — Anesthesia Procedure Notes (Signed)
Procedure Name: Intubation Date/Time: 07/23/2015 9:46 AM Performed by: Omer JackWEATHERLY, Elva Breaker Pre-anesthesia Checklist: Patient identified, Patient being monitored, Timeout performed, Emergency Drugs available and Suction available Patient Re-evaluated:Patient Re-evaluated prior to inductionOxygen Delivery Method: Circle system utilized Preoxygenation: Pre-oxygenation with 100% oxygen Intubation Type: IV induction Ventilation: Mask ventilation without difficulty Laryngoscope Size: Mac and 3 Grade View: Grade I Tube type: Oral Tube size: 7.0 mm Number of attempts: 1 Placement Confirmation: ETT inserted through vocal cords under direct vision,  positive ETCO2 and breath sounds checked- equal and bilateral Secured at: 21 cm Tube secured with: Tape Dental Injury: Teeth and Oropharynx as per pre-operative assessment

## 2015-07-24 LAB — BASIC METABOLIC PANEL
Anion gap: 9 (ref 5–15)
BUN: 12 mg/dL (ref 6–20)
CHLORIDE: 101 mmol/L (ref 101–111)
CO2: 26 mmol/L (ref 22–32)
CREATININE: 0.63 mg/dL (ref 0.44–1.00)
Calcium: 8.8 mg/dL — ABNORMAL LOW (ref 8.9–10.3)
GFR calc non Af Amer: >60 mL/min (ref >60)
Glucose, Bld: 113 mg/dL — ABNORMAL HIGH (ref 65–99)
POTASSIUM: 4.1 mmol/L (ref 3.5–5.1)
Sodium: 136 mmol/L (ref 135–145)

## 2015-07-24 LAB — HEMOGLOBIN AND HEMATOCRIT, BLOOD
HEMATOCRIT: 36.3 % (ref 35.0–47.0)
Hemoglobin: 12.4 g/dL (ref 12.0–16.0)

## 2015-07-24 MED ORDER — DOCUSATE SODIUM 100 MG PO CAPS: 100.0000 mg | ORAL_CAPSULE | Freq: Two times a day (BID) | ORAL | Status: DC

## 2015-07-24 MED ORDER — OXYCODONE-ACETAMINOPHEN 5-325 MG PO TABS: 1.0000 | ORAL_TABLET | ORAL | Status: DC | PRN

## 2015-07-24 NOTE — Discharge Instructions (Signed)
Percutaneous Nephrolithotomy °Kidney stones can cause a great deal of pain. They can block urine from leaving the body. And they can lead to infection. Kidney stones might pass on their own, or they can be broken up by shock waves from a special machine. But sometimes surgery is needed to get rid of kidney stones. One type of surgery is called percutaneous nephrolithotomy. "Nephro" means kidney, "litho" means stone and "tomy" means removal by surgery. "Percutaneous" means through the skin. This type of surgery needs only a small cut (incision) in the skin. °This surgery may be suggested for various reasons. They include: °· The kidney stones are 2 centimeters wide (about 3/4 inch) or bigger. They also might be oddly shaped. °· Other treatments were tried, but all or some of the kidney stones remain. °· Infection has developed. °· No other treatment can be used. °LET YOUR CAREGIVER KNOW ABOUT:  °· Any allergies. °· All medications you are taking, including: °¨ Herbs, eyedrops, over-the-counter medications and creams. °¨ Blood thinners (anticoagulants), aspirin or other drugs that could affect blood clotting. °· Use of steroids (by mouth or as creams). °· Previous problems with anesthetics, including local anesthetics. °· Possibility of pregnancy, if this applies. °· Any history of blood clots. °· Any history of bleeding or other blood problems. °· Previous surgery. °· Smoking history. °· Other health problems. °RISKS AND COMPLICATIONS  °· During surgery: °¨ Sometimes all the kidney stones cannot be removed through the tube. Then, the percutaneous nephrolithotomy procedure would be stopped. Open surgery would be used to remove the remaining stones. °· Short-term risks from the surgery could include: °¨ Excessive bleeding. °¨ Blood in your urine. °¨ Holes in the kidney. These usually heal on their own. °¨ Pain. °¨ Redness or tenderness at the incision site. °¨ Numbness (loss of feeling) in the area treated. Tingling  also is possible. °¨ A pooling of blood in the wound (hematoma). °¨ Infection. °¨ Slow healing. °· Longer-term possibilities include: °¨ Kidney damage. °¨ Damage to organs near the kidney. °¨ Need for a repeat surgery. °BEFORE THE PROCEDURE °· You may need to take some tests before your surgery. These might include: °¨ Blood tests. °¨ Urine tests. °¨ Tests to make sure your heart is working properly. °· Let your healthcare provider know if you think you might have a urinary tract infection. You will probably need to take an antibiotic to treat this before the surgery. °· Two weeks before your surgery, stop using aspirin and non-steroidal anti-inflammatory drugs (NSAIDs) for pain relief. This includes prescription drugs and over-the-counter drugs such as ibuprofen and naproxen. Also stop taking vitamin E. °· If you take blood-thinners, ask your healthcare provider when you should stop taking them. °· Do not eat or drink for about 8 hours before your surgery. °· You might be asked to shower or wash with a special antibacterial soap before the procedure. °· Arrive at least an hour before the surgery, or whenever your surgeon recommends. This will give you time to check in and fill out any needed paperwork. °PROCEDURE °· The preparation: °¨ You will change into a hospital gown. °¨ You will be given an IV. A needle will be inserted in your arm. Medication will be able to flow directly into your body through this needle. °¨ You might be given a sedative. This medication will help you relax. °¨ You may be given a general anesthetic (a drug that will put you to sleep during the surgery). Or, you may   get a local anesthetic (part of your body will be numb, but you will remain awake). °¨ A catheter (tube) will be put in your bladder to drain urine during and after surgery. °· The procedure: °¨ The surgeon will make a small incision in your lower back. °¨ A tube will be inserted through the incision into your kidney. °¨ Each  kidney stone is removed through this tube. Larger stones may first be broken up with a laser (a high-intensity light beam) or other tools. °¨ If a kidney stone has already left the kidney, the surgeon would use a special tool to bring it back in. Then it would be removed through the tube. °¨ After all the stones are taken out, a catheter will be put in. Fluid can build up around the kidney as it heals. The catheter lets this fluid drain out of the body. °¨ A dressing (medicine and bandage) will be put on the incision area. °¨ The surgery usually takes three to four hours. °AFTER THE PROCEDURE °· You will stay in a recovery area until the anesthesia has worn off. Your blood pressure and pulse will be checked often. You might be given more pain medication. °· You will be moved to a hospital room for the rest of your stay. °· The catheter that is taking urine out of your body will be taken out within 24 hours. °· The day after your surgery, you should be able to walk around. Walking helps prevent blood clots (thick clumps that can block the flow of blood). °· You may be asked to do some breathing exercises. °· You will be able to have only liquids for a day or two. °· Before you go home: °¨ The catheter that is draining fluid from the kidney area is usually taken out. °¨ You will be taught how to care for the incision. Be sure to ask how often the dressing should be changed and when it can get wet. °¨ You also will be told what you should and should not do while your kidney and incisions heal. For instance, you may be urged to walk to prevent blood clots. °  °This information is not intended to replace advice given to you by your health care provider. Make sure you discuss any questions you have with your health care provider. °  °Document Released: 04/24/2009 Document Revised: 07/18/2014 Document Reviewed: 12/22/2014 °Elsevier Interactive Patient Education ©2016 Elsevier Inc. ° °

## 2015-07-24 NOTE — Progress Notes (Signed)
MD ordered patient to be discharged. Discharge instructions were reviewed with the patient and husband and they voiced understanding.  Prescriptions were given.  Follow-up appointment was made.  IV was removed with catheter intact.  Patient was sent home with some supplies to change the dressing if needed.  All questions were answered.  Patient left via wheelchair escorted by auxillary.

## 2015-07-24 NOTE — Progress Notes (Signed)
1 Day Post-Op Subjective:  Bladder spasms overnight after ambulating, catheter draining well.  Tolerating regular diet.  No fevers.  No other complaints.  Anxious about nephrostomy tube removal.    Objective: Vital signs in last 24 hours: Temp:  [97.3 F (36.3 C)-98 F (36.7 C)] 98 F (36.7 C) (01/13 0627) Pulse Rate:  [77-122] 107 (01/13 0627) Resp:  [8-20] 16 (01/13 0627) BP: (106-116)/(62-75) 112/68 mmHg (01/13 0627) SpO2:  [95 %-100 %] 100 % (01/13 0627)  Intake/Output from previous day: 01/12 0701 - 01/13 0700 In: 3638 [P.O.:480; I.V.:3112; IV Piggyback:46] Out: 1480 [Urine:1480] Intake/Output this shift: Total I/O In: -  Out: 75 [Urine:75]  Physical Exam:  General: Alert and oriented. CV: No c/d/e.  Lungs: No increased WOB. GI: Soft, Nondistended. Incisions: L nephrostomy in place, capped. Urine: Clear, Foley in place draining dark brownish colored urine, no clots.   Extremities: Nontender, no erythema, no edema.  Lab Results:  Recent Labs  07/22/15 1141 07/24/15 0521  HGB 12.3 12.4  HCT 36.5 36.3          Recent Labs  07/22/15 1141 07/24/15 0521  CREATININE 0.75 0.63           Results for orders placed or performed during the hospital encounter of 07/23/15 (from the past 24 hour(s))  Basic metabolic panel     Status: Abnormal   Collection Time: 07/24/15  5:21 AM  Result Value Ref Range   Sodium 136 135 - 145 mmol/L   Potassium 4.1 3.5 - 5.1 mmol/L   Chloride 101 101 - 111 mmol/L   CO2 26 22 - 32 mmol/L   Glucose, Bld 113 (H) 65 - 99 mg/dL   BUN 12 6 - 20 mg/dL   Creatinine, Ser 1.610.63 0.44 - 1.00 mg/dL   Calcium 8.8 (L) 8.9 - 10.3 mg/dL   GFR calc non Af Amer >60 >60 mL/min   GFR calc Af Amer >60 >60 mL/min   Anion gap 9 5 - 15  Hemoglobin and hematocrit, blood     Status: None   Collection Time: 07/24/15  5:21 AM  Result Value Ref Range   Hemoglobin 12.4 12.0 - 16.0 g/dL   HCT 09.636.3 04.535.0 - 40.947.0 %    Assessment/Plan: POD# 1 s/p L  PNCL.  1) Ambulate, Incentive spirometry 2) Advance diet as tolerated 3) PO pain meds only 4) D/C Foley 5) d/c capped nephrostomy tube later today 6) likely d/c home this afternoon if doing well.   Vanna ScotlandAshley Reneta Niehaus, MD   LOS: 1 day   Vanna ScotlandAshley Braylynn Lewing 07/24/2015, 8:42 AM

## 2015-07-24 NOTE — Discharge Summary (Signed)
Date of admission: 07/23/2015  Date of discharge: 07/24/2015  Admission diagnosis: Left nephrolithiasis, left UPJ obstruction  Discharge diagnosis: Left nephrolithiasis, left UPJ obstruction  Secondary diagnoses:  Patient Active Problem List   Diagnosis Date Noted  . Hydronephrosis 07/09/2015  . 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 Hypokalemia 07/09/2015  . Anemia 07/09/2015  . 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 Kidney stones 07/07/2015  . Chronic constipation 07/07/2015  . Chronic back pain 12/24/2014  . Family history of heart disease in female family member before age 66 12/24/2014  . Low libido 12/24/2014  . Morbid obesity ([place]) 04/10/2014    History and Physical: For full details, please see admission history and physical. Briefly, Katelyn Holland is a 40 y.o. year old patient with left lower pole stones and a chronically obstructed left kidney. She was counseled to undergo left percutaneous nephrolithotomy for treatment of her stones. See H&P for details.Marland Kitchen   Hospital Course: Patient tolerated the procedure well.  She was then transferred to the floor after an uneventful PACU stay.    Her hospital course was uncomplicated.  Her labs and vitals remained stable, afebrile.   Her Foley catheter was removed and her nephrostomy tube was capped. She experienced no increased left flank pain with capping of her nephrostomy tube which was ultimately able to be removed on postoperative day 1.   On POD#*1 she had met discharge criteria: was eating a regular diet, was up and ambulating independently,  pain was well controlled, was voiding without a catheter, and was ready to for discharge.   Laboratory values:   Recent Labs  07/22/15 1141 07/24/15 0521  WBC 5.2  --   HGB 12.3 12.4  HCT 36.5 36.3    Recent Labs  07/22/15 1141 07/24/15 0521  NA 138 136  K 3.5 4.1  CL 103 101  CO2 28 26  GLUCOSE 99 113*  BUN 12 12  CREATININE 0.75 0.63  CALCIUM 8.9  8.8*   No results for input(s): LABPT, INR in the last 72 hours.  Recent Labs  07/21/15 1431  LABURIN Final report   Results for orders placed or performed in visit on 07/21/15  Microscopic Examination     Status: Abnormal   Collection Time: 07/21/15  1:20 PM  Result Value Ref Range Status   WBC, UA 0-5 0 -  5 /hpf Final   RBC, UA 3-10 (A) 0 -  2 /hpf Final   Epithelial Cells (non renal) 0-10 0 - 10 /hpf Final   Mucus, UA Present (A) Not Estab. Final   Bacteria, UA Moderate (A) None seen/Few Final  CULTURE, URINE COMPREHENSIVE     Status: None   Collection Time: 07/21/15  2:31 PM  Result Value Ref Range Status   Urine Culture, Comprehensive Final report  Final   Result 1 Comment  Final    Comment: No growth in 36 - 48 hours.    Disposition: Home  Discharge instruction: The patient was instructed to be ambulatory but told to refrain from heavy lifting, strenuous activity, or driving while taking narcotics.  She may return to work next week as tolerated.  Discharge medications:    Medication List    TAKE these medications        acyclovir 400 MG tablet  Commonly known as:  ZOVIRAX  Take 1 tablet (400 mg total) by mouth 2 (two) times daily.     cyclobenzaprine 10 MG tablet  Commonly known as:  FLEXERIL  Take 1  tablet (10 mg total) by mouth at bedtime.     docusate sodium 100 MG capsule  Commonly known as:  COLACE  Take 1 capsule (100 mg total) by mouth 2 (two) times daily.     oxybutynin 5 MG tablet  Commonly known as:  DITROPAN  Take 1 tablet (5 mg total) by mouth 3 (three) times daily.     oxyCODONE-acetaminophen 5-325 MG tablet  Commonly known as:  PERCOCET  Take 1-2 tablets by mouth every 4 (four) hours as needed for moderate pain or severe pain.     sulfamethoxazole-trimethoprim 800-160 MG tablet  Commonly known as:  BACTRIM DS  Take 1 tablet by mouth 2 (two) times daily.     traMADol 50 MG tablet  Commonly known as:  ULTRAM  Take 1-2 tablets (50-100 mg  total) by mouth every 6 (six) hours as needed for moderate pain.        Followup:  Follow-up Information    Follow up with Hollice Espy, MD In 4 weeks.   Specialty:  Urology   Why:  with KUB prior   Contact information:   [address] [place] [address] [phone]

## 2015-07-28 ENCOUNTER — Ambulatory Visit (INDEPENDENT_AMBULATORY_CARE_PROVIDER_SITE_OTHER): Payer: BLUE CROSS/BLUE SHIELD | Admitting: Urology

## 2015-07-28 ENCOUNTER — Encounter: Payer: Self-pay | Admitting: Urology

## 2015-07-28 VITALS — BP 138/84 | HR 109 | Ht 63.0 in | Wt 154.0 lb

## 2015-07-28 DIAGNOSIS — N3289 Other specified disorders of bladder: Secondary | ICD-10-CM

## 2015-07-28 DIAGNOSIS — N200 Calculus of kidney: Secondary | ICD-10-CM

## 2015-07-28 DIAGNOSIS — R310 Gross hematuria: Secondary | ICD-10-CM

## 2015-07-28 MED ORDER — OXYBUTYNIN CHLORIDE 5 MG PO TABS: 5.0000 mg | ORAL_TABLET | Freq: Three times a day (TID) | ORAL | Status: DC

## 2015-07-28 MED ORDER — SULFAMETHOXAZOLE-TRIMETHOPRIM 800-160 MG PO TABS: 1.0000 | ORAL_TABLET | Freq: Two times a day (BID) | ORAL | Status: AC

## 2015-07-28 NOTE — Progress Notes (Signed)
07/28/2015 5:11 PM   Katelyn Holland [birth year] [id number]  Referring provider: Loura Pardon, NP [address] [place], [place] [address]  Chief Complaint  Patient presents with  . Post-op Problem    possible blood clot in bladder    HPI:  40 year old female status post left PCNL  On 07/23/2015. She returns today to the office after experiencing severe bladder spasms and intermittent hematuria. Upon discharge from the hospital, she did have  Some persistent hematuria but was able to void independently after Foley catheter removal and her labs remained stable.   No fevers, chills, nausea, or vomiting.   Her left nephrostomy site is now closed and no longer draining.   Denies left flank pain other than with voiding.   PMH: Past Medical History  Diagnosis Date  . Kidney stone   . Gravida 1 para 1   . Chronic constipation   . Arthritis     Surgical History: Past Surgical History  Procedure Laterality Date  . Abdominal hysterectomy    . Laparoscopic gastric sleeve resection  2016  . Lithotripsy  2007  . Appendectomy    . Back surgery      x 2  . Kidney stone surgery Left 2000    surgery through back  . Nephrolithotomy Left 07/23/2015    Procedure: NEPHROLITHOTOMY PERCUTANEOUS;  Surgeon: Vanna Scotland, MD;  Location: [facility] ORS;  Service: Urology;  Laterality: Left;    Home Medications:    Medication List       This list is accurate as of: 07/28/15  5:11 PM.  Always use your most recent med list.               acyclovir 400 MG tablet  Commonly known as:  ZOVIRAX  Take 1 tablet (400 mg total) by mouth 2 (two) times daily.     cyclobenzaprine 10 MG tablet  Commonly known as:  FLEXERIL  Take 1 tablet (10 mg total) by mouth at bedtime.     docusate sodium 100 MG capsule  Commonly known as:  COLACE  Take 1 capsule (100 mg total) by mouth 2 (two) times daily.     oxybutynin 5 MG tablet  Commonly known as:  DITROPAN  Take 1 tablet (5 mg total) by mouth 3  (three) times daily.     oxyCODONE-acetaminophen 5-325 MG tablet  Commonly known as:  PERCOCET  Take 1-2 tablets by mouth every 4 (four) hours as needed for moderate pain or severe pain.     sulfamethoxazole-trimethoprim 800-160 MG tablet  Commonly known as:  BACTRIM DS  Take 1 tablet by mouth 2 (two) times daily.     sulfamethoxazole-trimethoprim 800-160 MG tablet  Commonly known as:  BACTRIM DS,SEPTRA DS  Take 1 tablet by mouth 2 (two) times daily.     traMADol 50 MG tablet  Commonly known as:  ULTRAM  Take 1-2 tablets (50-100 mg total) by mouth every 6 (six) hours as needed for moderate pain.        Allergies:  Allergies  Allergen Reactions  . Iodinated Diagnostic Agents Nausea And Vomiting    Topical betadine is ok to use.    Family History: Family History  Problem Relation Age of Onset  . Heart disease Mother 42  . Cancer Maternal Grandmother   . Kidney cancer Neg Hx   . Bladder Cancer Neg Hx   . Prostate cancer Neg Hx     Social History:  reports that she has never  smoked. She has never used smokeless tobacco. She reports that she drinks alcohol. She reports that she does not use illicit drugs.  ROS: UROLOGY Frequent Urination?: No Hard to postpone urination?: No Burning/pain with urination?: No Get up at night to urinate?: No Leakage of urine?: No Urine stream starts and stops?: No Trouble starting stream?: No Do you have to strain to urinate?: Yes Blood in urine?: Yes Urinary tract infection?: No Sexually transmitted disease?: No Injury to kidneys or bladder?: No Painful intercourse?: No Weak stream?: No Currently pregnant?: No Vaginal bleeding?: No Last menstrual period?: n  Gastrointestinal Nausea?: No Vomiting?: No Indigestion/heartburn?: No Diarrhea?: No Constipation?: No  Constitutional Fever: No Night sweats?: No Weight loss?: No Fatigue?: No  Skin Skin rash/lesions?: No Itching?: No  Eyes Blurred vision?: No Double vision?:  No  Ears/Nose/Throat Sore throat?: No Sinus problems?: No  Hematologic/Lymphatic Swollen glands?: No Easy bruising?: No  Cardiovascular Leg swelling?: No Chest pain?: No  Respiratory Cough?: No Shortness of breath?: No  Endocrine Excessive thirst?: No  Musculoskeletal Back pain?: No Joint pain?: No  Neurological Headaches?: No Dizziness?: No  Psychologic Depression?: No Anxiety?: No  Physical Exam: BP 138/84 mmHg  Pulse 109  Ht  (1.6 m)  Wt 154 lb (69.854 kg)  BMI 27.29 kg/m2  Constitutional:  Alert and oriented, No acute distress. HEENT: [place] AT, moist mucus membranes.  Trachea midline, no masses. Cardiovascular: No clubbing, cyanosis, or edema. Respiratory: Normal respiratory effort, no increased work of breathing. GI: Abdomen is soft, nontender, nondistended, no abdominal masses.   Left nephrostomy tube site now closed, no surrounding erythema, no drainage. GU: No CVA tenderness.  Normal external genitalia and urethral meatus. Skin: No rashes, bruises or suspicious lesions. Neurologic: Grossly intact, no focal deficits, moving all 4 extremities. Psychiatric: Normal mood and affect.  Laboratory Data: Lab Results  Component Value Date   WBC 5.2 07/22/2015   HGB 12.4 07/24/2015   HCT 36.3 07/24/2015   MCV 82.8 07/22/2015   PLT 258 07/22/2015    Lab Results  Component Value Date   CREATININE 0.63 07/24/2015    Pertinent Imaging: N/a  Procedure:  patient was prepped in the standard sterile fashion. A 22 French to a Foley catheter was advanced per urethra into the bladder. The balloon was filled with 10 cc of sterile water. At this point in time, a 60 cc catheter tip syringe was used to irrigate her bladder. The withdrawn fluid was clear without evidence of clots. Her bladder was irrigated several times without any evidence of debris or clots.  Assessment & Plan:   1. Nephrolithiasis S/p uncomplicated PCNL last week Plan for KUB in 4 weeks  with discussion of lasix renogram to r/o UPJP  2. Bladder spasm No clots, pain in bladder most likely related to spasm. Will treat emperially for UTI as well.  - sulfamethoxazole-trimethoprim (BACTRIM DS,SEPTRA DS) 800-160 MG tablet; Take 1 tablet by mouth 2 (two) times daily.  Dispense: 6 tablet; Refill: 0 - oxybutynin (DITROPAN) 5 MG tablet; Take 1 tablet (5 mg total) by mouth 3 (three) times daily.  Dispense: 30 tablet; Refill: 0  3. Gross hematuria Resolved, anticipated post op.  F/u as scheduled at 4 weeks  Post op OR sooner as needed  Vanna Scotland, MD  [facility] [address] [place], [place] [address] [phone]

## 2015-07-29 ENCOUNTER — Ambulatory Visit: Payer: BLUE CROSS/BLUE SHIELD | Admitting: Urology

## 2015-07-30 LAB — STONE ANALYSIS
AMMONIUM ACID URATE: 10 %
Ca Oxalate,Monohydr.: 75 %
Ca phos cry stone ql IR: 15 %
Stone Weight KSTONE: 757 mg

## 2015-07-31 ENCOUNTER — Encounter: Payer: Self-pay | Admitting: Urology

## 2015-08-21 ENCOUNTER — Encounter: Payer: Self-pay | Admitting: Urology

## 2015-08-21 ENCOUNTER — Ambulatory Visit
Admission: RE | Admit: 2015-08-21 | Discharge: 2015-08-21 | Disposition: A | Discharge status: Home / Self Care | Payer: BLUE CROSS/BLUE SHIELD | Source: Ambulatory Visit | Attending: Urology | Admitting: Urology

## 2015-08-21 ENCOUNTER — Ambulatory Visit: Payer: BLUE CROSS/BLUE SHIELD | Admitting: Urology

## 2015-08-21 VITALS — BP 116/76 | HR 87 | Ht 64.5 in | Wt 157.3 lb

## 2015-08-21 DIAGNOSIS — N200 Calculus of kidney: Secondary | ICD-10-CM

## 2015-08-21 DIAGNOSIS — Z09 Encounter for follow-up examination after completed treatment for conditions other than malignant neoplasm: Secondary | ICD-10-CM | POA: Diagnosis present

## 2015-08-21 DIAGNOSIS — Z419 Encounter for procedure for purposes other than remedying health state, unspecified: Secondary | ICD-10-CM

## 2015-08-21 DIAGNOSIS — R109 Unspecified abdominal pain: Secondary | ICD-10-CM

## 2015-08-21 LAB — MICROSCOPIC EXAMINATION
BACTERIA UA: NONE SEEN
Epithelial Cells (non renal): >10 /hpf — AB (ref 0–10)
RBC MICROSCOPIC, UA: NONE SEEN /HPF (ref 0–?)

## 2015-08-21 LAB — URINALYSIS, COMPLETE
Bilirubin, UA: NEGATIVE
Glucose, UA: NEGATIVE
Ketones, UA: NEGATIVE
Leukocytes, UA: NEGATIVE
NITRITE UA: NEGATIVE
PH UA: 8.5 — AB (ref 5.0–7.5)
RBC, UA: NEGATIVE
Specific Gravity, UA: 1.02 (ref 1.005–1.030)
UUROB: 1 mg/dL (ref 0.2–1.0)

## 2015-08-21 NOTE — Patient Instructions (Signed)
Dietary Guidelines to Help Prevent Kidney Stones Your risk of kidney stones can be decreased by adjusting the foods you eat. The most important thing you can do is drink enough fluid. You should drink enough fluid to keep your urine clear or pale yellow. The following guidelines provide specific information for the type of kidney stone you have had. GUIDELINES ACCORDING TO TYPE OF KIDNEY STONE Calcium Oxalate Kidney Stones  Reduce the amount of salt you eat. Foods that have a lot of salt cause your body to release excess calcium into your urine. The excess calcium can combine with a substance called oxalate to form kidney stones.  Reduce the amount of animal protein you eat if the amount you eat is excessive. Animal protein causes your body to release excess calcium into your urine. Ask your dietitian how much protein from animal sources you should be eating.  Avoid foods that are high in oxalates. If you take vitamins, they should have less than 500 mg of vitamin C. Your body turns vitamin C into oxalates. You do not need to avoid fruits and vegetables high in vitamin C. Calcium Phosphate Kidney Stones  Reduce the amount of salt you eat to help prevent the release of excess calcium into your urine.  Reduce the amount of animal protein you eat if the amount you eat is excessive. Animal protein causes your body to release excess calcium into your urine. Ask your dietitian how much protein from animal sources you should be eating.  Get enough calcium from food or take a calcium supplement (ask your dietitian for recommendations). Food sources of calcium that do not increase your risk of kidney stones include:  Broccoli.  Dairy products, such as cheese and yogurt.  Pudding. Uric Acid Kidney Stones  Do not have more than 6 oz of animal protein per day. FOOD SOURCES Animal Protein Sources  Meat (all types).  Poultry.  Eggs.  Fish, seafood. Foods High in Salt  Salt seasonings.  Soy  sauce.  Teriyaki sauce.  Cured and processed meats.  Salted crackers and snack foods.  Fast food.  Canned soups and most canned foods. Foods High in Oxalates  Grains:  Amaranth.  Barley.  Grits.  Wheat germ.  Bran.  Buckwheat flour.  All bran cereals.  Pretzels.  Whole wheat bread.  Vegetables:  Beans (wax).  Beets and beet greens.  Collard greens.  Eggplant.  [place].  Leeks.  Okra.  Parsley.  Rutabagas.  Spinach.  Swiss chard.  Tomato paste.  Fried potatoes.  Sweet potatoes.  Fruits:  Red currants.  Figs.  Kiwi.  Rhubarb.  Meat and Other Protein Sources:  Beans (dried).  Soy burgers and other soybean products.  Miso.  Nuts (peanuts, almonds, pecans, cashews, hazelnuts).  Nut butters.  Sesame seeds and tahini (paste made of sesame seeds).  Poppy seeds.  Beverages:  Chocolate drink mixes.  Soy milk.  Instant iced tea.  Juices made from high-oxalate fruits or vegetables.  Other:  Carob.  Chocolate.  Fruitcake.  Marmalades.   This information is not intended to replace advice given to you by your health care provider. Make sure you discuss any questions you have with your health care provider.   Document Released: 10/22/2010 Document Revised: 07/02/2013 Document Reviewed: 05/24/2013 Elsevier Interactive Patient Education 2016 [employer] Inc.  

## 2015-08-21 NOTE — Progress Notes (Signed)
9:54 AM  08/21/2015  Katelyn Holland 07-Oct-1975 [id number]  Referring provider: Loura Pardon, NP [address] [place], [place] [address]  Chief Complaint  Patient presents with  . Nephrolithiasis    27month w/KUB    HPI:  40 year old female status post left PCNL on 07/23/2015.  She did have some initial postoperative bladder spasms which resolved. She continues to have some occasional left flank pain at the incision site. She notes that this is worse with physical activity and when riding in a car and bouncing. No nausea or vomiting. No drainage from the incision.  Follow-up KUB today shows no residual stones.  Stone analysis shows 75% calcium oxalate monohydrate, 50% calcium phosphate, 10% ammonium acid urate.  She does have a personal history stones and is status post gastric sleeve. As such, she has difficulty drinking adequate volume of water. She stopped drinking sodas. She has been drinking Gatorade.   PMH: Past Medical History  Diagnosis Date  . Kidney stone   . Gravida 1 para 1   . Chronic constipation   . Arthritis     Surgical History: Past Surgical History  Procedure Laterality Date  . Abdominal hysterectomy    . Laparoscopic gastric sleeve resection  2016  . Lithotripsy  2007  . Appendectomy    . Back surgery      x 2  . Kidney stone surgery Left 2000    surgery through back  . Nephrolithotomy Left 07/23/2015    Procedure: NEPHROLITHOTOMY PERCUTANEOUS;  Surgeon: Vanna Scotland, MD;  Location: [facility] ORS;  Service: Urology;  Laterality: Left;    Home Medications:    Medication List       This list is accurate as of: 08/21/15  9:54 AM.  Always use your most recent med list.               acyclovir 400 MG tablet  Commonly known as:  ZOVIRAX  Take 1 tablet (400 mg total) by mouth 2 (two) times daily.     cyclobenzaprine 10 MG tablet  Commonly known as:  FLEXERIL  Take 1 tablet (10 mg total) by mouth at bedtime.        Allergies:  Allergies   Allergen Reactions  . Iodinated Diagnostic Agents Nausea And Vomiting    Topical betadine is ok to use.    Family History: Family History  Problem Relation Age of Onset  . Heart disease Mother 72  . Cancer Maternal Grandmother   . Kidney cancer Neg Hx   . Bladder Cancer Neg Hx   . Prostate cancer Neg Hx     Social History:  reports that she has never smoked. She has never used smokeless tobacco. She reports that she drinks alcohol. She reports that she does not use illicit drugs.  ROS: UROLOGY Frequent Urination?: No Hard to postpone urination?: No Burning/pain with urination?: No Get up at night to urinate?: No Leakage of urine?: No Urine stream starts and stops?: Yes Trouble starting stream?: No Do you have to strain to urinate?: No Blood in urine?: No Urinary tract infection?: No Sexually transmitted disease?: No Injury to kidneys or bladder?: No Painful intercourse?: No Weak stream?: No Currently pregnant?: No Vaginal bleeding?: No Last menstrual period?: n  Gastrointestinal Nausea?: No Vomiting?: No Indigestion/heartburn?: No Diarrhea?: No Constipation?: Yes  Constitutional Fever: No Night sweats?: No Weight loss?: No Fatigue?: No  Skin Skin rash/lesions?: No Itching?: No  Eyes Blurred vision?: No Double vision?: No  Ears/Nose/Throat Sore throat?: No Sinus problems?: No  Hematologic/Lymphatic Swollen glands?: No Easy bruising?: No  Cardiovascular Leg swelling?: No Chest pain?: No  Respiratory Cough?: No Shortness of breath?: No  Endocrine Excessive thirst?: No  Musculoskeletal Back pain?: No Joint pain?: No  Neurological Headaches?: No Dizziness?: No  Psychologic Depression?: No Anxiety?: No  Physical Exam: BP 116/76 mmHg  Pulse 87  Ht 5' 4.5" (1.638 m)  Wt 157 lb 4.8 oz (71.351 kg)  BMI 26.59 kg/m2  Constitutional:  Alert and oriented, No acute distress. HEENT: Kelly AT, moist mucus membranes.  Trachea midline, no  masses. Cardiovascular: No clubbing, cyanosis, or edema. Respiratory: Normal respiratory effort, no increased work of breathing. GI: Abdomen is soft, nontender, nondistended, no abdominal masses.   Left nephrostomy tube site well healed, no surrounding erythema, no drainage.  Point tenderness over this incision site with deep palpation. GU: No CVA tenderness.   Skin: No rashes, bruises or suspicious lesions. Neurologic: Grossly intact, no focal deficits, moving all 4 extremities. Psychiatric: Normal mood and affect.  Laboratory Data: Lab Results  Component Value Date   WBC 5.2 07/22/2015   HGB 12.4 07/24/2015   HCT 36.3 07/24/2015   MCV 82.8 07/22/2015   PLT 258 07/22/2015    Lab Results  Component Value Date   CREATININE 0.63 07/24/2015   Results for orders placed or performed in visit on 08/21/15  Microscopic Examination  Result Value Ref Range   WBC, UA 0-5 0 -  5 /hpf   RBC, UA None seen 0 -  2 /hpf   Epithelial Cells (non renal) >10 (A) 0 - 10 /hpf   Renal Epithel, UA 0-10 (A) None seen /hpf   Bacteria, UA None seen None seen/Few  Urinalysis, Complete  Result Value Ref Range   Specific Gravity, UA 1.020 1.005 - 1.030   pH, UA 8.5 (H) 5.0 - 7.5   Color, UA Yellow Yellow   Appearance Ur Cloudy (A) Clear   Leukocytes, UA Negative Negative   Protein, UA Trace (A) Negative/Trace   Glucose, UA Negative Negative   Ketones, UA Negative Negative   RBC, UA Negative Negative   Bilirubin, UA Negative Negative   Urobilinogen, Ur 1.0 0.2 - 1.0 mg/dL   Nitrite, UA Negative Negative   Microscopic Examination See below:      Pertinent Imaging: CLINICAL DATA: Left-sided nephrolithiasis.  EXAM: ABDOMEN - 1 VIEW  COMPARISON: June 08, 2015.  FINDINGS: The bowel gas pattern is normal. Probable phleboliths are seen in the pelvis. Left renal calculi noted on prior exam are no longer present.  IMPRESSION: No evidence of bowel obstruction or ileus. Left  nephrolithiasis noted on prior exam is no longer visualized.   Electronically Signed  By: Lupita Raider, M.D.  On: 08/21/2015 08:55  Assessment & Plan:   1. Nephrolithiasis S/p uncomplicated PCNL with no residual stone burden on KUB today  Given relatively large stone burden, recommend litho-link analysis  Stone prevention techniques briefly discussed today--> We discussed general stone prevention techniques including drinking plenty water with goal of producing 2.5 L urine daily, increased citric acid intake, avoidance of high oxalate containing foods, and decreased salt intake.  Information about dietary recommendations given today.   2. Left flank pain Continued left flan pain over incision, likely musculoskeletal given nature of pain and reproducible pain with palpation at the incision site Unable to tolerate NSAIDs, recommend Tylenol as needed  Will obtain a follow-up renal ultrasound given severity of pre-op hydronephrosis to ensure  resolution (Will call with results)  -RUS  Vanna Scotland, MD  [facility] [address] [place], [place] [address] [phone]

## 2015-08-28 ENCOUNTER — Ambulatory Visit
Admission: RE | Admit: 2015-08-28 | Discharge: 2015-08-28 | Disposition: A | Discharge status: Home / Self Care | Payer: BLUE CROSS/BLUE SHIELD | Source: Ambulatory Visit | Attending: Urology | Admitting: Urology

## 2015-08-28 DIAGNOSIS — R109 Unspecified abdominal pain: Secondary | ICD-10-CM | POA: Diagnosis not present

## 2015-08-28 DIAGNOSIS — N200 Calculus of kidney: Secondary | ICD-10-CM | POA: Insufficient documentation

## 2015-09-07 ENCOUNTER — Telehealth: Payer: Self-pay | Admitting: Urology

## 2015-09-07 DIAGNOSIS — N1339 Other hydronephrosis: Secondary | ICD-10-CM

## 2015-09-07 NOTE — Telephone Encounter (Signed)
Call patient to discuss renal ultrasound results. Unable to reach, left message on machine.  Patient asked to return call to discuss results.  Left chronic hydronephrosis, significantly improved a renal ultrasound but not fully resolved. I would recommend repeating the ultrasound in 2-3 months to assess for complete resolution.   Vanna Scotland, MD

## 2015-09-07 NOTE — Telephone Encounter (Signed)
Notified pt that she needs a RUS in 2-3 months. Hospital Scheduling will call to get this scheduled. Pt voices understanding.

## 2015-09-14 ENCOUNTER — Emergency Department
Admission: EM | Admit: 2015-09-14 | Discharge: 2015-09-14 | Disposition: A | Discharge status: Home / Self Care | Payer: BLUE CROSS/BLUE SHIELD | Attending: Emergency Medicine | Admitting: Emergency Medicine

## 2015-09-14 ENCOUNTER — Encounter: Payer: Self-pay | Admitting: Emergency Medicine

## 2015-09-14 ENCOUNTER — Emergency Department: Payer: BLUE CROSS/BLUE SHIELD

## 2015-09-14 DIAGNOSIS — R1032 Left lower quadrant pain: Secondary | ICD-10-CM | POA: Diagnosis present

## 2015-09-14 DIAGNOSIS — Z79899 Other long term (current) drug therapy: Secondary | ICD-10-CM | POA: Diagnosis not present

## 2015-09-14 DIAGNOSIS — Z87448 Personal history of other diseases of urinary system: Secondary | ICD-10-CM | POA: Insufficient documentation

## 2015-09-14 DIAGNOSIS — R109 Unspecified abdominal pain: Secondary | ICD-10-CM

## 2015-09-14 HISTORY — DX: Calculus of kidney: N20.0

## 2015-09-14 LAB — URINALYSIS COMPLETE WITH MICROSCOPIC (ARMC ONLY)
BACTERIA UA: NONE SEEN
Bilirubin Urine: NEGATIVE
GLUCOSE, UA: NEGATIVE mg/dL
HGB URINE DIPSTICK: NEGATIVE
Ketones, ur: NEGATIVE mg/dL
LEUKOCYTES UA: NEGATIVE
Nitrite: NEGATIVE
PH: 5 (ref 5.0–8.0)
Protein, ur: 30 mg/dL — AB
Specific Gravity, Urine: 1.019 (ref 1.005–1.030)

## 2015-09-14 LAB — COMPREHENSIVE METABOLIC PANEL
ALBUMIN: 5.1 g/dL — AB (ref 3.5–5.0)
ALT: 18 U/L (ref 14–54)
ANION GAP: 8 (ref 5–15)
AST: 21 U/L (ref 15–41)
Alkaline Phosphatase: 68 U/L (ref 38–126)
BUN: 13 mg/dL (ref 6–20)
CO2: 28 mmol/L (ref 22–32)
Calcium: 9.3 mg/dL (ref 8.9–10.3)
Chloride: 101 mmol/L (ref 101–111)
Creatinine, Ser: 0.53 mg/dL (ref 0.44–1.00)
GFR calc Af Amer: >60 mL/min (ref >60)
GFR calc non Af Amer: >60 mL/min (ref >60)
GLUCOSE: 96 mg/dL (ref 65–99)
POTASSIUM: 3.7 mmol/L (ref 3.5–5.1)
SODIUM: 137 mmol/L (ref 135–145)
Total Bilirubin: 0.6 mg/dL (ref 0.3–1.2)
Total Protein: 8.5 g/dL — ABNORMAL HIGH (ref 6.5–8.1)

## 2015-09-14 LAB — CBC
HEMATOCRIT: 42.1 % (ref 35.0–47.0)
Hemoglobin: 14.1 g/dL (ref 12.0–16.0)
MCH: 28.5 pg (ref 26.0–34.0)
MCHC: 33.5 g/dL (ref 32.0–36.0)
MCV: 85 fL (ref 80.0–100.0)
PLATELETS: 212 10*3/uL (ref 150–440)
RBC: 4.95 MIL/uL (ref 3.80–5.20)
RDW: 12.9 % (ref 11.5–14.5)
WBC: 8.1 10*3/uL (ref 3.6–11.0)

## 2015-09-14 MED ORDER — TRAMADOL HCL 50 MG PO TABS: 50.0000 mg | ORAL_TABLET | Freq: Four times a day (QID) | ORAL | Status: DC | PRN

## 2015-09-14 MED ORDER — KETOROLAC TROMETHAMINE 60 MG/2ML IM SOLN
INTRAMUSCULAR | Status: AC
  Administered 2015-09-14: 60 mg via INTRAMUSCULAR
  Filled 2015-09-14: qty 2

## 2015-09-14 MED ORDER — KETOROLAC TROMETHAMINE 60 MG/2ML IM SOLN
60.0000 mg | Freq: Once | INTRAMUSCULAR | Status: AC
  Administered 2015-09-14: 60 mg via INTRAMUSCULAR

## 2015-09-14 NOTE — ED Notes (Signed)
Norma Ignasiak RN called the Lab to add a urine culture to the sample already sent to the lab. Urine culture was added.

## 2015-09-14 NOTE — Discharge Instructions (Signed)
Flank Pain Flank pain refers to pain that is located on the side of the body between the upper abdomen and the back. The pain may occur over a short period of time (acute) or may be long-term or reoccurring (chronic). It may be mild or severe. Flank pain can be caused by many things. CAUSES  Some of the more common causes of flank pain include:  Muscle strains.   Muscle spasms.   A disease of your spine (vertebral disk disease).   A lung infection (pneumonia).   Fluid around your lungs (pulmonary edema).   A kidney infection.   Kidney stones.   A very painful skin rash caused by the chickenpox virus (shingles).   Gallbladder disease.  HOME CARE INSTRUCTIONS  Home care will depend on the cause of your pain. In general,  Rest as directed by your caregiver.  Drink enough fluids to keep your urine clear or pale yellow.  Only take over-the-counter or prescription medicines as directed by your caregiver. Some medicines may help relieve the pain.  Tell your caregiver about any changes in your pain.  Follow up with your caregiver as directed. SEEK IMMEDIATE MEDICAL CARE IF:   Your pain is not controlled with medicine.   You have new or worsening symptoms.  Your pain increases.   You have abdominal pain.   You have shortness of breath.   You have persistent nausea or vomiting.   You have swelling in your abdomen.   You feel faint or pass out.   You have blood in your urine.  You have a fever or persistent symptoms for more than 2-3 days.  You have a fever and your symptoms suddenly get worse. MAKE SURE YOU:   Understand these instructions.  Will watch your condition.  Will get help right away if you are not doing well or get worse.   This information is not intended to replace advice given to you by your health care provider. Make sure you discuss any questions you have with your health care provider.   Document Released: 08/18/2005 Document  Revised: 03/21/2012 Document Reviewed: 02/09/2012 Elsevier Interactive Patient Education [employer]2016 [employer]  Please return immediately if condition worsens. Please contact her primary physician or the physician you were given for referral. If you have any specialist physicians involved in her treatment and plan please also contact them. Thank you for using [facility].

## 2015-09-14 NOTE — ED Notes (Signed)
AAOx3.  Skin warm and dry.  Ambulates with easy and steady gait. NAD 

## 2015-09-14 NOTE — ED Provider Notes (Signed)
Time Seen: Approximately 1720 I have reviewed the triage notes  Chief Complaint: Flank Pain   History of Present Illness: Katelyn Holland is a 40 y.o. female *who presents with a left-sided flank discomfort. Patient has a past history of emphysematous pyelonephritis. Patient has finished her antibiotics and is been followed by a local urologist. She does also have a history of renal colic. Patient presents with acute onset of increasing left flank discomfort with no associated fever, nausea, vomiting. HEENT periodically into the left lower quadrant. Patient denies any dysuria, hematuria, urinary frequency. She states she's been taking some ibuprofen without complete relief.   Past Medical History  Diagnosis Date  . Kidney stone   . Gravida 1 para 1   . Chronic constipation   . Arthritis   . Kidney stones     Patient Active Problem List   Diagnosis Date Noted  . Hydronephrosis 07/09/2015  . 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 Hypokalemia 07/09/2015  . Anemia 07/09/2015  . 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 Kidney stones 07/07/2015  . Chronic constipation 07/07/2015  . Chronic back pain 12/24/2014  . Family history of heart disease in female family member before age 265 12/24/2014  . Low libido 12/24/2014  . Morbid obesity (HCC) 04/10/2014    Past Surgical History  Procedure Laterality Date  . Abdominal hysterectomy    . Laparoscopic gastric sleeve resection  2016  . Lithotripsy  2007  . Appendectomy    . Back surgery      x 2  . Kidney stone surgery Left 2000    surgery through back  . Nephrolithotomy Left 07/23/2015    Procedure: NEPHROLITHOTOMY PERCUTANEOUS;  Surgeon: Vanna ScotlandAshley Brandon, MD;  Location: [facility] ORS;  Service: Urology;  Laterality: Left;    Past Surgical History  Procedure Laterality Date  . Abdominal hysterectomy    . Laparoscopic gastric sleeve resection  2016  . Lithotripsy  2007  . Appendectomy    . Back surgery      x 2   . Kidney stone surgery Left 2000    surgery through back  . Nephrolithotomy Left 07/23/2015    Procedure: NEPHROLITHOTOMY PERCUTANEOUS;  Surgeon: Vanna ScotlandAshley Brandon, MD;  Location: [facility] ORS;  Service: Urology;  Laterality: Left;    Current Outpatient Rx  Name  Route  Sig  Dispense  Refill  . acyclovir (ZOVIRAX) 400 MG tablet   Oral   Take 1 tablet (400 mg total) by mouth 2 (two) times daily.   20 tablet   5   . cyclobenzaprine (FLEXERIL) 10 MG tablet   Oral   Take 1 tablet (10 mg total) by mouth at bedtime.   30 tablet   3   . traMADol (ULTRAM) 50 MG tablet   Oral   Take 1 tablet (50 mg total) by mouth every 6 (six) hours as needed.   20 tablet   0     Allergies:  Iodinated diagnostic agents  Family History: Family History  Problem Relation Age of Onset  . Heart disease Mother 4945  . Cancer Maternal Grandmother   . Kidney cancer Neg Hx   . Bladder Cancer Neg Hx   . Prostate cancer Neg Hx     Social History: Social History  Substance Use Topics  . Smoking status: Never Smoker   . Smokeless tobacco: Never Used  . Alcohol Use: 0.0 - 0.6 oz/week    0-1 Shots of liquor, 0 Standard drinks or equivalent per week  Comment: occasional 1 every couple of months     Review of Systems:   10 point review of systems was performed and was otherwise negative:  Constitutional: No fever Eyes: No visual disturbances ENT: No sore throat, ear pain Cardiac: No chest pain Respiratory: No shortness of breath, wheezing, or stridor Abdomen: No abdominal pain, no vomiting, No diarrhea Endocrine: No weight loss, No night sweats Extremities: No peripheral edema, cyanosis Skin: No rashes, easy bruising Neurologic: No focal weakness, trouble with speech or swollowing Urologic: No dysuria, Hematuria, or urinary frequency   Physical Exam:  ED Triage Vitals  Enc Vitals Group     BP 09/14/15 1611 129/94 mmHg     Pulse Rate 09/14/15 1611 78     Resp 09/14/15 1611 20     Temp  09/14/15 1611 98.5 F (36.9 C)     Temp Source 09/14/15 1611 Oral     SpO2 09/14/15 1611 100 %     Weight 09/14/15 1611 145 lb (65.772 kg)     Height 09/14/15 1611  (1.626 m)     Head Cir --      Peak Flow --      Pain Score 09/14/15 1611 5     Pain Loc --      Pain Edu? --      Excl. in GC? --     General: Awake , Alert , and Oriented times 3; GCS 15 Head: Normal cephalic , atraumatic Eyes: Pupils equal , round, reactive to light Nose/Throat: No nasal drainage, patent upper airway without erythema or exudate.  Neck: Supple, Full range of motion, No anterior adenopathy or palpable thyroid masses Lungs: Clear to ascultation without wheezes , rhonchi, or rales Heart: Regular rate, regular rhythm without murmurs , gallops , or rubs Abdomen mild reproducible pain across the left lower quadrant without rebound, guarding , or rigidity; bowel sounds positive and symmetric in all 4 quadrants. No organomegaly .        Extremities: 2 plus symmetric pulses. No edema, clubbing or cyanosis Neurologic: normal ambulation, Motor symmetric without deficits, sensory intact Skin: warm, dry, no rashes   Labs:   All laboratory work was reviewed including any pertinent negatives or positives listed below:  Labs Reviewed  URINALYSIS COMPLETEWITH MICROSCOPIC ([facility] ONLY) - Abnormal; Notable for the following:    Color, Urine YELLOW (*)    APPearance CLEAR (*)    Protein, ur 30 (*)    Squamous Epithelial / LPF 0-5 (*)    All other components within normal limits  COMPREHENSIVE METABOLIC PANEL - Abnormal; Notable for the following:    Total Protein 8.5 (*)    Albumin 5.1 (*)    All other components within normal limits  URINE CULTURE  CBC   reviewed the laboratory work and urinalysis shows no significant abnormalities  Radiology:       EXAM: RENAL / URINARY TRACT ULTRASOUND COMPLETE  COMPARISON: Ultrasound 08/28/2015. CT 07/07/2015.  FINDINGS: Right Kidney:  Length: 11.4 cm.  Echogenicity within normal limits. No mass or hydronephrosis visualized.  Left Kidney:  Length: 13.8 cm. Chronic left-sided pelvicaliectasis appears mildly increased compared with the most recent ultrasound. No calculi or focal cortical lesions are identified.  Bladder:  Appears normal for degree of bladder distention. There are bilateral ureteral jets.  IMPRESSION: 1. Chronic left-sided pelvicaliectasis appears mildly increased compared with previous ultrasound of 3 weeks ago. However, there are persistent bilateral ureteral jets. 2. The right kidney appears unremarkable.  I personally  reviewed the radiologic studies   ED Course:  Patient received Toradol here with some relief. Given her current clinical presentation and past medical history this could be some scar tissue from her previous infection, a new small kidney stone. Her urine here and appears to be normal and I added a urine culture. She is afebrile and has normal renal function. I felt she could be treated on an outpatient basis and she historically has received recurrent ultrasounds to follow her progress. Patient was advised to touch base with her urologist and return here if she develops a fever or any other new concerns. She was advised continue with ibuprofen and I prescribed her Ultram for breakthrough pain.   Assessment: * Acute left-sided flank pain History of renal colic and pyelonephritis   Final Clinical Impression:  Final diagnoses:  Left sided abdominal pain  Left flank pain     Plan:  Outpatient management Patient was advised to return immediately if condition worsens. Patient was advised to follow up with their primary care physician or other specialized physicians involved in their outpatient care             Jennye Moccasin, MD 09/14/15 [phone]

## 2015-09-14 NOTE — ED Notes (Signed)
Pt states left flank pain, states she recently was admitted to the hospital and had a drain placed in her left kidney, denies any blood in her urine, also states hx of kidney stones

## 2015-09-14 NOTE — ED Notes (Signed)
R flank pain x 2 days.  

## 2015-09-16 LAB — URINE CULTURE: Culture: NO GROWTH

## 2015-09-21 ENCOUNTER — Ambulatory Visit
Admission: RE | Admit: 2015-09-21 | Discharge: 2015-09-21 | Disposition: A | Discharge status: Home / Self Care | Payer: BLUE CROSS/BLUE SHIELD | Source: Ambulatory Visit | Attending: Urology | Admitting: Urology

## 2015-09-21 ENCOUNTER — Other Ambulatory Visit: Payer: BLUE CROSS/BLUE SHIELD

## 2015-09-21 ENCOUNTER — Encounter: Payer: Self-pay | Admitting: Urology

## 2015-09-21 DIAGNOSIS — N1339 Other hydronephrosis: Secondary | ICD-10-CM

## 2015-09-21 DIAGNOSIS — N1330 Unspecified hydronephrosis: Secondary | ICD-10-CM | POA: Insufficient documentation

## 2015-09-29 ENCOUNTER — Telehealth: Payer: Self-pay | Admitting: Urology

## 2015-09-29 ENCOUNTER — Other Ambulatory Visit: Payer: Self-pay | Admitting: Urology

## 2015-09-29 DIAGNOSIS — N1330 Unspecified hydronephrosis: Secondary | ICD-10-CM

## 2015-09-29 NOTE — Telephone Encounter (Signed)
Yes, I see the patient was recently in the emergency room for flank pain. I think is a good idea for her to come back in to the office for further evaluation.  Please make an appointment in the next 1-2 weeks.  Vanna ScotlandAshley Oluwadarasimi Favor, MD

## 2015-09-29 NOTE — Telephone Encounter (Signed)
We have received LithoLink results for this patient.  Do you want me to schedule an office visit to go over the results?

## 2015-09-29 NOTE — Telephone Encounter (Signed)
Spoke with the patient on the phone today, patient notes that she's had chronic pain recurrent infections and recurrent stones in her left kidney for many years.  She's never had a problem with her right kidney. 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    We discussed proceeding with DMSA to assess cortical scarring and overall function of the left kidney. Pending these results, we may discuss left nephrectomy.  Please arrange this study and follow-up thereafter.  Vanna ScotlandAshley Tejal Monroy, MD

## 2015-10-05 ENCOUNTER — Ambulatory Visit
Admission: RE | Admit: 2015-10-05 | Discharge: 2015-10-05 | Disposition: A | Discharge status: Home / Self Care | Payer: BLUE CROSS/BLUE SHIELD | Source: Ambulatory Visit | Attending: Urology | Admitting: Urology

## 2015-10-05 DIAGNOSIS — R93422 Abnormal radiologic findings on diagnostic imaging of left kidney: Secondary | ICD-10-CM | POA: Insufficient documentation

## 2015-10-05 DIAGNOSIS — N1330 Unspecified hydronephrosis: Secondary | ICD-10-CM | POA: Insufficient documentation

## 2015-10-05 MED ORDER — TECHNETIUM TC 99M MERTIATIDE
5.3880 | Freq: Once | INTRAVENOUS | Status: AC | PRN
  Administered 2015-10-05: 5.388 via INTRAVENOUS

## 2015-10-05 MED ORDER — FUROSEMIDE 10 MG/ML IJ SOLN
20.0000 mg | Freq: Once | INTRAMUSCULAR | Status: AC
  Administered 2015-10-05: 20 mg via INTRAVENOUS
  Filled 2015-10-05 (×2): qty 2

## 2015-10-05 NOTE — Telephone Encounter (Signed)
She had her study done today and she will follow up with you on Wednesday at 8:30   CamdenMichelle

## 2015-10-07 ENCOUNTER — Ambulatory Visit (INDEPENDENT_AMBULATORY_CARE_PROVIDER_SITE_OTHER): Payer: BLUE CROSS/BLUE SHIELD | Admitting: Urology

## 2015-10-07 ENCOUNTER — Encounter: Payer: Self-pay | Admitting: Urology

## 2015-10-07 VITALS — BP 112/72 | HR 76 | Ht 64.0 in | Wt 160.0 lb

## 2015-10-07 DIAGNOSIS — N1330 Unspecified hydronephrosis: Secondary | ICD-10-CM

## 2015-10-07 DIAGNOSIS — N200 Calculus of kidney: Secondary | ICD-10-CM

## 2015-10-07 DIAGNOSIS — R109 Unspecified abdominal pain: Secondary | ICD-10-CM

## 2015-10-07 NOTE — Progress Notes (Signed)
10/07/2015 7:23 PM   Katelyn Holland 01/28/1976 [id number]  Referring provider: Loura Pardon, NP [address] [place], [place] [address]  Chief Complaint  Patient presents with  . Results    HPI: 40 year old female who returns today for further discussion of her chronic left flank pain.   She has a complicated history of left-sided recurrent stones, chronic flank pain, and recurrent pyelonephritis/urinary tract infections/ emphysematous pyelitis.  She is undergone multiple operative procedures on her left kidney including ESWL, ureteroscopy, PCNL x multiple most recently 08/21/2015.  She has been followed in the past by Dr. Orson Slick, Dr. Achilles Dunk, Dr. Bryson Ha, and most recently myself.  This is been going on for approximately 15 or more years.    She is a known chronically hydronephrotic left kidney which she reports is secondary to a large obstructing "goose egg" sized calculus and was told that her "kidney formed around the stone".  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  Several studies were suggestive of a left UPJ obstruction, however, she said several Lasix renal scans without evidence of obstruction.    She's had a metabolic workups in the past and at one point was on chlorthalidone 25 mg every other day as well as Polycitra-K. Despite this, she continued to make stones only on the left side.  She's never had a right-sided stone.   She returns today after repeat Lasix renal scan to assess overall function of her left kidney as well as to assess for obstruction. This demonstrates the left kidney with approximate 40% function is slightly delayed nephrogram (T1/2 14 min) but not diagnostic for an obstruction.   She continues to have severe chronic left flank pain which has been present for many years.   She has days where she can barely move or function.   She does intermittently require  narcotics but tries to avoid as much as possible. She is to the point where she would like to have her left kidney removed.    She's had multiple abdominal surgeries including a laparoscopic gastric sleeve, open appendectomy, hysterectomy.  PMH: Past Medical History  Diagnosis Date  . Kidney stone   . Gravida 1 para 1   . Chronic constipation   . Arthritis   . Kidney stones     Surgical History: Past Surgical History  Procedure Laterality Date  . Abdominal hysterectomy    . Laparoscopic gastric sleeve resection  2016  . Lithotripsy  2007  . Appendectomy    . Back surgery      x 2  . Kidney stone surgery Left 2000    surgery through back  . Nephrolithotomy Left 07/23/2015    Procedure: NEPHROLITHOTOMY PERCUTANEOUS;  Surgeon: Vanna Scotland, MD;  Location: [facility] ORS;  Service: Urology;  Laterality: Left;    Home Medications:    Medication List       This list is accurate as of: 10/07/15 11:59 PM.  Always use your most recent med list.               acyclovir 400 MG tablet  Commonly known as:  ZOVIRAX  Take 1 tablet (400 mg total) by mouth 2 (two) times daily.     cyclobenzaprine 10 MG tablet  Commonly known as:  FLEXERIL  Take 1 tablet (10 mg total) by mouth at bedtime.        Allergies:  Allergies  Allergen Reactions  . Iodinated Diagnostic Agents Nausea And Vomiting    Topical betadine is ok to use.    Family History: Family History  Problem Relation Age of Onset  . Heart disease Mother 29  . Cancer Maternal Grandmother   . Kidney cancer Neg Hx   . Bladder Cancer Neg Hx   . Prostate cancer Neg Hx     Social History:  reports that she has never smoked. She has never used smokeless tobacco. She reports that she drinks alcohol. She reports that she does not use illicit drugs.  ROS: UROLOGY Frequent Urination?: No Hard to postpone urination?: No Burning/pain with urination?: No Get up at night to urinate?: No Leakage of urine?: No Urine stream  starts and stops?: No Trouble starting stream?: Yes Do you have to strain to urinate?: No Blood in urine?: No Urinary tract infection?: No Sexually transmitted disease?: No Injury to kidneys or bladder?: No Painful intercourse?: No Weak stream?: No Currently pregnant?: No Vaginal bleeding?: No Last menstrual period?: n  Gastrointestinal Nausea?: No Vomiting?: No Indigestion/heartburn?: No Diarrhea?: No Constipation?: No  Constitutional Fever: No Night sweats?: No Weight loss?: No Fatigue?: No  Skin Skin rash/lesions?: No Itching?: No  Eyes Blurred vision?: No Double vision?: No  Ears/Nose/Throat Sore throat?: No Sinus problems?: No  Hematologic/Lymphatic Swollen glands?: No Easy bruising?: No  Cardiovascular Leg swelling?: No Chest pain?: No  Respiratory Cough?: No Shortness of breath?: No  Endocrine Excessive thirst?: No  Musculoskeletal Back pain?: No Joint pain?: No  Neurological Headaches?: No Dizziness?: No  Psychologic Depression?: No Anxiety?: No  Physical Exam: BP 112/72 mmHg  Pulse 76  Ht  (1.626 m)  Wt 160 lb (72.576 kg)  BMI 27.45 kg/m2  Constitutional:  Alert and oriented, No acute distress.  Presents with husband today. HEENT: [place] AT, moist mucus membranes.  Trachea midline, no masses. Cardiovascular: No clubbing, cyanosis, or edema.Marland Kitchen Respiratory: Normal respiratory effort, no increased work of breathing.   GI: Abdomen is soft, nontender, nondistended, no abdominal masses.  Multiple laparoscopic scars including a midline upper abdomen, periumbilical and left lateral abdomen. Right lower quadrant open appendectomy scar. Lower midline abdominal scar (Pfannenstiel) GU: Mild L CVA tenderness.  Incision well healed (PCNL) Skin: No rashes, bruises or suspicious lesions. Neurologic: Grossly intact, no focal deficits, moving all 4 extremities. Psychiatric: Normal mood and affect.  Laboratory Data: Lab Results  Component Value  Date   WBC 8.1 09/14/2015   HGB 14.1 09/14/2015   HCT 42.1 09/14/2015   MCV 85.0 09/14/2015   PLT 212 09/14/2015    Lab Results  Component Value Date   CREATININE 0.53 09/14/2015    Pertinent Imaging: Study Result     CLINICAL DATA: Evaluate left-sided hydronephrosis.  EXAM: NUCLEAR MEDICINE RENAL SCAN WITH DIURETIC ADMINISTRATION  TECHNIQUE: Radionuclide angiographic and sequential renal images were obtained after intravenous injection of radiopharmaceutical. Imaging was continued during slow intravenous injection of Lasix approximately 15 minutes after the start of the examination.  RADIOPHARMACEUTICALS: 5.38 mCi Technetium-71m MAG3 IV  COMPARISON: Ultrasound 09/21/2015.  FINDINGS: Flow: Prompt symmetric arterial flow to the kidneys.  Left renogram: The left kidney is enlarged and hydronephrotic. There is symmetric cortical uptake and excretion of the radiopharmaceutical. Diminished clearance of the radiopharmaceutical from the dilated right renal collecting system is identified.  Right renogram: Normal cortical uptake, excretion in clearance of the radiopharmaceutical.  Differential:  Left kidney = 60 %  Right kidney = 40 %  T1/2 post Lasix :  Left kidney = approximately 14 min  Right kidney = approximately 5 min  IMPRESSION: 1. Findings are compatible with low grade partial obstruction of the left kidney. 2. Normally functioning right kidney.    Study Result     CLINICAL DATA: Two week history of progressively worsening left flank pain, dysuria and hematuria. Personal history of urinary tract infections and urinary tract calculi requiring lithotripsy. Surgical history also includes a gastric sleeve procedure for obesity.  EXAM: CT ABDOMEN AND PELVIS WITHOUT CONTRAST  TECHNIQUE: Multidetector CT imaging of the abdomen and pelvis was performed following the standard protocol without IV contrast.  COMPARISON: Ten  prior CT abdomen and pelvis examinations dating back to November, 2005, most recently 07/09/2014.  FINDINGS: Lower chest: Heart size normal. Visualized lung bases clear.  Hepatobiliary: Normal unenhanced appearance of the liver. Gallbladder normal in appearance without calcified gallstones. No biliary ductal dilation.  Pancreas: Normal unenhanced appearance.  Spleen: Normal unenhanced appearance.  Adrenals/Urinary Tract: Normal appearing adrenal glands. Chronic left hydronephrosis. No evidence of left ureteral dilation. No obstructing left ureteral calculus. Numerous small stone fragments in lower pole calices of the left kidney, the largest approximating 5 mm. Asymmetric enlargement of the left kidney relative to the right, unchanged. Gas within an independent upper pole calix of the left kidney, a new finding. Normal unenhanced appearance of the right kidney. No right urinary tract calculi. Gas within the otherwise normal-appearing urinary bladder.  Stomach/Bowel: Postsurgical changes related to the prior gastric sleeve procedure. No complicating features. Normal-appearing small bowel. Large colonic stool burden. Tortuous, redundant sigmoid colon. No evidence of diverticulosis or other focal colonic abnormality. Appendix not clearly visualized, but no pericecal inflammation.  Vascular/Lymphatic: No visible aortoiliofemoral atherosclerosis. No pathologic lymphadenopathy.  Reproductive: Uterus surgically absent. No adnexal masses.  Other: None.  Musculoskeletal: Degenerative disc disease at L4-5 and L5-S1 with likely chronic disc protrusions as there is calcification in the posterior annular fibers at both levels. Facet degenerative changes at these levels.  IMPRESSION: 1. Chronic left hydronephrosis dating back to at least 2005 with numerous nonobstructing stone fragments in lower pole calices, the largest fragment approximating 5 mm. 2. Gas within an  upper pole calix of the left kidney and gas within the urinary bladder. In the absence of recent instrumentation, infection with a gas forming organism is suspected. 3. No evidence of right urinary tract calculi. 4. Postsurgical changes related to the prior gastric sleeve procedure, without complication. 5. Large colonic stool burden.   Electronically Signed  By: Hulan Saas M.D.  On: 07/07/2015 20:26   Both studies reviewed today personally and with the patient.    Assessment & Plan:    1. Left flank pain Chronic left flank pain with recurrent stones, history of left empyematous pyelonephritis, and hydronephrosis x many years.  On CT scan, the left kidney appears grossly abnormal with somewhat of an XGP appearance.    Lengthy discussion today about consideration of possible left nephrectomy.  We discussed that it does seem reasonable to consider at this point given long history of pain, infections, and stones.  I do have some concerns about removing a functional kidney in a young adult.  Additionally, I have some concerns that her pain may not resolve with nephrectomy.  We discussed the procedure itself, risks/ benefits, and recovery.    I would like get some additional opinions and review case with additional Urology peers.    2. Hydronephrosis, left As above.  Equivocal for obstruction.  Longstanding without significant loss of  function.    3. Recurrent kidney stones Left kidney only   Will call with plan after discussing case further with colleagues.    Vanna ScotlandAshley Dina Warbington, MD    [facility] [address], [address] [place], [place] [address] [phone]  I spent 30 min with this patient of which greater than 50% was spent in counseling and coordination of care with the patient.

## 2015-10-09 ENCOUNTER — Telehealth: Payer: Self-pay | Admitting: Urology

## 2015-10-09 DIAGNOSIS — R109 Unspecified abdominal pain: Secondary | ICD-10-CM

## 2015-10-09 NOTE — Telephone Encounter (Signed)
Pt was wondering if there was any way they could get an rx for Tramadol like she was getting in the hospital. Pt states that she's hurting in her side. Pt [facility] [place]. Please advise

## 2015-10-10 ENCOUNTER — Other Ambulatory Visit: Payer: Self-pay | Admitting: Urology

## 2015-10-12 MED ORDER — SULFAMETHOXAZOLE-TRIMETHOPRIM 400-80 MG PO TABS: 1.0000 | ORAL_TABLET | Freq: Every day | ORAL | Status: DC

## 2015-10-12 NOTE — Telephone Encounter (Signed)
Called patient to discuss the plan. I have discussed her case with several colleagues including Dr. Hadley PenBrian Budzyn and Dr. Cleda MccreedyPatrick Mackenzie and imaging reviewed.  Given that the left kidney still does have fairly decent function, each of us have reservations about proceeding nephrectomy at this point.  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  In addition, I would like to refer the patient to pain clinic for consideration of medications such as gabapentin and/or acupuncture for better pain relief. I'm not comfortable continue to prescribe her narcotic/tramadol for this chronic issue and would prefer to manage her pain with non-or chronic options. She reports that some days are fairly okay but other days she has crippling pain which she has difficulty getting out of bed and going to work. She is taking Tylenol which not helping much.    I would like to see her back in 3 months to reassess.    Vanna ScotlandAshley Venson Ferencz, MD

## 2015-10-12 NOTE — Telephone Encounter (Signed)
Pt's husband Judie GrieveBryan called back in regards to this wondering why the Rx for Tramadol hasn't been sent to the pharmacy. The patient would like a phone call regarding this.  Pt's ph# [phone] and [phone]. Thank you.

## 2015-10-12 NOTE — Telephone Encounter (Signed)
Pt was wondering if there was any way they could get an rx for Tramadol like she was getting in the hospital. Pt states that she's hurting in her side. Pt uses the [facility] in [place]. Please advise         I saw this message over the weekend.  Would you like me to write her a script?

## 2015-10-13 NOTE — Telephone Encounter (Signed)
I have sent the referral and I made her follow up appt. I called and Left her a message to call back and I also mailed it to her.  Marcelino DusterMichelle

## 2016-01-15 ENCOUNTER — Encounter: Payer: BLUE CROSS/BLUE SHIELD | Admitting: Family Medicine

## 2016-01-15 ENCOUNTER — Telehealth: Payer: Self-pay | Admitting: Urology

## 2016-01-15 NOTE — Telephone Encounter (Signed)
Marcelino DusterMichelle, can you follow up with this on Monday?  Vanna ScotlandAshley Lily Kernen, MD

## 2016-01-15 NOTE — Telephone Encounter (Signed)
Pt called today to confirm her appt with Dr. Apolinar JunesBrandon next week.  She still hasn't heard anything from the pain clinic about an appt.  It looks like Marcelino DusterMichelle called and mailed something in March.  Please advise.

## 2016-01-18 NOTE — Telephone Encounter (Signed)
I have already addressed this with the patient and advised her that they have been trying to reach her since April and that she has not returned their calls. I have verified her number's with them and the patient and they are all correct.  They have attempted to call her 3 times and I have called them and they can not make the appt with me they need to speak to her.  I have called again and left the patient another message to call me back to discuss this.  Thanks, Marcelino DusterMichelle

## 2016-01-19 NOTE — Telephone Encounter (Signed)
I spoke with patient today and she said she would call the pain clinic and make an appointment. She reschd her appt with you for the 12th due to a death in her family.   Thanks  Western & Southern FinancialMichelle

## 2016-01-20 ENCOUNTER — Ambulatory Visit: Payer: BLUE CROSS/BLUE SHIELD | Admitting: Urology

## 2016-01-31 ENCOUNTER — Encounter: Payer: Self-pay | Admitting: Emergency Medicine

## 2016-01-31 ENCOUNTER — Emergency Department
Admission: EM | Admit: 2016-01-31 | Discharge: 2016-01-31 | Disposition: A | Discharge status: Home / Self Care | Payer: BLUE CROSS/BLUE SHIELD | Attending: Emergency Medicine | Admitting: Emergency Medicine

## 2016-01-31 ENCOUNTER — Emergency Department: Payer: BLUE CROSS/BLUE SHIELD

## 2016-01-31 DIAGNOSIS — R109 Unspecified abdominal pain: Secondary | ICD-10-CM | POA: Insufficient documentation

## 2016-01-31 LAB — URINALYSIS COMPLETE WITH MICROSCOPIC (ARMC ONLY)
Bacteria, UA: NONE SEEN
SPECIFIC GRAVITY, URINE: 1.01 (ref 1.005–1.030)

## 2016-01-31 LAB — CBC WITH DIFFERENTIAL/PLATELET
BASOS ABS: 0 10*3/uL (ref 0–0.1)
BASOS PCT: 1 %
Eosinophils Absolute: 0.1 10*3/uL (ref 0–0.7)
Eosinophils Relative: 1 %
HCT: 38.9 % (ref 35.0–47.0)
Hemoglobin: 13.2 g/dL (ref 12.0–16.0)
LYMPHS PCT: 36 %
Lymphs Abs: 2.1 10*3/uL (ref 1.0–3.6)
MCH: 28.7 pg (ref 26.0–34.0)
MCHC: 33.9 g/dL (ref 32.0–36.0)
MCV: 84.7 fL (ref 80.0–100.0)
MONO ABS: 0.2 10*3/uL (ref 0.2–0.9)
MONOS PCT: 4 %
Neutro Abs: 3.4 10*3/uL (ref 1.4–6.5)
Neutrophils Relative %: 58 %
Platelets: 201 10*3/uL (ref 150–440)
RBC: 4.6 MIL/uL (ref 3.80–5.20)
RDW: 12.5 % (ref 11.5–14.5)
WBC: 5.8 10*3/uL (ref 3.6–11.0)

## 2016-01-31 LAB — BASIC METABOLIC PANEL
ANION GAP: 6 (ref 5–15)
BUN: 10 mg/dL (ref 6–20)
CO2: 27 mmol/L (ref 22–32)
CREATININE: 0.58 mg/dL (ref 0.44–1.00)
Calcium: 8.8 mg/dL — ABNORMAL LOW (ref 8.9–10.3)
Chloride: 108 mmol/L (ref 101–111)
GFR calc Af Amer: >60 mL/min (ref >60)
GFR calc non Af Amer: >60 mL/min (ref >60)
GLUCOSE: 95 mg/dL (ref 65–99)
Potassium: 4.3 mmol/L (ref 3.5–5.1)
Sodium: 141 mmol/L (ref 135–145)

## 2016-01-31 MED ORDER — DEXTROSE 5 % IV SOLN
INTRAVENOUS | Status: AC
  Administered 2016-01-31: 1 g via INTRAVENOUS
  Filled 2016-01-31: qty 10

## 2016-01-31 MED ORDER — ONDANSETRON HCL 4 MG/2ML IJ SOLN
4.0000 mg | Freq: Once | INTRAMUSCULAR | Status: AC
  Administered 2016-01-31: 4 mg via INTRAVENOUS
  Filled 2016-01-31: qty 2

## 2016-01-31 MED ORDER — TRAMADOL HCL 50 MG PO TABS: 50.0000 mg | ORAL_TABLET | Freq: Four times a day (QID) | ORAL | 0 refills | Status: DC | PRN

## 2016-01-31 MED ORDER — CEPHALEXIN 500 MG PO CAPS: 500.0000 mg | ORAL_CAPSULE | Freq: Three times a day (TID) | ORAL | 0 refills | Status: DC

## 2016-01-31 MED ORDER — MORPHINE SULFATE (PF) 4 MG/ML IV SOLN
4.0000 mg | Freq: Once | INTRAVENOUS | Status: AC
  Administered 2016-01-31: 4 mg via INTRAVENOUS
  Filled 2016-01-31: qty 1

## 2016-01-31 MED ORDER — SODIUM CHLORIDE 0.9 % IV BOLUS (SEPSIS)
1000.0000 mL | Freq: Once | INTRAVENOUS | Status: AC
  Administered 2016-01-31: 1000 mL via INTRAVENOUS

## 2016-01-31 MED ORDER — DEXTROSE 5 % IV SOLN
1.0000 g | Freq: Once | INTRAVENOUS | Status: AC
  Administered 2016-01-31: 1 g via INTRAVENOUS

## 2016-01-31 NOTE — ED Triage Notes (Signed)
Pt c/o left flank pain. Has had history of kidney infections and kidney stones; not sure which one this is. Denies fevers. Denies hematuria.

## 2016-01-31 NOTE — Discharge Instructions (Signed)
Please seek medical attention for any high fevers, chest pain, shortness of breath, change in behavior, persistent vomiting, bloody stool or any other new or concerning symptoms.  

## 2016-01-31 NOTE — ED Provider Notes (Addendum)
[facility] Emergency Department Provider Note    ____________________________________________   I have reviewed the triage vital signs and the nursing notes.   HISTORY  Chief Complaint Flank Pain   History limited by: Not Limited   HPI Katelyn Holland is a 40 y.o. female who presents to the emergency department tonight because of concern for left flank pain. Pain started this morning. Has been consistent. It is severe. Has had similar pain in the past with kidney stone and kidney infection. 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 She denies any fevers today.     Past Medical History:  Diagnosis Date  . Arthritis   . Chronic constipation   . Gravida 1 para 1   . Kidney stone   . Kidney stones     Patient Active Problem List   Diagnosis Date Noted  . Hydronephrosis 07/09/2015  . 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 Hypokalemia 07/09/2015  . Anemia 07/09/2015  . 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 Kidney stones 07/07/2015  . Chronic constipation 07/07/2015  . Chronic back pain 12/24/2014  . Family history of heart disease in female family member before age 40 12/24/2014  . Low libido 12/24/2014  . Morbid obesity (HCC) 04/10/2014    Past Surgical History:  Procedure Laterality Date  . ABDOMINAL HYSTERECTOMY    . APPENDECTOMY    . BACK SURGERY     x 2  . KIDNEY STONE SURGERY Left 2000   surgery through back  . LAPAROSCOPIC GASTRIC SLEEVE RESECTION  2016  . LITHOTRIPSY  2007  . NEPHROLITHOTOMY Left 07/23/2015   Procedure: NEPHROLITHOTOMY PERCUTANEOUS;  Surgeon: Vanna Scotland, MD;  Location: [facility] ORS;  Service: Urology;  Laterality: Left;    Current Outpatient Rx  . Order #: [id number] Class: Normal  . Order #: [id number] Class: Normal  . Order #: [id number] Class: Normal    Allergies Iodinated diagnostic agents  Family History  Problem Relation Age of Onset   . Heart disease Mother 7  . Cancer Maternal Grandmother   . Kidney cancer Neg Hx   . Bladder Cancer Neg Hx   . Prostate cancer Neg Hx     Social History Social History  Substance Use Topics  . Smoking status: Never Smoker  . Smokeless tobacco: Never Used  . Alcohol use 0.0 - 0.6 oz/week     Comment: occasional 1 every couple of months    Review of Systems  Constitutional: Negative for fever. Cardiovascular: Negative for chest pain. Respiratory: Negative for shortness of breath. Gastrointestinal: Positive for left flank pain. Genitourinary: Negative for dysuria. Musculoskeletal: Negative for back pain. Skin: Negative for rash. Neurological: Negative for headaches, focal weakness or numbness.  10-point ROS otherwise negative.  ____________________________________________   PHYSICAL EXAM:  VITAL SIGNS: ED Triage Vitals [01/31/16 1814]  Enc Vitals Group     BP 115/61     Pulse Rate 72     Resp 18     Temp 98.5 F (36.9 C)     Temp Source Oral     SpO2 99 %     Weight 135 lb (61.2 kg)     Height  (1.626 m)     Head Circumference      Peak Flow      Pain Score 3   Constitutional: Alert and oriented. Well appearing and in no distress. Eyes: Conjunctivae are normal. PERRL. Normal extraocular movements. ENT   Head: Normocephalic and atraumatic.  Nose: No congestion/rhinnorhea.   Mouth/Throat: Mucous membranes are moist.   Neck: No stridor. Hematological/Lymphatic/Immunilogical: No cervical lymphadenopathy. Cardiovascular: Normal rate, regular rhythm.  No murmurs, rubs, or gallops. Respiratory: Normal respiratory effort without tachypnea nor retractions. Breath sounds are clear and equal bilaterally. No wheezes/rales/rhonchi. Gastrointestinal: Soft and nontender. No distention. Mild left sided CVA tenderness.  Genitourinary: Deferred Musculoskeletal: Normal range of motion in all extremities. No joint effusions.  No lower extremity tenderness  nor edema. Neurologic:  Normal speech and language. No gross focal neurologic deficits are appreciated.  Skin:  Skin is warm, dry and intact. No rash noted. Psychiatric: Mood and affect are normal. Speech and behavior are normal. Patient exhibits appropriate insight and judgment.  ____________________________________________    LABS (pertinent positives/negatives)  Labs Reviewed  URINALYSIS COMPLETEWITH MICROSCOPIC ([facility] ONLY) - Abnormal; Notable for the following:       Result Value   Color, Urine ORANGE (*)    APPearance CLEAR (*)    Glucose, UA   (*)    Value: TEST NOT REPORTED DUE TO COLOR INTERFERENCE OF URINE PIGMENT   Bilirubin Urine   (*)    Value: TEST NOT REPORTED DUE TO COLOR INTERFERENCE OF URINE PIGMENT   Ketones, ur   (*)    Value: TEST NOT REPORTED DUE TO COLOR INTERFERENCE OF URINE PIGMENT   Hgb urine dipstick   (*)    Value: TEST NOT REPORTED DUE TO COLOR INTERFERENCE OF URINE PIGMENT   Protein, ur   (*)    Value: TEST NOT REPORTED DUE TO COLOR INTERFERENCE OF URINE PIGMENT   Nitrite   (*)    Value: TEST NOT REPORTED DUE TO COLOR INTERFERENCE OF URINE PIGMENT   Leukocytes, UA   (*)    Value: TEST NOT REPORTED DUE TO COLOR INTERFERENCE OF URINE PIGMENT   Squamous Epithelial / LPF 0-5 (*)    All other components within normal limits  BASIC METABOLIC PANEL - Abnormal; Notable for the following:    Calcium 8.8 (*)    All other components within normal limits  CBC WITH DIFFERENTIAL/PLATELET     ____________________________________________   EKG  None  ____________________________________________    RADIOLOGY  US renal IMPRESSION: 1. Chronic left pelviectasis, overall similar relative to prior ultrasound from 09/14/2015. Bilateral ureteral jets are present. 2. Echogenic foci within the left kidney measuring up to 8 mm, suspicious for left renal nephrolithiasis. 3. Normal ultrasound of the right kidney.  Abd x-ray IMPRESSION: Negative abdominal  radiographs.   ____________________________________________   PROCEDURES  Procedures  ____________________________________________   INITIAL IMPRESSION / ASSESSMENT AND PLAN / ED COURSE  Pertinent labs & imaging results that were available during my care of the patient were reviewed by me and considered in my medical decision making (see chart for details).  Patient presented for left flank pain. Abdominal x-ray and [place] without concerning findings. No leukocytosis on blood work. Will however cover with antibiotics given history of concerning infection  ____________________________________________   FINAL CLINICAL IMPRESSION(S) / ED DIAGNOSES  Left flank pain  Note: This dictation was prepared with Dragon dictation. Any transcriptional errors that result from this process are unintentional    Phineas Semen, MD 01/31/16 2231    Phineas Semen, MD 01/31/16 2245

## 2016-02-05 ENCOUNTER — Encounter: Payer: Self-pay | Admitting: Urology

## 2016-02-05 ENCOUNTER — Ambulatory Visit (INDEPENDENT_AMBULATORY_CARE_PROVIDER_SITE_OTHER): Payer: BLUE CROSS/BLUE SHIELD | Admitting: Urology

## 2016-02-05 VITALS — BP 114/76 | HR 76 | Ht 64.0 in | Wt 168.9 lb

## 2016-02-05 DIAGNOSIS — N200 Calculus of kidney: Secondary | ICD-10-CM | POA: Diagnosis not present

## 2016-02-05 DIAGNOSIS — R1032 Left lower quadrant pain: Secondary | ICD-10-CM

## 2016-02-05 DIAGNOSIS — R109 Unspecified abdominal pain: Secondary | ICD-10-CM

## 2016-02-05 DIAGNOSIS — N1330 Unspecified hydronephrosis: Secondary | ICD-10-CM | POA: Diagnosis not present

## 2016-02-05 LAB — URINALYSIS, COMPLETE
Bilirubin, UA: NEGATIVE
GLUCOSE, UA: NEGATIVE
KETONES UA: NEGATIVE
Leukocytes, UA: NEGATIVE
NITRITE UA: NEGATIVE
Protein, UA: NEGATIVE
RBC, UA: NEGATIVE
SPEC GRAV UA: 1.025 (ref 1.005–1.030)
UUROB: 0.2 mg/dL (ref 0.2–1.0)
pH, UA: 5.5 (ref 5.0–7.5)

## 2016-02-05 LAB — MICROSCOPIC EXAMINATION
Epithelial Cells (non renal): >10 /hpf — AB (ref 0–10)
WBC, UA: NONE SEEN /hpf (ref 0–?)

## 2016-02-05 MED ORDER — SULFAMETHOXAZOLE-TRIMETHOPRIM 400-80 MG PO TABS: 1.0000 | ORAL_TABLET | Freq: Every day | ORAL | 5 refills | Status: DC

## 2016-02-05 NOTE — Progress Notes (Signed)
02/05/2016 1:10 PM   Katelyn Holland Nov 15, 1975 [id number]  Referring provider: Loura Pardon, NP [address] [place], [place] [address]  Chief Complaint  Patient presents with  . Flank Pain    left side flank pain, nephrolithiasis    HPI: 40 year old female who returns today for 4 months follow up.     She has a complicated history of left-sided recurrent stones, chronic flank pain, and recurrent pyelonephritis/urinary tract infections/ emphysematous pyelitis.  She is undergone multiple operative procedures on her left kidney including ESWL, ureteroscopy, PCNL x multiple most recently 08/21/2015.  She has been followed in the past by Dr. Orson Slick, Dr. Achilles Dunk, Dr. Bryson Ha, and most recently myself.  This is been going on for approximately 15 or more years.    She is a known chronically hydronephrotic left kidney which she reports is secondary to a large obstructing "goose egg" sized calculus and was told that her "kidney formed around the stone".  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  Several studies were suggestive of a left UPJ obstruction, however, she said several Lasix renal scans without evidence of obstruction.    She's had a metabolic workups in the past and at one point was on chlorthalidone 25 mg every other day as well as Polycitra-K. Despite this, she continued to make stones only on the left side.  She's never had a right-sided stone.  Lasix renal scan 3/ 2017 demonstrates the left kidney with approximate 40% function is slightly delayed nephrogram (T1/2 14 min) but not diagnostic for an obstruction.   She continues to have severe chronic left flank pain which has been present for many years.  Since last visit, she was prescribed low-dose daily Bactrim for  UTI/pyelonephritis suppression.  She took this medication for 1 month but never got refills. She reports that the medication  and actually worked quite well and she noticed that her urine began clearing, her flank pain resolved, and she was feeling generally very well during this time period. She did not realize that she had additional refills.  Thereafter, she began to develop progressively worsening left flank pain and wishes consistent with her chronic pain. Over the weekend, the pain became much worse and she also developed urinary symptoms. She was seen in the emergency room at which time a KUB showed no significant stones and a renal ultrasound showed stable caliectasis with a possible 8 mm stone.  UA at the time was not particularly suspicious. Urine culture was not sent. She was started on cephalexin as a precaution.  Just today, she began to develop left lower quadrant pain which is not typical location for pain. She denies any associated nausea or vomiting.  She was referred to the pain clinic after last visit but is been playing phone tag it is never actually made an appointment there.   PSH  significant for multiple abdominal surgeries including a laparoscopic gastric sleeve, open appendectomy, hysterectomy.  PMH: Past Medical History:  Diagnosis Date  . Arthritis   . Chronic constipation   . Gravida 1 para 1   . Kidney stone   . Kidney stones     Surgical History: Past Surgical History:  Procedure Laterality Date  . ABDOMINAL HYSTERECTOMY    . APPENDECTOMY    . BACK SURGERY     x 2  . KIDNEY STONE SURGERY Left 2000   surgery through  back  . LAPAROSCOPIC GASTRIC SLEEVE RESECTION  2016  . LITHOTRIPSY  2007  . NEPHROLITHOTOMY Left 07/23/2015   Procedure: NEPHROLITHOTOMY PERCUTANEOUS;  Surgeon: Vanna Scotland, MD;  Location: [facility] ORS;  Service: Urology;  Laterality: Left;    Home Medications:    Medication List       Accurate as of 02/05/16  1:10 PM. Always use your most recent med list.          acyclovir 400 MG tablet Commonly known as:  ZOVIRAX Take 1 tablet (400 mg total) by mouth 2  (two) times daily.   cephALEXin 500 MG capsule Commonly known as:  KEFLEX Take 1 capsule (500 mg total) by mouth 3 (three) times daily.   traMADol 50 MG tablet Commonly known as:  ULTRAM Take 1 tablet (50 mg total) by mouth every 6 (six) hours as needed.       Allergies:  Allergies  Allergen Reactions  . Dilaudid  [Hydromorphone Hcl] Itching    Uncoded Allergy. Allergen: DIULADID  . Iodinated Diagnostic Agents Nausea And Vomiting and Nausea Only    Other reaction(s): Dizziness Topical betadine is ok to use.    Family History: Family History  Problem Relation Age of Onset  . Heart disease Mother 21  . Cancer Maternal Grandmother   . Kidney cancer Neg Hx   . Bladder Cancer Neg Hx   . Prostate cancer Neg Hx     Social History:  reports that she has never smoked. She has never used smokeless tobacco. She reports that she drinks alcohol. She reports that she does not use drugs.  ROS: UROLOGY Frequent Urination?: No Hard to postpone urination?: No Burning/pain with urination?: No Get up at night to urinate?: No Leakage of urine?: No Urine stream starts and stops?: No Trouble starting stream?: Yes Do you have to strain to urinate?: No Blood in urine?: No Urinary tract infection?: No Sexually transmitted disease?: No Injury to kidneys or bladder?: No Painful intercourse?: No Weak stream?: No Currently pregnant?: No Vaginal bleeding?: No Last menstrual period?: n  Gastrointestinal Nausea?: No Vomiting?: No Indigestion/heartburn?: No Diarrhea?: No Constipation?: No  Constitutional Fever: No Night sweats?: No Weight loss?: No Fatigue?: No  Skin Skin rash/lesions?: No Itching?: No  Eyes Blurred vision?: No Double vision?: No  Ears/Nose/Throat Sore throat?: No Sinus problems?: No  Hematologic/Lymphatic Swollen glands?: No Easy bruising?: No  Cardiovascular Leg swelling?: No Chest pain?: No  Respiratory Cough?: No Shortness of breath?:  No  Endocrine Excessive thirst?: No  Musculoskeletal Back pain?: No Joint pain?: No  Neurological Headaches?: No Dizziness?: No  Psychologic Depression?: No Anxiety?: No  Physical Exam: BP 114/76   Pulse 76   Ht 5\' 4"  (1.626 m)   Wt 168 lb 14.4 oz (76.6 kg)   BMI 28.99 kg/m   Constitutional:  Alert and oriented, No acute distress.  Presents with husband today. HEENT: [place] AT, moist mucus membranes.  Trachea midline, no masses. Cardiovascular: No clubbing, cyanosis, or edema.Marland Kitchen Respiratory: Normal respiratory effort, no increased work of breathing.   GI: Abdomen is soft, nondistended, no abdominal masses. Mild left lower quadrant pain.   GU: No CVA tenderness.   Skin: No rashes, bruises or suspicious lesions. Neurologic: Grossly intact, no focal deficits, moving all 4 extremities. Psychiatric: Normal mood and affect.  Laboratory Data: Lab Results  Component Value Date   WBC 5.8 01/31/2016   HGB 13.2 01/31/2016   HCT 38.9 01/31/2016   MCV 84.7 01/31/2016   PLT 201 01/31/2016  Lab Results  Component Value Date   CREATININE 0.58 01/31/2016    Pertinent Imaging: CLINICAL DATA:  Initial evaluation for acute left flank pain. History of chronic left pelviectasis. EXAM: RENAL / URINARY TRACT ULTRASOUND COMPLETE COMPARISON:  Prior radiograph from earlier the same day P FINDINGS: Right Kidney: Length: 12.3 cm. Echogenicity within normal limits. No mass or hydronephrosis visualized. Left Kidney: Length: 14.3 cm. Echogenicity within normal limits. No mass identified. Shadowing echogenic calculi visualized, largest measuring 8 mm at the lower pole. Measured Pelviectasis without overt hydronephrosis, overall relatively similar to previous. Bladder: Appears normal for degree of bladder distention. IMPRESSION: 1. Chronic left pelviectasis, overall similar relative to prior ultrasound from 09/14/2015. Bilateral ureteral jets are present. 2. Echogenic foci within the  left kidney measuring up to 8 mm, suspicious for left renal nephrolithiasis. 3. Normal ultrasound of the right kidney. Electronically Signed   By: Rise Mu M.D.   On: 01/31/2016 21:57  CLINICAL DATA:  Left flank pain. Personal history of kidney infection. EXAM: PORTABLE ABDOMEN - 2 VIEW COMPARISON:  Renal ultrasound 09/21/2015. FINDINGS: The bowel gas pattern is normal. There is no evidence of free air. No radio-opaque calculi or other significant radiographic abnormality is seen. IMPRESSION: Negative abdominal radiographs. Electronically Signed   By: Marin Roberts M.D.   On: 01/31/2016 20:40  RUS/ KUB reviewed today personally.  Assessment & Plan:    1. Left flank pain Chronic left flank pain with recurrent stones, history of left empyematous pyelonephritis, and hydronephrosis x many years.  On CT scan, the left kidney appears grossly abnormal with somewhat of an XGP appearance.    Lengthy discussion in past consideration of possible left nephrectomy.  We discussed that it does seem reasonable to consider at this point given long history of pain, infections, and stones.  I continue to have some concerns about removing a functional kidney in a young adult.  Additionally, I have some concerns that her pain may not resolve with nephrectomy.  She did do well with suppressive Bactrim x 1 month with resolution of her pain.  At this point, I had advised her to resume this medication and see if we can control her without the need for nephrectomy with this medication. She is agreeable with this plan.  Also still like her to see pain clinic to help with her chronic pain issues.  2. Hydronephrosis, left As above.  Equivocal for obstruction.  Longstanding without significant loss of function.    3. Recurrent kidney stones Left kidney only  4. Left lower quadrant pain New onset left lower quadrant pain today. Unclear if this is related to nephrolithiasis or possibly  ovarian in etiology. No stones appreciated on recent KUB.  We reviewed warning symptoms. I have advised the patient to give [place] a call within 1 week of her pain fails to resolve at which time we'll consider  low-dose noncontrast CT scan.  Return in about 6 months (around 08/07/2016) for recheck.   Vanna Scotland, MD    [facility] [address], [address] [place], [place] [address] [phone]

## 2016-02-08 NOTE — Telephone Encounter (Signed)
I called the pain clinic and spoke with them regarding her referral. They schd her an appointment with me for 02-11-16 @ 7:40am. I have called the patient and left her a message with the appt date and time and asked her to call me back so that I can confirm that she did get my message. I will continue to call until I get in touch with her.   Thanks,   Marcelino Duster

## 2016-02-10 DIAGNOSIS — Z79891 Long term (current) use of opiate analgesic: Secondary | ICD-10-CM | POA: Insufficient documentation

## 2016-02-10 DIAGNOSIS — F1190 Opioid use, unspecified, uncomplicated: Secondary | ICD-10-CM | POA: Insufficient documentation

## 2016-02-10 DIAGNOSIS — G8929 Other chronic pain: Secondary | ICD-10-CM | POA: Insufficient documentation

## 2016-02-11 ENCOUNTER — Ambulatory Visit
Admission: RE | Admit: 2016-02-11 | Discharge: 2016-02-11 | Disposition: A | Discharge status: Home / Self Care | Payer: BLUE CROSS/BLUE SHIELD | Source: Ambulatory Visit | Attending: Pain Medicine | Admitting: Pain Medicine

## 2016-02-11 ENCOUNTER — Other Ambulatory Visit
Admission: RE | Admit: 2016-02-11 | Discharge: 2016-02-11 | Disposition: A | Discharge status: Home / Self Care | Payer: BLUE CROSS/BLUE SHIELD | Source: Ambulatory Visit | Attending: Pain Medicine | Admitting: Pain Medicine

## 2016-02-11 ENCOUNTER — Ambulatory Visit: Payer: BLUE CROSS/BLUE SHIELD | Attending: Pain Medicine | Admitting: Pain Medicine

## 2016-02-11 ENCOUNTER — Encounter: Payer: Self-pay | Admitting: Pain Medicine

## 2016-02-11 VITALS — BP 120/61 | HR 62 | Temp 98.2°F | Resp 16 | Ht 64.0 in | Wt 165.0 lb

## 2016-02-11 DIAGNOSIS — M4806 Spinal stenosis, lumbar region: Secondary | ICD-10-CM | POA: Diagnosis not present

## 2016-02-11 DIAGNOSIS — N200 Calculus of kidney: Secondary | ICD-10-CM

## 2016-02-11 DIAGNOSIS — F1190 Opioid use, unspecified, uncomplicated: Secondary | ICD-10-CM

## 2016-02-11 DIAGNOSIS — M545 Low back pain, unspecified: Secondary | ICD-10-CM | POA: Insufficient documentation

## 2016-02-11 DIAGNOSIS — N136 Pyonephrosis: Secondary | ICD-10-CM | POA: Insufficient documentation

## 2016-02-11 DIAGNOSIS — D649 Anemia, unspecified: Secondary | ICD-10-CM | POA: Diagnosis not present

## 2016-02-11 DIAGNOSIS — M5136 Other intervertebral disc degeneration, lumbar region: Secondary | ICD-10-CM | POA: Diagnosis not present

## 2016-02-11 DIAGNOSIS — R1031 Right lower quadrant pain: Secondary | ICD-10-CM

## 2016-02-11 DIAGNOSIS — M5126 Other intervertebral disc displacement, lumbar region: Secondary | ICD-10-CM | POA: Insufficient documentation

## 2016-02-11 DIAGNOSIS — K5909 Other constipation: Secondary | ICD-10-CM | POA: Diagnosis not present

## 2016-02-11 DIAGNOSIS — Z79899 Other long term (current) drug therapy: Secondary | ICD-10-CM

## 2016-02-11 DIAGNOSIS — N1330 Unspecified hydronephrosis: Secondary | ICD-10-CM

## 2016-02-11 DIAGNOSIS — E876 Hypokalemia: Secondary | ICD-10-CM | POA: Insufficient documentation

## 2016-02-11 DIAGNOSIS — Z8249 Family history of ischemic heart disease and other diseases of the circulatory system: Secondary | ICD-10-CM | POA: Insufficient documentation

## 2016-02-11 DIAGNOSIS — Z79891 Long term (current) use of opiate analgesic: Secondary | ICD-10-CM | POA: Diagnosis not present

## 2016-02-11 DIAGNOSIS — R1032 Left lower quadrant pain: Secondary | ICD-10-CM | POA: Diagnosis not present

## 2016-02-11 DIAGNOSIS — R1012 Left upper quadrant pain: Secondary | ICD-10-CM

## 2016-02-11 DIAGNOSIS — R195 Other fecal abnormalities: Secondary | ICD-10-CM | POA: Diagnosis not present

## 2016-02-11 DIAGNOSIS — G8929 Other chronic pain: Secondary | ICD-10-CM | POA: Insufficient documentation

## 2016-02-11 DIAGNOSIS — R109 Unspecified abdominal pain: Secondary | ICD-10-CM | POA: Insufficient documentation

## 2016-02-11 DIAGNOSIS — M5430 Sciatica, unspecified side: Secondary | ICD-10-CM | POA: Diagnosis not present

## 2016-02-11 DIAGNOSIS — Z0189 Encounter for other specified special examinations: Secondary | ICD-10-CM | POA: Insufficient documentation

## 2016-02-11 LAB — C-REACTIVE PROTEIN

## 2016-02-11 LAB — COMPREHENSIVE METABOLIC PANEL
ALT: 16 U/L (ref 14–54)
AST: 17 U/L (ref 15–41)
Albumin: 4.4 g/dL (ref 3.5–5.0)
Alkaline Phosphatase: 61 U/L (ref 38–126)
Anion gap: 7 (ref 5–15)
BUN: 10 mg/dL (ref 6–20)
CO2: 27 mmol/L (ref 22–32)
CREATININE: 0.48 mg/dL (ref 0.44–1.00)
Calcium: 8.9 mg/dL (ref 8.9–10.3)
Chloride: 105 mmol/L (ref 101–111)
Glucose, Bld: 97 mg/dL (ref 65–99)
POTASSIUM: 4.1 mmol/L (ref 3.5–5.1)
SODIUM: 139 mmol/L (ref 135–145)
TOTAL PROTEIN: 7.4 g/dL (ref 6.5–8.1)
Total Bilirubin: 0.5 mg/dL (ref 0.3–1.2)

## 2016-02-11 LAB — MAGNESIUM: Magnesium: 1.9 mg/dL (ref 1.7–2.4)

## 2016-02-11 LAB — SEDIMENTATION RATE: Sed Rate: 16 mm/hr (ref 0–20)

## 2016-02-11 LAB — VITAMIN B12: Vitamin B-12: 252 pg/mL (ref 180–914)

## 2016-02-11 LAB — URIC ACID: Uric Acid, Serum: 3.9 mg/dL (ref 2.3–6.6)

## 2016-02-11 NOTE — Patient Instructions (Signed)
GENERAL RISKS AND COMPLICATIONS  What are the risk, side effects and possible complications? Generally speaking, most procedures are safe.  However, with any procedure there are risks, side effects, and the possibility of complications.  The risks and complications are dependent upon the sites that are lesioned, or the type of nerve block to be performed.  The closer the procedure is to the spine, the more serious the risks are.  Great care is taken when placing the radio frequency needles, block needles or lesioning probes, but sometimes complications can occur. 1. Infection: Any time there is an injection through the skin, there is a risk of infection.  This is why sterile conditions are used for these blocks.  There are four possible types of infection. 1. Localized skin infection. 2. Central Nervous System Infection-This can be in the form of Meningitis, which can be deadly. 3. Epidural Infections-This can be in the form of an epidural abscess, which can cause pressure inside of the spine, causing compression of the spinal cord with subsequent paralysis. This would require an emergency surgery to decompress, and there are no guarantees that the patient would recover from the paralysis. 4. Discitis-This is an infection of the intervertebral discs.  It occurs in about 1% of discography procedures.  It is difficult to treat and it may lead to surgery.        2. Pain: the needles have to go through skin and soft tissues, will cause soreness.       3. Damage to internal structures:  The nerves to be lesioned may be near blood vessels or    other nerves which can be potentially damaged.       4. Bleeding: Bleeding is more common if the patient is taking blood thinners such as  aspirin, Coumadin, Ticiid, Plavix, etc., or if he/she have some genetic predisposition  such as hemophilia. Bleeding into the spinal canal can cause compression of the spinal  cord with subsequent paralysis.  This would require an  emergency surgery to  decompress and there are no guarantees that the patient would recover from the  paralysis.       5. Pneumothorax:  Puncturing of a lung is a possibility, every time a needle is introduced in  the area of the chest or upper back.  Pneumothorax refers to free air around the  collapsed lung(s), inside of the thoracic cavity (chest cavity).  Another two possible  complications related to a similar event would include: Hemothorax and Chylothorax.   These are variations of the Pneumothorax, where instead of air around the collapsed  lung(s), you may have blood or chyle, respectively.       6. Spinal headaches: They may occur with any procedures in the area of the spine.       7. Persistent CSF (Cerebro-Spinal Fluid) leakage: This is a rare problem, but may occur  with prolonged intrathecal or epidural catheters either due to the formation of a fistulous  track or a dural tear.       8. Nerve damage: By working so close to the spinal cord, there is always a possibility of  nerve damage, which could be as serious as a permanent spinal cord injury with  paralysis.       9. Death:  Although rare, severe deadly allergic reactions known as "Anaphylactic  reaction" can occur to any of the medications used.      10. Worsening of the symptoms:  We can always make thing worse.    What are the chances of something like this happening? Chances of any of this occuring are extremely low.  By statistics, you have more of a chance of getting killed in a motor vehicle accident: while driving to the hospital than any of the above occurring .  Nevertheless, you should be aware that they are possibilities.  In general, it is similar to taking a shower.  Everybody knows that you can slip, hit your head and get killed.  Does that mean that you should not shower again?  Nevertheless always keep in mind that statistics do not mean anything if you happen to be on the wrong side of them.  Even if a procedure has a 1  (one) in a 1,000,000 (million) chance of going wrong, it you happen to be that one..Also, keep in mind that by statistics, you have more of a chance of having something go wrong when taking medications.  Who should not have this procedure? If you are on a blood thinning medication (e.g. Coumadin, Plavix, see list of "Blood Thinners"), or if you have an active infection going on, you should not have the procedure.  If you are taking any blood thinners, please inform your physician.  How should I prepare for this procedure?  Do not eat or drink anything at least six hours prior to the procedure.  Bring a driver with you .  It cannot be a taxi.  Come accompanied by an adult that can drive you back, and that is strong enough to help you if your legs get weak or numb from the local anesthetic.  Take all of your medicines the morning of the procedure with just enough water to swallow them.  If you have diabetes, make sure that you are scheduled to have your procedure done first thing in the morning, whenever possible.  If you have diabetes, take only half of your insulin dose and notify our nurse that you have done so as soon as you arrive at the clinic.  If you are diabetic, but only take blood sugar pills (oral hypoglycemic), then do not take them on the morning of your procedure.  You may take them after you have had the procedure.  Do not take aspirin or any aspirin-containing medications, at least eleven (11) days prior to the procedure.  They may prolong bleeding.  Wear loose fitting clothing that may be easy to take off and that you would not mind if it got stained with Betadine or blood.  Do not wear any jewelry or perfume  Remove any nail coloring.  It will interfere with some of our monitoring equipment.  NOTE: Remember that this is not meant to be interpreted as a complete list of all possible complications.  Unforeseen problems may occur.  BLOOD THINNERS The following drugs  contain aspirin or other products, which can cause increased bleeding during surgery and should not be taken for 2 weeks prior to and 1 week after surgery.  If you should need take something for relief of minor pain, you may take acetaminophen which is found in Tylenol,m Datril, Anacin-3 and Panadol. It is not blood thinner. The products listed below are.  Do not take any of the products listed below in addition to any listed on your instruction sheet.  A.P.C or A.P.C with Codeine Codeine Phosphate Capsules #3 Ibuprofen Ridaura  ABC compound Congesprin Imuran rimadil  Advil Cope Indocin Robaxisal  Alka-Seltzer Effervescent Pain Reliever and Antacid Coricidin or Coricidin-D  Indomethacin Rufen    Alka-Seltzer plus Cold Medicine Cosprin Ketoprofen S-A-C Tablets  Anacin Analgesic Tablets or Capsules Coumadin Korlgesic Salflex  Anacin Extra Strength Analgesic tablets or capsules CP-2 Tablets Lanoril Salicylate  Anaprox Cuprimine Capsules Levenox Salocol  Anexsia-D Dalteparin Magan Salsalate  Anodynos Darvon compound Magnesium Salicylate Sine-off  Ansaid Dasin Capsules Magsal Sodium Salicylate  Anturane Depen Capsules Marnal Soma  APF Arthritis pain formula Dewitt's Pills Measurin Stanback  Argesic Dia-Gesic Meclofenamic Sulfinpyrazone  Arthritis Bayer Timed Release Aspirin Diclofenac Meclomen Sulindac  Arthritis pain formula Anacin Dicumarol Medipren Supac  Analgesic (Safety coated) Arthralgen Diffunasal Mefanamic Suprofen  Arthritis Strength Bufferin Dihydrocodeine Mepro Compound Suprol  Arthropan liquid Dopirydamole Methcarbomol with Aspirin Synalgos  ASA tablets/Enseals Disalcid Micrainin Tagament  Ascriptin Doan's Midol Talwin  Ascriptin A/D Dolene Mobidin Tanderil  Ascriptin Extra Strength Dolobid Moblgesic Ticlid  Ascriptin with Codeine Doloprin or Doloprin with Codeine Momentum Tolectin  Asperbuf Duoprin Mono-gesic Trendar  Aspergum Duradyne Motrin or Motrin IB Triminicin  Aspirin  plain, buffered or enteric coated Durasal Myochrisine Trigesic  Aspirin Suppositories Easprin Nalfon Trillsate  Aspirin with Codeine Ecotrin Regular or Extra Strength Naprosyn Uracel  Atromid-S Efficin Naproxen Ursinus  Auranofin Capsules Elmiron Neocylate Vanquish  Axotal Emagrin Norgesic Verin  Azathioprine Empirin or Empirin with Codeine Normiflo Vitamin E  Azolid Emprazil Nuprin Voltaren  Bayer Aspirin plain, buffered or children's or timed BC Tablets or powders Encaprin Orgaran Warfarin Sodium  Buff-a-Comp Enoxaparin Orudis Zorpin  Buff-a-Comp with Codeine Equegesic Os-Cal-Gesic   Buffaprin Excedrin plain, buffered or Extra Strength Oxalid   Bufferin Arthritis Strength Feldene Oxphenbutazone   Bufferin plain or Extra Strength Feldene Capsules Oxycodone with Aspirin   Bufferin with Codeine Fenoprofen Fenoprofen Pabalate or Pabalate-SF   Buffets II Flogesic Panagesic   Buffinol plain or Extra Strength Florinal or Florinal with Codeine Panwarfarin   Buf-Tabs Flurbiprofen Penicillamine   Butalbital Compound Four-way cold tablets Penicillin   Butazolidin Fragmin Pepto-Bismol   Carbenicillin Geminisyn Percodan   Carna Arthritis Reliever Geopen Persantine   Carprofen Gold's salt Persistin   Chloramphenicol Goody's Phenylbutazone   Chloromycetin Haltrain Piroxlcam   Clmetidine heparin Plaquenil   Cllnoril Hyco-pap Ponstel   Clofibrate Hydroxy chloroquine Propoxyphen         Before stopping any of these medications, be sure to consult the physician who ordered them.  Some, such as Coumadin (Warfarin) are ordered to prevent or treat serious conditions such as "deep thrombosis", "pumonary embolisms", and other heart problems.  The amount of time that you may need off of the medication may also vary with the medication and the reason for which you were taking it.  If you are taking any of these medications, please make sure you notify your pain physician before you undergo any  procedures.         Celiac Plexus Block Patient Information  Description: The celiac plexus is a group of nerves which are part of the sympathetic nervous system.  These nerves supply organs in the abdomen and pelvis.  Specific organs supplied with sensation by the celiac plexus include the stomach, liver, gallbladder, pancreas, kidneys and part of the gut.   The celiac plexus is located on both sides of the aorta at approximately the level of the first lumbar vertebral body.  The block will be performed with you lying on your abdomen with a pillow underneath.  Using direct x-ray guidance, the celiac plexus will be located on both sides of the spine.  Numbing medicine will be used to deaden the skin prior to needle  insertion.  In most cases, a small amount of sedation can be given by IV prior to the numbing medicine.  Two small needles will be place near the celiac plexus and local anesthetic and steroid will be injected.  The entire block usually last about 15-25 minutes.  Conditions which may be treated by celiac plexus block:   Acute and chronic pancreatitis  Pain from liver or pancreatic cancer  Pain from Crohn's disease  Other types of abdominal or flank pain  Preparation for the injection:  1. Do not eat any solid food or dairy products within 8 hours of your appointment. 2. You may drink clear liquids up to 3 hours before appointment.  Clear liquids include water, black coffee, juice or soda.  No milk or cream please. 3. You may take your regular medication, including pain medications, with a sip of water before your appointment.  Diabetics should hold regular insulin (if taken separately) and take 1/2 normal NPH dose in the morning of the procedure.  Carry some sugar containing items with you to your appointment. 4. A driver must accompany you and be prepared to drive you home after your procedure. 5. Bring all your current medications with you. 6. An IV may be inserted and  sedation may be given at the discretion of the physician. 7. A blood pressure cuff, EKG, and other monitors will often be applied during the procedure.  Some patients may need to have extra oxygen administered for a short period. 8. You will be asked to provide medical information, including your allergies and medications, prior to the procedure.  We must know immediately if you are taking blood thinners (like Coumadin/Warfarin) or if you are allergic to IV iodine contrast (dye).  We must know if you could possible be pregnant.     LABWORK AND XRAYS TO BE DONE TODAY IN THE MEDICAL MALL   Possible side-effects:   Bleeding from needle site or deeper  Infection (rarre, can require surgery)  Nerve injury (rare)  Numbness & tingling (temporary)  Collapsed lung (rare)  Spinal headache ( a headache worse with upright posture)  Light-headedness (temporary)  Pain at injection site (several days)  Decreased blood pressure (temporary)  Weakness in legs (temporary)  Seizure or other drug reaction (rare)  Call if you experience:   Fever/chills associated with headache or increased back/neck pain  Headache worsened by an upright position  New onset weakness or numbness of an extremity below the injection site.  Hives or difficulty breathing (go to the emergency room)  Inflammation or drainage at the injection site.  New onset diarrhea lasting more than 2 weeks.  New symptoms which are concerning to you  Please note:  If effective, we will often do a series of 2-3 injections spaced 3-6 weeks apart to maximally decrease your pain.  If initial series is effective, you may be a candidate for a more permanent block of the celiac plexus. .  If you have questions, please call [phone] [facility]

## 2016-02-11 NOTE — Progress Notes (Signed)
Patient's Name: Katelyn Holland  Patient type: New patient  MRN: [id number]  Service setting: Ambulatory outpatient  DOB: April 24, 1976  Location: [facility]  DOS: 02/11/2016  Primary Care Physician: Filbert Berthold, NP  Note by: Sydnee Levans. Laban Emperor, M.D, DABA, DABAPM, DABPM, Olga Coaster, FIPP  Referring Physician: Vanna Scotland, MD  Specialty: Board-Certified Interventional Pain Management     Primary Reason(s) for Visit: Initial Patient Evaluation CC: Back Pain (left, lower)   HPI  Katelyn Holland is a 40 y.o. year old, female patient, who comes today for an initial evaluation. She has Family history of heart disease in female family member before age 65; Low libido; Emphysematous pyelonephritis; Chronic nephrolithiasis (Left); Chronic constipation; Chronic Hydronephrosis of kidney (Left); UPJ (ureteropelvic junction) obstruction; Hypokalemia; Anemia; Morbid obesity (HCC); Chronic pain; Long term current use of opiate analgesic; Long term prescription opiate use; Opiate use; Chronic low back pain (Location of Primary Source of Pain) (Left); Chronic flank pain (Left); Encounter for pain management planning; and Chronic left lower quadrant pain on her problem list.. Her primarily concern today is the Back Pain (left, lower)   Pain Assessment: Self-Reported Pain Score: 3              Reported level is compatible with observation       Pain Location: Back Pain Orientation: Left, Lower Pain Descriptors / Indicators: Sharp Pain Frequency: Constant  Onset and Duration: Gradual, Date of onset: Before 2005 and Present longer than 3 months Cause of pain: Kidney stones Severity: Getting worse Timing: Not influenced by the time of the day Aggravating Factors: Not influenced by bending, bowel movements, climbing, eating, intercourse, kneeling, lifting, motion, nerve blocks, prolonged sitting, prolonged standing, squatting, stooping, surgery, twisting, walking, walking uphill, walking  downhill, or working. Alleviating Factors: Not alleviated by acupuncture, bending, biofeedback, stretching, cold packs, hot packs, hypnosis, laying down, taking medications, nerve blocks, resting, sitting, sleeping, standing, using a TENS unit, using a brace, relaxation therapy, physical therapy, warm showers or baths, chiropractic manipulations, or walking. Associated Problems: Pain that wakes patient up and Pain that does not allow patient to sleep Quality of Pain: Sharp Previous Examinations or Tests: CT scan and X-rays Previous Treatments: The patient denies Any biofeedback, chiropractic manipulations, cryo-analgesia, epidural steroid injections, facet blocks, hypnotherapy, morphine pump, narcotic medications, physical therapy, pool exercises, radiofrequency, relaxation therapy, spinal cord stimulation, steroid treatments by mouth, stretching exercises, the use of a TENS unit, traction, or trigger point injections.  The patient comes into the clinics today for the first time for a chronic pain management evaluation. Evaluation of the patient's questionnaire reveals that she took furlough time to think or answer the questions that we asked her. According to the patient that primary pain is in the left lower back and left flank going into the left lower abdominal quadrant. She knows that it is secondary to kidney stones and she indicates that is started after Dr. Cindee Lame removed a large kidney stone out of her left kidney. However, of interest is the fact that she has evidence of degenerative disc disease at the L4-5 level on the right side with a prior history of 2 back surgeries. One other back surgeries was done around 1997-8 by Dr. Ruthann Cancer at [facility] and the other surgery was done around 2014 in [facility] by Dr. Armen Pickup.  The patient indicates that she is really not interested in using opioids. However her PMP (Prescription Monitoring Program) reveals that she has been using some form  of  opioids on an intermittent basis since before 2011. Some of the opioids that she has used in the past have included oxycodone/APAP 5/325, hydrocodone/APAP 5/500, hydrocodone/APAP 5/325, Endocet 10/325, oxycodone IR 10 mg up to 4 times a day, oxycodone/APAP 10/325, oxycodone/APAP 7.5/325, tramadol 50 mg, hydrocodone/APAP 7.5/325, and hydrocodone/APAP 5/300.  Historic Controlled Substance Pharmacotherapy Review  Previously Prescribed Opioids: Some of the opioids that she has used in the past have included oxycodone/APAP 5/325, hydrocodone/APAP 5/500, hydrocodone/APAP 5/325, Endocet 10/325, oxycodone IR 10 mg up to 4 times a day, oxycodone/APAP 10/325, oxycodone/APAP 7.5/325, tramadol 50 mg, hydrocodone/APAP 7.5/325, and hydrocodone/APAP 5/300. Currently Prescribed Analgesic: The last prescription recorded on the PMP was tramadol 50 mg #20 to be taken one every 6 hours, failed on 02/01/2016. Medications: The patient did not bring the medication(s) to the appointment, as requested in our "New Patient Package" MME/day: 20 mg/day Pharmacodynamics: Analgesic Effect: More than 50% Activity Facilitation: Medication(s) allow patient to sit, stand, walk, and do the basic ADLs Perceived Effectiveness: Described as relatively effective, allowing for increase in activities of daily living (ADL) Side-effects or Adverse reactions: None reported Historical Background Evaluation: [place] PDMP: Five (5) year initial data search conducted. No abnormal patterns identified [place] [employer] Offender Public Information: Non-contributory UDS Results: No UDS results available at this time UDS Interpretation: N/A Medication Assessment Form: Not applicable. Initial evaluation. The patient has not received any medications from our practice Treatment compliance: Not applicable. Initial evaluation Risk Assessment: Aberrant Behavior: None observed or detected today Opioid Fatal Overdose Risk Factors: None identified  today Non-fatal overdose hazard ratio (HR): Calculation deferred Fatal overdose hazard ratio (HR): Calculation deferred Substance Use Disorder (SUD) Risk Level: Pending results of Medical Psychology Evaluation for SUD Opioid Risk Tool (ORT) Score: Total Score: 1 Low Risk for SUD (Score <3) Depression Scale Score: PHQ-2: PHQ-2 Total Score: 0 No depression (0) PHQ-9: PHQ-9 Total Score: 0 No depression (0-4)  Pharmacologic Plan: Pending ordered tests and/or consults  Historical Illicit Drug Screen Labs(s): No results found for: MDMA, COCAINSCRNUR, PCPSCRNUR, THCU, ETH  Meds  The patient has a current medication list which includes the following prescription(s): sulfamethoxazole-trimethoprim.  Current Outpatient Prescriptions on File Prior to Visit  Medication Sig  . sulfamethoxazole-trimethoprim (BACTRIM) 400-80 MG tablet Take 1 tablet by mouth daily.   No current facility-administered medications on file prior to visit.     Imaging Review  Lumbosacral Imaging: Lumbar MR wo contrast:  Results for orders placed in visit on 12/29/10  MR L Spine Ltd W/O Cm   Narrative * PRIOR REPORT IMPORTED FROM AN EXTERNAL SYSTEM *   PRIOR REPORT IMPORTED FROM THE [facility] WORKFLOW SYSTEM   REASON FOR EXAM:    Lower back pain w/ Sciatica.  Please Evaluate.  COMMENTS:   PROCEDURE:     MR  - MR LUMBAR SPINE WO CONTRAST  - Dec 30 2010  2:23PM   RESULT:     MRI LUMBAR SPINE WITHOUT CONTRAST   HISTORY: Low back pain   COMPARISON: None   TECHNIQUE: Multiplanar and multisequence MRI of the lumbar spine were  obtained, without administration of IV contrast.   FINDINGS:   The vertebral bodies of the lumbar spine are normal in size and alignment.  There is normal bone marrow signal demonstrated throughout the vertebra.  The  intervertebral disc spaces are well-maintained.   The spinal cord is of normal volume and contour. The cord terminates  normally at L1 . The nerve roots of the  cauda equina  and the filum  terminale  have the usual appearance.   The visualized portions of the SI joints are unremarkable.   The imaged intra-abdominal contents are unremarkable.   T12-L1: No significant disc bulge. No evidence of neural foraminal or  central stenosis.   L1-L2: No significant disc bulge. No evidence of neural foraminal or  central  stenosis.   L2-L3: No significant disc bulge. No evidence of neural foraminal or  central  stenosis.   L3-L4: Mild broad-based disc bulge. Mild bilateral facet arthropathy. No  evidence of neural foraminal or central stenosis.   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 There is no  foraminal stenosis.   L5-S1: No significant disc bulge. No evidence of neural foraminal or  central  stenosis.   IMPRESSION:   1. At L4-L5 there is a large central disc protrusion eccentric towards the  right with mass effect upon the intrathecal nerve roots which are  posteriorly displaced and resulting in severe central canal stenosis.       Lumbar DG 2-3 views:  Results for orders placed in visit on 06/09/13  DG Lumbar Spine 2-3 Views   Narrative * PRIOR REPORT IMPORTED FROM AN EXTERNAL SYSTEM *   CLINICAL DATA:  Low back pain   EXAM:  LUMBAR SPINE - 2-3 VIEW   COMPARISON:  12/30/2010   FINDINGS:  Innumerable calculi project over the lower pole of the left kidney.  Mild degenerative change of the SI joints.   Anatomic alignment. Moderate narrowing of the L4-5 disc with  posterior disc osteophytes. No vertebral compression deformity.   IMPRESSION:  No acute bony pathology.    Electronically Signed    By: Maryclare Bean M.D.    On: 06/09/2013 18:05       Note: Imaging results reviewed.  ROS  Cardiovascular History: Negative for hypertension, coronary artery diseas,  myocardial infraction, anticoagulant therapy or heart failure Pulmonary or Respiratory History: Negative for bronchial asthma, emphysema, chronic smoking, chronic bronchitis, sarcoidosis, tuberculosis or sleep apena Neurological History: Negative for epilepsy, stroke, urinary or fecal inontinence, spina bifida or tethered cord syndrome Review of Past Neurological Studies: No results found for this or any previous visit. Psychological-Psychiatric History: Negative for anxiety, depression, schizophrenia, bipolar disorders or suicidal ideations or attempts Gastrointestinal History: Negative for peptic ulcer disease, hiatal hernia, GERD, IBS, hepatitis, cirrhosis or pancreatitis Genitourinary History: Kidney disease, Nephrolithiasis, Hematuria and Recurrent Urinary Tract infections Hematological History: Negative for anticoagulant therapy, anemia, bruising or bleeding easily, hemophilia, sickle cell disease or trait, thrombocytopenia or coagulupathies Endocrine History: Negative for diabetes or thyroid disease Rheumatologic History: Negative for lupus, osteoarthritis, rheumatoid arthritis, myositis, polymyositis or fibromyagia Musculoskeletal History: Negative for myasthenia gravis, muscular dystrophy, multiple sclerosis or malignant hyperthermia Work History: Working full time  Allergies  Katelyn Holland is allergic to dilaudid  [hydromorphone hcl] and iodinated diagnostic agents.  Laboratory Chemistry  Inflammation Markers Lab Results  Component Value Date   ESRSEDRATE 16 02/11/2016   CRP <0.5 02/11/2016    Renal Function Lab Results  Component Value Date   BUN 10 02/11/2016   CREATININE 0.48 02/11/2016   GFRAA >60 02/11/2016   GFRNONAA >60 02/11/2016    Hepatic Function Lab Results  Component Value Date   AST 17 02/11/2016   ALT 16 02/11/2016   ALBUMIN 4.4 02/11/2016  Electrolytes Lab Results  Component Value Date   NA 139 02/11/2016   K 4.1 02/11/2016   CL 105 02/11/2016    CALCIUM 8.9 02/11/2016   MG 1.9 02/11/2016    Pain Modulating Vitamins Lab Results  Component Value Date   VITAMINB12 252 02/11/2016    Coagulation Parameters Lab Results  Component Value Date   INR 1.10 07/08/2015   LABPROT 14.4 07/08/2015   PLT 201 01/31/2016    Cardiovascular Lab Results  Component Value Date   HGB 13.2 01/31/2016   HCT 38.9 01/31/2016    Note: Lab results reviewed.  PFSH  Medical:  Katelyn Holland  has a past medical history of Arthritis; Chronic constipation; Gravida 1 para 1; Kidney stone; and Kidney stones. Family: family history includes Cancer in her maternal grandmother; Heart disease in her father; Heart disease (age of onset: 1) in her mother. Surgical:  has a past surgical history that includes Abdominal hysterectomy; Laparoscopic gastric sleeve resection (2016); Lithotripsy (2007); Appendectomy; Back surgery; Kidney stone surgery (Left, 2000); and Nephrolithotomy (Left, 07/23/2015). Tobacco:  reports that she has never smoked. She has never used smokeless tobacco. Alcohol:  reports that she drinks alcohol. Drug:  reports that she does not use drugs. Active Ambulatory Problems    Diagnosis Date Noted  . Family history of heart disease in female family member before age 38 12/24/2014  . Low libido 12/24/2014  . 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 Chronic nephrolithiasis (Left) 07/07/2015  . Chronic constipation 07/07/2015  . Chronic Hydronephrosis of kidney (Left) 07/09/2015  . 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 Hypokalemia 07/09/2015  . Anemia 07/09/2015  . Morbid obesity (HCC) 04/10/2014  . Chronic pain 02/10/2016  . Long term current use of opiate analgesic 02/10/2016  . Long term prescription opiate use 02/10/2016  . Opiate use 02/10/2016  . Chronic low back pain (Location of Primary Source of Pain) (Left) 02/11/2016  . Chronic flank pain (Left) 02/11/2016  . Encounter for pain management planning 02/11/2016   . Chronic left lower quadrant pain 02/11/2016   Resolved Ambulatory Problems    Diagnosis Date Noted  . Extreme obesity (HCC) 04/10/2014   Past Medical History:  Diagnosis Date  . Arthritis   . Chronic constipation   . Gravida 1 para 1   . Kidney stone   . Kidney stones     Constitutional Exam  Vitals: Blood pressure 120/61, pulse 62, temperature 98.2 F (36.8 C), temperature source Oral, resp. rate 16, height 5\' 4"  (1.626 m), weight 165 lb (74.8 kg), SpO2 100 %. General appearance: Well nourished, well developed, and well hydrated. In no acute distress Calculated BMI/Body habitus: Body mass index is 28.32 kg/m. (25-29.9 kg/m2) Overweight - 20% higher incidence of chronic pain Psych/Mental status: Alert and oriented x 3 (person, place, & time) Eyes: PERLA Respiratory: No evidence of acute respiratory distress  Cervical Spine Exam  Inspection: No masses, redness, or swelling Alignment: Symmetrical Functional ROM: ROM appears unrestricted Stability: No instability detected Muscle strength & Tone: Functionally intact Sensory: Unimpaired Palpation: Non-contributory  Upper Extremity (UE) Exam    Side: Right upper extremity  Side: Left upper extremity  Inspection: No masses, redness, swelling, or asymmetry  Inspection: No masses, redness, swelling, or asymmetry  Functional ROM: ROM appears unrestricted  Functional ROM: ROM appears unrestricted  Muscle strength & Tone: Functionally intact  Muscle strength & Tone: Functionally intact  Sensory: Unimpaired  Sensory: Unimpaired  Palpation: Non-contributory  Palpation: Non-contributory   Thoracic Spine Exam  Inspection: No masses, redness, or swelling Alignment: Symmetrical Functional ROM: ROM appears unrestricted Stability: No instability detected Sensory: Unimpaired Muscle strength & Tone: Functionally intact Palpation: Non-contributory  Lumbar Spine Exam  Inspection: Well healed scar from previous spine surgery  detected Alignment: Symmetrical Functional ROM: ROM appears unrestricted Stability: No instability detected Muscle strength & Tone: Functionally intact Sensory: Unimpaired Palpation: Non-contributory Provocative Tests: Lumbar Hyperextension and rotation test: evaluation deferred today       Patrick's Maneuver: evaluation deferred today              Gait & Posture Assessment  Ambulation: Unassisted Gait: Relatively normal for age and body habitus Posture: WNL   Lower Extremity Exam    Side: Right lower extremity  Side: Left lower extremity  Inspection: No masses, redness, swelling, or asymmetry  Inspection: No masses, redness, swelling, or asymmetry  Functional ROM: ROM appears unrestricted  Functional ROM: ROM appears unrestricted  Muscle strength & Tone: Functionally intact  Muscle strength & Tone: Functionally intact  Sensory: Unimpaired  Sensory: Unimpaired  Palpation: Non-contributory  Palpation: Non-contributory    Assessment  Primary Diagnosis & Pertinent Problem List: The primary encounter diagnosis was Chronic Hydronephrosis of left kidney. Diagnoses of Chronic pain, Long term current use of opiate analgesic, Long term prescription opiate use, Opiate use, Chronic low back pain (Location of Primary Source of Pain) (Left), Chronic nephrolithiasis (Left), Chronic flank pain (Left), Encounter for pain management planning, and Chronic left lower quadrant pain were also pertinent to this visit.  Visit Diagnosis: 1. Chronic Hydronephrosis of left kidney   2. Chronic pain   3. Long term current use of opiate analgesic   4. Long term prescription opiate use   5. Opiate use   6. Chronic low back pain (Location of Primary Source of Pain) (Left)   7. Chronic nephrolithiasis (Left)   8. Chronic flank pain (Left)   9. Encounter for pain management planning   10. Chronic left lower quadrant pain     Assessment: No problem-specific Assessment & Plan notes found for this  encounter.   Plan of Care  Initial Treatment Plan:  Please be advised that as per protocol, today's visit has been an evaluation only. We have not taken over the patient's controlled substance management.  Problem List Items Addressed This Visit      High   Chronic flank pain (Left) (Chronic)   Chronic Hydronephrosis of kidney (Left) - Primary (Chronic)   Relevant Orders   DG Abd 1 View (Completed)   Chronic left lower quadrant pain (Chronic)   Chronic low back pain (Location of Primary Source of Pain) (Left) (Chronic)   Relevant Orders   DG Lumbar Spine Complete W/Bend (Completed)   Chronic nephrolithiasis (Left) (Chronic)   Relevant Orders   DG Abd 1 View (Completed)   Uric acid (Completed)   Uric acid, random urine   Chronic pain (Chronic)   Relevant Orders   Comprehensive metabolic panel (Completed)   C-reactive protein (Completed)   Magnesium (Completed)   Sedimentation rate (Completed)   Vitamin B12 (Completed)   25-Hydroxyvitamin D Lcms D2+D3   Ambulatory referral to Psychology     Medium   Encounter for pain management planning   Long term current use of opiate analgesic (Chronic)   Relevant Orders   Compliance Drug Analysis, Ur   Ambulatory referral to Psychology   Long term prescription opiate use (Chronic)   Opiate use (Chronic)    Other Visit Diagnoses   None.  Pharmacotherapy (Medications Ordered): No orders of the defined types were placed in this encounter.   Lab-work & Procedure Ordered: Orders Placed This Encounter  Procedures  . DG Lumbar Spine Complete W/Bend  . DG Abd 1 View  . Compliance Drug Analysis, Ur  . Comprehensive metabolic panel  . C-reactive protein  . Magnesium  . Sedimentation rate  . Vitamin B12  . 25-Hydroxyvitamin D Lcms D2+D3  . Uric acid  . Uric acid, random urine  . Ambulatory referral to Psychology    Imaging Ordered: AMB REFERRAL TO PSYCHOLOGY  Interventional Therapies: Scheduled:  None at this time.     Considering:  Possible celiac plexus block under fluoroscopic guidance and IV sedation.    PRN Procedures:  None at this time.    Referral(s) or Consult(s): Medical psychology consult for substance use disorder evaluation  Medications administered during this visit: Katelyn Holland had no medications administered during this visit.  Prescriptions ordered during this visit: New Prescriptions   No medications on file    Requested PM Follow-up: Return for After MedPsych Eval.  Future Appointments Date Time Provider Department Center  02/12/2016 3:00 PM Amy Rusty Aus, NP [facility] None     Primary Care Physician: Filbert Berthold, NP Location: [facility] Note by: Sydnee Levans. Laban Emperor, M.D, DABA, DABAPM, DABPM, DABIPP, FIPP  Pain Score Disclaimer: We use the NRS-11 scale. This is a self-reported, subjective measurement of pain severity with only modest accuracy. It is used primarily to identify changes within a particular patient. It must be understood that outpatient pain scales are significantly less accurate that those used for research, where they can be applied under ideal controlled circumstances with minimal exposure to variables. In reality, the score is likely to be a combination of pain intensity and pain affect, where pain affect describes the degree of emotional arousal or changes in action readiness caused by the sensory experience of pain. Factors such as social and work situation, setting, emotional state, anxiety levels, expectation, and prior pain experience may influence pain perception and show large inter-individual differences that may also be affected by time variables.  Patient instructions provided during this appointment: Patient Instructions   GENERAL RISKS AND COMPLICATIONS  What are the risk, side effects and possible complications? Generally speaking, most procedures are safe.  However, with any procedure there are risks, side  effects, and the possibility of complications.  The risks and complications are dependent upon the sites that are lesioned, or the type of nerve block to be performed.  The closer the procedure is to the spine, the more serious the risks are.  Great care is taken when placing the radio frequency needles, block needles or lesioning probes, but sometimes complications can occur. 1. Infection: Any time there is an injection through the skin, there is a risk of infection.  This is why sterile conditions are used for these blocks.  There are four possible types of infection. 1. Localized skin infection. 2. Central Nervous System Infection-This can be in the form of Meningitis, which can be deadly. 3. Epidural Infections-This can be in the form of an epidural abscess, which can cause pressure inside of the spine, causing compression of the spinal cord with subsequent paralysis. This would require an emergency surgery to decompress, and there are no guarantees that the patient would recover from the paralysis. 4. Discitis-This is an infection of the intervertebral discs.  It occurs in about 1% of discography procedures.  It is difficult to  treat and it may lead to surgery.        2. Pain: the needles have to go through skin and soft tissues, will cause soreness.       3. Damage to internal structures:  The nerves to be lesioned may be near blood vessels or    other nerves which can be potentially damaged.       4. Bleeding: Bleeding is more common if the patient is taking blood thinners such as  aspirin, Coumadin, Ticiid, Plavix, etc., or if Holland/she have some genetic predisposition  such as hemophilia. Bleeding into the spinal canal can cause compression of the spinal  cord with subsequent paralysis.  This would require an emergency surgery to  decompress and there are no guarantees that the patient would recover from the  paralysis.       5. Pneumothorax:  Puncturing of a lung is a possibility, every time a  needle is introduced in  the area of the chest or upper back.  Pneumothorax refers to free air around the  collapsed lung(s), inside of the thoracic cavity (chest cavity).  Another two possible  complications related to a similar event would include: Hemothorax and Chylothorax.   These are variations of the Pneumothorax, where instead of air around the collapsed  lung(s), you may have blood or chyle, respectively.       6. Spinal headaches: They may occur with any procedures in the area of the spine.       7. Persistent CSF (Cerebro-Spinal Fluid) leakage: This is a rare problem, but may occur  with prolonged intrathecal or epidural catheters either due to the formation of a fistulous  track or a dural tear.       8. Nerve damage: By working so close to the spinal cord, there is always a possibility of  nerve damage, which could be as serious as a permanent spinal cord injury with  paralysis.       9. Death:  Although rare, severe deadly allergic reactions known as "Anaphylactic  reaction" can occur to any of the medications used.      10. Worsening of the symptoms:  We can always make thing worse.  What are the chances of something like this happening? Chances of any of this occuring are extremely low.  By statistics, you have more of a chance of getting killed in a motor vehicle accident: while driving to the hospital than any of the above occurring .  Nevertheless, you should be aware that they are possibilities.  In general, it is similar to taking a shower.  Everybody knows that you can slip, hit your head and get killed.  Does that mean that you should not shower again?  Nevertheless always keep in mind that statistics do not mean anything if you happen to be on the wrong side of them.  Even if a procedure has a 1 (one) in a 1,000,000 (million) chance of going wrong, it you happen to be that one..Also, keep in mind that by statistics, you have more of a chance of having something go wrong when taking  medications.  Who should not have this procedure? If you are on a blood thinning medication (e.g. Coumadin, Plavix, see list of "Blood Thinners"), or if you have an active infection going on, you should not have the procedure.  If you are taking any blood thinners, please inform your physician.  How should I prepare for this procedure?  Do not eat or  drink anything at least six hours prior to the procedure.  Bring a driver with you .  It cannot be a taxi.  Come accompanied by an adult that can drive you back, and that is strong enough to help you if your legs get weak or numb from the local anesthetic.  Take all of your medicines the morning of the procedure with just enough water to swallow them.  If you have diabetes, make sure that you are scheduled to have your procedure done first thing in the morning, whenever possible.  If you have diabetes, take only half of your insulin dose and notify our nurse that you have done so as soon as you arrive at the clinic.  If you are diabetic, but only take blood sugar pills (oral hypoglycemic), then do not take them on the morning of your procedure.  You may take them after you have had the procedure.  Do not take aspirin or any aspirin-containing medications, at least eleven (11) days prior to the procedure.  They may prolong bleeding.  Wear loose fitting clothing that may be easy to take off and that you would not mind if it got stained with Betadine or blood.  Do not wear any jewelry or perfume  Remove any nail coloring.  It will interfere with some of our monitoring equipment.  NOTE: Remember that this is not meant to be interpreted as a complete list of all possible complications.  Unforeseen problems may occur.  BLOOD THINNERS The following drugs contain aspirin or other products, which can cause increased bleeding during surgery and should not be taken for 2 weeks prior to and 1 week after surgery.  If you should need take something for  relief of minor pain, you may take acetaminophen which is found in Tylenol,m Datril, Anacin-3 and Panadol. It is not blood thinner. The products listed below are.  Do not take any of the products listed below in addition to any listed on your instruction sheet.  A.P.C or A.P.C with Codeine Codeine Phosphate Capsules #3 Ibuprofen Ridaura  ABC compound Congesprin Imuran rimadil  Advil Cope Indocin Robaxisal  Alka-Seltzer Effervescent Pain Reliever and Antacid Coricidin or Coricidin-D  Indomethacin Rufen  Alka-Seltzer plus Cold Medicine Cosprin Ketoprofen S-A-C Tablets  Anacin Analgesic Tablets or Capsules Coumadin Korlgesic Salflex  Anacin Extra Strength Analgesic tablets or capsules CP-2 Tablets Lanoril Salicylate  Anaprox Cuprimine Capsules Levenox Salocol  Anexsia-D Dalteparin Magan Salsalate  Anodynos Darvon compound Magnesium Salicylate Sine-off  Ansaid Dasin Capsules Magsal Sodium Salicylate  Anturane Depen Capsules Marnal Soma  APF Arthritis pain formula Dewitt's Pills Measurin Stanback  Argesic Dia-Gesic Meclofenamic Sulfinpyrazone  Arthritis Bayer Timed Release Aspirin Diclofenac Meclomen Sulindac  Arthritis pain formula Anacin Dicumarol Medipren Supac  Analgesic (Safety coated) Arthralgen Diffunasal Mefanamic Suprofen  Arthritis Strength Bufferin Dihydrocodeine Mepro Compound Suprol  Arthropan liquid Dopirydamole Methcarbomol with Aspirin Synalgos  ASA tablets/Enseals Disalcid Micrainin Tagament  Ascriptin Doan's Midol Talwin  Ascriptin A/D Dolene Mobidin Tanderil  Ascriptin Extra Strength Dolobid Moblgesic Ticlid  Ascriptin with Codeine Doloprin or Doloprin with Codeine Momentum Tolectin  Asperbuf Duoprin Mono-gesic Trendar  Aspergum Duradyne Motrin or Motrin IB Triminicin  Aspirin plain, buffered or enteric coated Durasal Myochrisine Trigesic  Aspirin Suppositories Easprin Nalfon Trillsate  Aspirin with Codeine Ecotrin Regular or Extra Strength Naprosyn Uracel  Atromid-S  Efficin Naproxen Ursinus  Auranofin Capsules Elmiron Neocylate Vanquish  Axotal Emagrin Norgesic Verin  Azathioprine Empirin or Empirin with Codeine Normiflo Vitamin E  Azolid Emprazil Nuprin  Voltaren  Bayer Aspirin plain, buffered or children's or timed BC Tablets or powders Encaprin Orgaran Warfarin Sodium  Buff-a-Comp Enoxaparin Orudis Zorpin  Buff-a-Comp with Codeine Equegesic Os-Cal-Gesic   Buffaprin Excedrin plain, buffered or Extra Strength Oxalid   Bufferin Arthritis Strength Feldene Oxphenbutazone   Bufferin plain or Extra Strength Feldene Capsules Oxycodone with Aspirin   Bufferin with Codeine Fenoprofen Fenoprofen Pabalate or Pabalate-SF   Buffets II Flogesic Panagesic   Buffinol plain or Extra Strength Florinal or Florinal with Codeine Panwarfarin   Buf-Tabs Flurbiprofen Penicillamine   Butalbital Compound Four-way cold tablets Penicillin   Butazolidin Fragmin Pepto-Bismol   Carbenicillin Geminisyn Percodan   Carna Arthritis Reliever Geopen Persantine   Carprofen Gold's salt Persistin   Chloramphenicol Goody's Phenylbutazone   Chloromycetin Haltrain Piroxlcam   Clmetidine heparin Plaquenil   Cllnoril Hyco-pap Ponstel   Clofibrate Hydroxy chloroquine Propoxyphen         Before stopping any of these medications, be sure to consult the physician who ordered them.  Some, such as Coumadin (Warfarin) are ordered to prevent or treat serious conditions such as "deep thrombosis", "pumonary embolisms", and other heart problems.  The amount of time that you may need off of the medication may also vary with the medication and the reason for which you were taking it.  If you are taking any of these medications, please make sure you notify your pain physician before you undergo any procedures.         Celiac Plexus Block Patient Information  Description: The celiac plexus is a group of nerves which are part of the sympathetic nervous system.  These nerves supply organs in the  abdomen and pelvis.  Specific organs supplied with sensation by the celiac plexus include the stomach, liver, gallbladder, pancreas, kidneys and part of the gut.   The celiac plexus is located on both sides of the aorta at approximately the level of the first lumbar vertebral body.  The block will be performed with you lying on your abdomen with a pillow underneath.  Using direct x-ray guidance, the celiac plexus will be located on both sides of the spine.  Numbing medicine will be used to deaden the skin prior to needle insertion.  In most cases, a small amount of sedation can be given by IV prior to the numbing medicine.  Two small needles will be place near the celiac plexus and local anesthetic and steroid will be injected.  The entire block usually last about 15-25 minutes.  Conditions which may be treated by celiac plexus block:   Acute and chronic pancreatitis  Pain from liver or pancreatic cancer  Pain from Crohn's disease  Other types of abdominal or flank pain  Preparation for the injection:  1. Do not eat any solid food or dairy products within 8 hours of your appointment. 2. You may drink clear liquids up to 3 hours before appointment.  Clear liquids include water, black coffee, juice or soda.  No milk or cream please. 3. You may take your regular medication, including pain medications, with a sip of water before your appointment.  Diabetics should hold regular insulin (if taken separately) and take 1/2 normal NPH dose in the morning of the procedure.  Carry some sugar containing items with you to your appointment. 4. A driver must accompany you and be prepared to drive you home after your procedure. 5. Bring all your current medications with you. 6. An IV may be inserted and sedation may be given at the  discretion of the physician. 7. A blood pressure cuff, EKG, and other monitors will often be applied during the procedure.  Some patients may need to have extra oxygen administered  for a short period. 8. You will be asked to provide medical information, including your allergies and medications, prior to the procedure.  We must know immediately if you are taking blood thinners (like Coumadin/Warfarin) or if you are allergic to IV iodine contrast (dye).  We must know if you could possible be pregnant.     LABWORK AND XRAYS TO BE DONE TODAY IN THE MEDICAL MALL   Possible side-effects:   Bleeding from needle site or deeper  Infection (rarre, can require surgery)  Nerve injury (rare)  Numbness & tingling (temporary)  Collapsed lung (rare)  Spinal headache ( a headache worse with upright posture)  Light-headedness (temporary)  Pain at injection site (several days)  Decreased blood pressure (temporary)  Weakness in legs (temporary)  Seizure or other drug reaction (rare)  Call if you experience:   Fever/chills associated with headache or increased back/neck pain  Headache worsened by an upright position  New onset weakness or numbness of an extremity below the injection site.  Hives or difficulty breathing (go to the emergency room)  Inflammation or drainage at the injection site.  New onset diarrhea lasting more than 2 weeks.  New symptoms which are concerning to you  Please note:  If effective, we will often do a series of 2-3 injections spaced 3-6 weeks apart to maximally decrease your pain.  If initial series is effective, you may be a candidate for a more permanent block of the celiac plexus. .  If you have questions, please call [phone] [facility]

## 2016-02-12 ENCOUNTER — Other Ambulatory Visit: Payer: Self-pay | Admitting: Family Medicine

## 2016-02-12 ENCOUNTER — Ambulatory Visit (INDEPENDENT_AMBULATORY_CARE_PROVIDER_SITE_OTHER): Payer: BLUE CROSS/BLUE SHIELD | Admitting: Family Medicine

## 2016-02-12 VITALS — BP 108/62 | HR 63 | Temp 98.3°F | Resp 16 | Ht 64.0 in | Wt 162.0 lb

## 2016-02-12 DIAGNOSIS — Z124 Encounter for screening for malignant neoplasm of cervix: Secondary | ICD-10-CM

## 2016-02-12 DIAGNOSIS — G4762 Sleep related leg cramps: Secondary | ICD-10-CM | POA: Insufficient documentation

## 2016-02-12 DIAGNOSIS — Z0000 Encounter for general adult medical examination without abnormal findings: Secondary | ICD-10-CM

## 2016-02-12 DIAGNOSIS — G2581 Restless legs syndrome: Secondary | ICD-10-CM

## 2016-02-12 DIAGNOSIS — B373 Candidiasis of vulva and vagina: Secondary | ICD-10-CM

## 2016-02-12 DIAGNOSIS — Z1239 Encounter for other screening for malignant neoplasm of breast: Secondary | ICD-10-CM

## 2016-02-12 DIAGNOSIS — B3731 Acute candidiasis of vulva and vagina: Secondary | ICD-10-CM

## 2016-02-12 DIAGNOSIS — Z01419 Encounter for gynecological examination (general) (routine) without abnormal findings: Secondary | ICD-10-CM

## 2016-02-12 LAB — LIPID PANEL
CHOLESTEROL: 175 mg/dL (ref 125–200)
HDL: 48 mg/dL (ref >=46)
LDL Cholesterol: 103 mg/dL (ref <130)
Total CHOL/HDL Ratio: 3.6 Ratio (ref <=5.0)
Triglycerides: 121 mg/dL (ref <150)
VLDL: 24 mg/dL (ref <30)

## 2016-02-12 LAB — FERRITIN: FERRITIN: 57 ng/mL (ref 10–232)

## 2016-02-12 LAB — POCT WET PREP (WET MOUNT)
Clue Cells Wet Prep Whiff POC: NEGATIVE
Trichomonas Wet Prep HPF POC: ABSENT

## 2016-02-12 LAB — URIC ACID, RANDOM URINE: URIC ACID, URINE: 63.5 mg/dL

## 2016-02-12 MED ORDER — FLUCONAZOLE 150 MG PO TABS: 150.0000 mg | ORAL_TABLET | Freq: Once | ORAL | 1 refills | Status: AC

## 2016-02-12 MED ORDER — CYCLOBENZAPRINE HCL 10 MG PO TABS: 10.0000 mg | ORAL_TABLET | Freq: Every day | ORAL | 5 refills | Status: DC

## 2016-02-12 NOTE — Progress Notes (Signed)
Subjective:    Patient ID: Katelyn Holland, female    DOB: 1976-03-29, 39 y.o.   MRN: [id number]  HPI: Katelyn Holland is a 40 y.o. female presenting on 02/12/2016 for Gynecologic Exam (yeast infection needed [facility] appointment pt had complete hysterectomy )   HPI  Pt presents CPE today. Recent issues with infected kidney stone. Taking chronic bactrim for her kidney stone. Also saw Dr. Laban Emperor  For chronic pain management from L flank.  Gastric banding in 2015- doing well. Weight is stable 160's.  Concern about yeast infection today.  Having cheesy discharge.  Requesting flexeril for muscle cramping at night. Has been helped. Gets a creepy crawly sensation in her legs- must walk to get rid of it. No problems when she takes flexeril.   Aunt with breast cancer- age 47 or greater.   Past Medical History:  Diagnosis Date  . Arthritis   . Chronic constipation   . Gravida 1 para 1   . Kidney stone   . Kidney stones    Social History   Social History  . Marital status: Married    Spouse name: N/A  . Number of children: N/A  . Years of education: N/A   Occupational History  . Not on file.   Social History Main Topics  . Smoking status: Never Smoker  . Smokeless tobacco: Never Used  . Alcohol use 0.0 - 0.6 oz/week     Comment: occasional 1 every couple of months  . Drug use: No  . Sexual activity: Not on file   Other Topics Concern  . Not on file   Social History Narrative  . No narrative on file   Family History  Problem Relation Age of Onset  . Heart disease Mother 75  . Cancer Maternal Grandmother   . Heart disease Father   . Kidney cancer Neg Hx   . Bladder Cancer Neg Hx   . Prostate cancer Neg Hx    Current Outpatient Prescriptions on File Prior to Visit  Medication Sig  . sulfamethoxazole-trimethoprim (BACTRIM) 400-80 MG tablet Take 1 tablet by mouth daily.   No current facility-administered medications on file prior to visit.     Review of Systems    Constitutional: Negative for chills and fever.  HENT: Negative.   Respiratory: Negative for cough, chest tightness and wheezing.   Cardiovascular: Negative for chest pain and leg swelling.  Gastrointestinal: Positive for abdominal pain (LLQ from kidney stone issues. ). Negative for constipation, diarrhea, nausea and vomiting.  Endocrine: Negative.  Negative for cold intolerance, heat intolerance, polydipsia, polyphagia and polyuria.  Genitourinary: Positive for flank pain and vaginal discharge. Negative for decreased urine volume, difficulty urinating, dysuria, pelvic pain, urgency and vaginal pain.  Musculoskeletal: Positive for myalgias (muscle cramps at night. ).  Neurological: Negative for dizziness, light-headedness and numbness.  Psychiatric/Behavioral: Negative.    Per HPI unless specifically indicated above     Objective:    BP 108/62 (BP Location: Right Arm, Patient Position: Sitting, Cuff Size: Normal)   Pulse 63   Temp 98.3 F (36.8 C) (Oral)   Resp 16   Ht  (1.626 m)   Wt 162 lb (73.5 kg)   BMI 27.81 kg/m   Wt Readings from Last 3 Encounters:  02/12/16 162 lb (73.5 kg)  02/11/16 165 lb (74.8 kg)  02/05/16 168 lb 14.4 oz (76.6 kg)    Physical Exam  Constitutional: She is oriented to person, place, and time. She appears well-developed  and well-nourished.  HENT:  Head: Normocephalic and atraumatic.  Neck: Neck supple.  Cardiovascular: Normal rate, regular rhythm and normal heart sounds.  Exam reveals no gallop and no friction rub.   No murmur heard. Pulmonary/Chest: Effort normal and breath sounds normal. She has no wheezes. She exhibits no tenderness.  Abdominal: Soft. Normal appearance and bowel sounds are normal. She exhibits no distension and no mass. There is tenderness in the left lower quadrant. There is CVA tenderness (L side- known infected stone). There is no rebound and no guarding.  Genitourinary: Cervix exhibits discharge (thick white discharge).  Cervix exhibits no motion tenderness and no friability. No erythema or bleeding in the vagina. No foreign body in the vagina. Vaginal discharge found.  Genitourinary Comments: Uterus surgically absent.   Musculoskeletal: Normal range of motion. She exhibits no edema or tenderness.  Lymphadenopathy:    She has no cervical adenopathy.  Neurological: She is alert and oriented to person, place, and time.  Skin: Skin is warm and dry.  Psychiatric: She has a normal mood and affect. Her behavior is normal. Judgment and thought content normal.   Results for orders placed or performed in visit on 02/12/16  POCT Wet Prep [facility])  Result Value Ref Range   Source Wet Prep POC     WBC, Wet Prep HPF POC none    Bacteria Wet Prep HPF POC Few None, Few, Too numerous to count   BACTERIA WET PREP MORPHOLOGY POC     Clue Cells Wet Prep HPF POC None None, Too numerous to count   Clue Cells Wet Prep Whiff POC Negative Whiff    Yeast Wet Prep HPF POC Moderate    KOH Wet Prep POC     Trichomonas Wet Prep HPF POC Absent Absent      Assessment & Plan:   Problem List Items Addressed This Visit      Other   Nocturnal leg cramps   Relevant Medications   cyclobenzaprine (FLEXERIL) 10 MG tablet   Other Relevant Orders   Ferritin    Other Visit Diagnoses    Well woman exam    -  Primary   Health maintenance reviewed.   Relevant Orders   Lipid Profile   Pap,SurePath with HPV   Vaginal candidiasis       Likely 2/2 abx for kidney stone. Will treat with diflucan today. Encouraged probiotic to help prevent further symptoms at home. Return if not improving.    Relevant Medications   fluconazole (DIFLUCAN) 150 MG tablet   Other Relevant Orders   POCT Wet Prep [facility]) (Completed)   RLS (restless legs syndrome)       Relevant Orders   Ferritin   Screening for cervical cancer       Pap smear done today.    Relevant Orders   Pap,SurePath with HPV   Screening for breast cancer       Discussed  screening guidlines. Order for baseline placed- good for up to 1 year.    Relevant Orders   MM DIGITAL SCREENING BILATERAL      Meds ordered this encounter  Medications  . fluconazole (DIFLUCAN) 150 MG tablet    Sig: Take 1 tablet (150 mg total) by mouth once.    Dispense:  1 tablet    Refill:  1    Order Specific Question:   Supervising Provider    Answer:   Janeann Forehand [[id number]]  . cyclobenzaprine (FLEXERIL) 10 MG tablet  Sig: Take 1 tablet (10 mg total) by mouth at bedtime.    Dispense:  30 tablet    Refill:  5    Order Specific Question:   Supervising Provider    Answer:   Janeann Forehand [[id number]]      Follow up plan: Return in about 1 year (around 02/11/2017), or if symptoms worsen or fail to improve.

## 2016-02-12 NOTE — Patient Instructions (Addendum)
Health Maintenance, Female Adopting a healthy lifestyle and getting preventive care can go a long way to promote health and wellness. Talk with your health care provider about what schedule of regular examinations is right for you. This is a good chance for you to check in with your provider about disease prevention and staying healthy. In between checkups, there are plenty of things you can do on your own. Experts have done a lot of research about which lifestyle changes and preventive measures are most likely to keep you healthy. Ask your health care provider for more information. WEIGHT AND DIET  Eat a healthy diet  Be sure to include plenty of vegetables, fruits, low-fat dairy products, and lean protein.  Do not eat a lot of foods high in solid fats, added sugars, or salt.  Get regular exercise. This is one of the most important things you can do for your health.  Most adults should exercise for at least 150 minutes each week. The exercise should increase your heart rate and make you sweat (moderate-intensity exercise).  Most adults should also do strengthening exercises at least twice a week. This is in addition to the moderate-intensity exercise.  Maintain a healthy weight  Body mass index (BMI) is a measurement that can be used to identify possible weight problems. It estimates body fat based on height and weight. Your health care provider can help determine your BMI and help you achieve or maintain a healthy weight.  For females 41 years of age and older:   A BMI below 18.5 is considered underweight.  A BMI of 18.5 to 24.9 is normal.  A BMI of 25 to 29.9 is considered overweight.  A BMI of 30 and above is considered obese.  Watch levels of cholesterol and blood lipids  You should start having your blood tested for lipids and cholesterol at 42 years of age, then have this test every 5 years.  You may need to have your cholesterol levels checked more often if:  Your lipid  or cholesterol levels are high.  You are older than 42 years of age.  You are at high risk for heart disease.  CANCER SCREENING   Lung Cancer  Lung cancer screening is recommended for adults 78-25 years old who are at high risk for lung cancer because of a history of smoking.  A yearly low-dose CT scan of the lungs is recommended for people who:  Currently smoke.  Have quit within the past 15 years.  Have at least a 30-pack-year history of smoking. A pack year is smoking an average of one pack of cigarettes a day for 1 year.  Yearly screening should continue until it has been 15 years since you quit.  Yearly screening should stop if you develop a health problem that would prevent you from having lung cancer treatment.  Breast Cancer  Practice breast self-awareness. This means understanding how your breasts normally appear and feel.  It also means doing regular breast self-exams. Let your health care provider know about any changes, no matter how small.  If you are in your 20s or 30s, you should have a clinical breast exam (CBE) by a health care provider every 1-3 years as part of a regular health exam.  If you are 18 or older, have a CBE every year. Also consider having a breast X-ray (mammogram) every year.  If you have a family history of breast cancer, talk to your health care provider about genetic screening.  If you  are at high risk for breast cancer, talk to your health care provider about having an MRI and a mammogram every year.  Breast cancer gene (BRCA) assessment is recommended for women who have family members with BRCA-related cancers. BRCA-related cancers include:  Breast.  Ovarian.  Tubal.  Peritoneal cancers.  Results of the assessment will determine the need for genetic counseling and BRCA1 and BRCA2 testing. Cervical Cancer Your health care provider may recommend that you be screened regularly for cancer of the pelvic organs (ovaries, uterus, and  vagina). This screening involves a pelvic examination, including checking for microscopic changes to the surface of your cervix (Pap test). You may be encouraged to have this screening done every 3 years, beginning at age 21.  For women ages 30-65, health care providers may recommend pelvic exams and Pap testing every 3 years, or they may recommend the Pap and pelvic exam, combined with testing for human papilloma virus (HPV), every 5 years. Some types of HPV increase your risk of cervical cancer. Testing for HPV may also be done on women of any age with unclear Pap test results.  Other health care providers may not recommend any screening for nonpregnant women who are considered low risk for pelvic cancer and who do not have symptoms. Ask your health care provider if a screening pelvic exam is right for you.  If you have had past treatment for cervical cancer or a condition that could lead to cancer, you need Pap tests and screening for cancer for at least 20 years after your treatment. If Pap tests have been discontinued, your risk factors (such as having a new sexual partner) need to be reassessed to determine if screening should resume. Some women have medical problems that increase the chance of getting cervical cancer. In these cases, your health care provider may recommend more frequent screening and Pap tests. Colorectal Cancer  This type of cancer can be detected and often prevented.  Routine colorectal cancer screening usually begins at 42 years of age and continues through 42 years of age.  Your health care provider may recommend screening at an earlier age if you have risk factors for colon cancer.  Your health care provider may also recommend using home test kits to check for hidden blood in the stool.  A small camera at the end of a tube can be used to examine your colon directly (sigmoidoscopy or colonoscopy). This is done to check for the earliest forms of colorectal  cancer.  Routine screening usually begins at age 50.  Direct examination of the colon should be repeated every 5-10 years through 42 years of age. However, you may need to be screened more often if early forms of precancerous polyps or small growths are found. Skin Cancer  Check your skin from head to toe regularly.  Tell your health care provider about any new moles or changes in moles, especially if there is a change in a mole's shape or color.  Also tell your health care provider if you have a mole that is larger than the size of a pencil eraser.  Always use sunscreen. Apply sunscreen liberally and repeatedly throughout the day.  Protect yourself by wearing long sleeves, pants, a wide-brimmed hat, and sunglasses whenever you are outside. HEART DISEASE, DIABETES, AND HIGH BLOOD PRESSURE   High blood pressure causes heart disease and increases the risk of stroke. High blood pressure is more likely to develop in:  People who have blood pressure in the high end   of the normal range (130-139/85-89 mm Hg).  People who are overweight or obese.  People who are African American.  If you are 60-43 years of age, have your blood pressure checked every 3-5 years. If you are 86 years of age or older, have your blood pressure checked every year. You should have your blood pressure measured twice--once when you are at a hospital or clinic, and once when you are not at a hospital or clinic. Record the average of the two measurements. To check your blood pressure when you are not at a hospital or clinic, you can use:  An automated blood pressure machine at a pharmacy.  A home blood pressure monitor.  If you are between 26 years and 2 years old, ask your health care provider if you should take aspirin to prevent strokes.  Have regular diabetes screenings. This involves taking a blood sample to check your fasting blood sugar level.  If you are at a normal weight and have a low risk for diabetes,  have this test once every three years after 42 years of age.  If you are overweight and have a high risk for diabetes, consider being tested at a younger age or more often. PREVENTING INFECTION  Hepatitis B  If you have a higher risk for hepatitis B, you should be screened for this virus. You are considered at high risk for hepatitis B if:  You were born in a country where hepatitis B is common. Ask your health care provider which countries are considered high risk.  Your parents were born in a high-risk country, and you have not been immunized against hepatitis B (hepatitis B vaccine).  You have HIV or AIDS.  You use needles to inject street drugs.  You live with someone who has hepatitis B.  You have had sex with someone who has hepatitis B.  You get hemodialysis treatment.  You take certain medicines for conditions, including cancer, organ transplantation, and autoimmune conditions. Hepatitis C  Blood testing is recommended for:  Everyone born from [birth year] through 1967.  Anyone with known risk factors for hepatitis C. Sexually transmitted infections (STIs)  You should be screened for sexually transmitted infections (STIs) including gonorrhea and chlamydia if:  You are sexually active and are younger than 42 years of age.  You are older than 42 years of age and your health care provider tells you that you are at risk for this type of infection.  Your sexual activity has changed since you were last screened and you are at an increased risk for chlamydia or gonorrhea. Ask your health care provider if you are at risk.  If you do not have HIV, but are at risk, it may be recommended that you take a prescription medicine daily to prevent HIV infection. This is called pre-exposure prophylaxis (PrEP). You are considered at risk if:  You are sexually active and do not regularly use condoms or know the HIV status of your partner(s).  You take drugs by injection.  You are sexually  active with a partner who has HIV. Talk with your health care provider about whether you are at high risk of being infected with HIV. If you choose to begin PrEP, you should first be tested for HIV. You should then be tested every 3 months for as long as you are taking PrEP.  PREGNANCY   If you are premenopausal and you may become pregnant, ask your health care provider about preconception counseling.  If you may  become pregnant, take 400 to 800 micrograms (mcg) of folic acid every day.  If you want to prevent pregnancy, talk to your health care provider about birth control (contraception). OSTEOPOROSIS AND MENOPAUSE   Osteoporosis is a disease in which the bones lose minerals and strength with aging. This can result in serious bone fractures. Your risk for osteoporosis can be identified using a bone density scan.  If you are 61 years of age or older, or if you are at risk for osteoporosis and fractures, ask your health care provider if you should be screened.  Ask your health care provider whether you should take a calcium or vitamin D supplement to lower your risk for osteoporosis.  Menopause may have certain physical symptoms and risks.  Hormone replacement therapy may reduce some of these symptoms and risks. Talk to your health care provider about whether hormone replacement therapy is right for you.  HOME CARE INSTRUCTIONS   Schedule regular health, dental, and eye exams.  Stay current with your immunizations.   Do not use any tobacco products including cigarettes, chewing tobacco, or electronic cigarettes.  If you are pregnant, do not drink alcohol.  If you are breastfeeding, limit how much and how often you drink alcohol.  Limit alcohol intake to no more than 1 drink per day for nonpregnant women. One drink equals 12 ounces of beer, 5 ounces of wine, or 1 ounces of hard liquor.  Do not use street drugs.  Do not share needles.  Ask your health care provider for help if  you need support or information about quitting drugs.  Tell your health care provider if you often feel depressed.  Tell your health care provider if you have ever been abused or do not feel safe at home.   This information is not intended to replace advice given to you by your health care provider. Make sure you discuss any questions you have with your health care provider.   Document Released: 01/10/2011 Document Revised: 07/18/2014 Document Reviewed: 05/29/2013 Elsevier Interactive Patient Education [employer].

## 2016-02-14 LAB — 25-HYDROXYVITAMIN D LCMS D2+D3
25-HYDROXY, VITAMIN D-3: 20 ng/mL
25-HYDROXY, VITAMIN D: 21 ng/mL — AB

## 2016-02-15 NOTE — Progress Notes (Signed)
Low Vitamin D Results Normal levels: between 30 and 100 ng/mL. Vitamin D Insufficiency: Levels between 20-30 ng/ml are defined as a "Vitamin D insufficiency". Vitamin D Deficiency: Levels below 20 ng/ml, is diagnosed as a "Vitamin D Deficiency".  Common causes include: dietary insufficiency; inadequate sun exposure; inability to absorb vitamin D from the intestines; or inability to process it due to kidney or liver disease. Low 25-hydroxyvitamin D: A low blood level of 25-hydroxyvitamin D may mean that a person is not getting enough exposure to sunlight or enough dietary vitamin D to meet his or her body's demand or that there is a problem with its absorption from the intestines. Occasionally, drugs used to treat seizures, particularly phenytoin (Dilantin), can interfere with the production of 25-hydroxyvitamin D in the liver. There is some evidence that vitamin D deficiency may increase the risk of some cancers, immune diseases, and cardiovascular disease. Low 1,25-dihydroxyvitamin D: A low level of 1,25-dihydroxyvitamin D can be seen in kidney disease and is one of the earliest changes to occur in persons with early kidney failure. Associated complications may include: hypocalcemia, hypophosphatemia, and reduced bone density. Associated symptoms: Vitamin D deficiencies and insufficiencies may be associated with fatigue, weakness, bone pain, joint pain, and muscle pain. Recommendations: Patient may benefit from taking over-the-counter Vitamin D3 supplements. I recommend a vitamin D + Calcium supplements. "Natures Bounty", a brand easily found in most pharmacies, has a formulation containing Calcium 1200 mg plus Vitamin D3 1000 IU, in Softgels capsules that are easy to swallow. This should be taken once a day, preferably in the morning as vitamin D will increase energy levels and make it difficult to fall asleep, if taken at night. Patients with levels lower than 20 ng/ml should contact their primary care  physicians to receive replacement therapy. Vitamin D3 can be obtained over-the-counter, without a prescription. Vitamin D2 requires a prescription and it is used for replacement therapy.   

## 2016-02-16 LAB — PAP,SUREPATH WITH HPV: HPV DNA HIGH RISK: NOT DETECTED

## 2016-02-19 LAB — COMPLIANCE DRUG ANALYSIS, UR: PDF: 0

## 2016-03-07 ENCOUNTER — Telehealth: Payer: Self-pay | Admitting: Urology

## 2016-03-07 NOTE — Telephone Encounter (Signed)
Pt is having pain in left side.  She wanted to see Dr. Apolinar JunesBrandon this week.  I told her there was no availability. Please give pt a call. [phone]

## 2016-03-07 NOTE — Telephone Encounter (Signed)
Spoke with pt who stated she is having severe left sided pain. Pt stated she has been taking abx as prescribed. Pt states she has been seeing pain management and has an upcoming appt with physiatrist. Pt stated she just wants the pain gone and is considering nephrectomy. Please advise.

## 2016-03-07 NOTE — Telephone Encounter (Signed)
Please have her schedule a non-urgent appointment to discuss in person.  I don't see that she has a scheduled follow up.  Vanna ScotlandAshley Chasty Randal, MD

## 2016-03-07 NOTE — Telephone Encounter (Signed)
I made her an appt for 04-06-16 with you to discuss. Patient was ok with that.   Katelyn Holland

## 2016-03-22 ENCOUNTER — Ambulatory Visit (INDEPENDENT_AMBULATORY_CARE_PROVIDER_SITE_OTHER): Payer: BLUE CROSS/BLUE SHIELD | Admitting: Family Medicine

## 2016-03-22 ENCOUNTER — Encounter: Payer: Self-pay | Admitting: Family Medicine

## 2016-03-22 VITALS — BP 105/72 | HR 88 | Temp 98.3°F | Resp 16 | Ht 64.0 in | Wt 172.8 lb

## 2016-03-22 DIAGNOSIS — G4441 Drug-induced headache, not elsewhere classified, intractable: Secondary | ICD-10-CM

## 2016-03-22 DIAGNOSIS — R002 Palpitations: Secondary | ICD-10-CM | POA: Diagnosis not present

## 2016-03-22 DIAGNOSIS — G4440 Drug-induced headache, not elsewhere classified, not intractable: Secondary | ICD-10-CM

## 2016-03-22 MED ORDER — KETOROLAC TROMETHAMINE 10 MG PO TABS: 10.0000 mg | ORAL_TABLET | Freq: Four times a day (QID) | ORAL | 0 refills | Status: DC | PRN

## 2016-03-22 MED ORDER — KETOROLAC TROMETHAMINE 60 MG/2ML IM SOLN
60.0000 mg | Freq: Once | INTRAMUSCULAR | Status: AC
  Administered 2016-03-22: 60 mg via INTRAMUSCULAR

## 2016-03-22 NOTE — Patient Instructions (Addendum)
I think your headache was triggered by taking your husband's medication.  We will treat it with a shot of toradol today and you can follow-up with Toradol pills 10mg  every 6 hours as needed. Take no other Advil or Ibuprofen with this medication. You can however take tylenol.   If your symptoms fail to improve- we may order imaging of your head.  Go to the ER for concerning symptoms or the symptoms listed below.   SEEK IMMEDIATE MEDICAL CARE IF:   Your migraine becomes severe.  You have a fever.  You have a stiff neck.  You have vision loss.  You have muscular weakness or loss of muscle control.  You start losing your balance or have trouble walking.  You feel faint or pass out.  You have severe symptoms that are different from your first symptoms.  You have chest pain or palpitations  Elsevier Interactive Patient Education 2016 [employer]

## 2016-03-22 NOTE — Progress Notes (Signed)
Subjective:    Patient ID: Katelyn Holland, female    DOB: [birth year], 40 y.o.   MRN: [id number]  HPI: Katelyn GowerCrystal M Holtmeyer is a 40 y.o. female presenting on 03/22/2016 for Headache (as per pt spouse gave his pill to the patient by mistake and she is having HA from past 2 days)   HPI  Pt presents for headache. R side of head. Throbbing pain. No nausea. Sensitivity to noise. HA present for 2 days. Husband may have accidentally given her 2 doses of lisinopril 10mg - 2 pills same day. After lisinopril dosing HA began.  Pt is also having cold chills and hot flashes. No chest pain. No shortness of breath. Did feel like her heart was racing after she took the lisinopril yesterday. Heart palpitations lasted for about 30 minutes no chest pain at that time. No dizziness. Blurred vision since HA comes and goes. No slurring of speech. No numbness or tingling on one side of the body.  Headache has stayed the same for the past 2 days. Mild relief with Advil. No fevers.    Past Medical History:  Diagnosis Date  . Arthritis   . Chronic constipation   . Gravida 1 para 1   . Kidney stone   . Kidney stones     Current Outpatient Prescriptions on File Prior to Visit  Medication Sig  . cyclobenzaprine (FLEXERIL) 10 MG tablet Take 1 tablet (10 mg total) by mouth at bedtime.  . sulfamethoxazole-trimethoprim (BACTRIM) 400-80 MG tablet Take 1 tablet by mouth daily.   No current facility-administered medications on file prior to visit.     Review of Systems Per HPI unless specifically indicated above     Objective:    BP 105/72 (BP Location: Right Arm, Patient Position: Sitting, Cuff Size: Normal)   Pulse 88   Temp 98.3 F (36.8 C) (Oral)   Resp 16   Ht 5\' 4"  (1.626 m)   Wt 172 lb 12.8 oz (78.4 kg)   BMI 29.66 kg/m   Wt Readings from Last 3 Encounters:  03/22/16 172 lb 12.8 oz (78.4 kg)  02/12/16 162 lb (73.5 kg)  02/11/16 165 lb (74.8 kg)    Physical Exam  Constitutional: She appears  well-developed and well-nourished. No distress.  Eyes: Conjunctivae, EOM and lids are normal. Pupils are equal, round, and reactive to light.  Neck: Normal range of motion. Neck supple. No edema and no erythema present. No Brudzinski's sign and no Kernig's sign noted.  Cardiovascular: Normal rate and regular rhythm.  Exam reveals no gallop and no friction rub.   No murmur heard. Pulmonary/Chest: Effort normal and breath sounds normal. She has no wheezes. She exhibits no tenderness.  Musculoskeletal: Normal range of motion. She exhibits no edema, tenderness or deformity.  Neurological: She has normal strength. No cranial nerve deficit or sensory deficit. Coordination and gait normal.  Reflex Scores:      Patellar reflexes are 2+ on the right side and 2+ on the left side. Finger to nose intact. Heel to shin intact. Rapid alternating movements intact.   Skin: Skin is warm and dry. She is not diaphoretic.   Results for orders placed or performed in visit on 02/12/16  Pap,SurePath with HPV  Result Value Ref Range   HPV DNA High Risk Not Detected    Specimen adequacy:     FINAL DIAGNOSIS:     COMMENTS:     Cytotechnologist:        Assessment & Plan:  Problem List Items Addressed This Visit    None    Visit Diagnoses    Medication overuse headache    -  Primary   HA likely 2/2 accidental dosing of lisinopril. Pt is neurologically intact. Will treat with toradol injection for pain. PRN toradol at home. Check GFR and CBC. Alarm symoptoms reviewed with patient- to ER with concerning symptoms. Consider imaging of the head if headaches continue since onset >age 57. Recheck 2 weeks or as needed.    Relevant Medications   ketorolac (TORADOL) injection 60 mg   ketorolac (TORADOL) 10 MG tablet   Other Relevant Orders   BASIC METABOLIC PANEL WITH GFR   CBC with Differential   Palpitations       Likely 2/2 drop in blood pressure. ECG WNL today. Pt to ER with continued palpitations.    Relevant  Orders   EKG 12-Lead      Meds ordered this encounter  Medications  . ketorolac (TORADOL) injection 60 mg  . ketorolac (TORADOL) 10 MG tablet    Sig: Take 1 tablet (10 mg total) by mouth every 6 (six) hours as needed.    Dispense:  20 tablet    Refill:  0    Order Specific Question:   Supervising Provider    Answer:   Janeann Forehand [[id number]]      Follow up plan: Return in about 2 weeks (around 04/05/2016), or if symptoms worsen or fail to improve.

## 2016-03-23 LAB — CBC WITH DIFFERENTIAL/PLATELET
BASOS ABS: 50 {cells}/uL (ref 0–200)
Basophils Relative: 1 %
EOS ABS: 50 {cells}/uL (ref 15–500)
Eosinophils Relative: 1 %
HCT: 38 % (ref 35.0–45.0)
HEMOGLOBIN: 12.9 g/dL (ref 11.7–15.5)
LYMPHS ABS: 1800 {cells}/uL (ref 850–3900)
Lymphocytes Relative: 36 %
MCH: 28.2 pg (ref 27.0–33.0)
MCHC: 33.9 g/dL (ref 32.0–36.0)
MCV: 83.2 fL (ref 80.0–100.0)
MONOS PCT: 4 %
MPV: 10 fL (ref 7.5–12.5)
Monocytes Absolute: 200 cells/uL (ref 200–950)
NEUTROS ABS: 2900 {cells}/uL (ref 1500–7800)
Neutrophils Relative %: 58 %
Platelets: 214 10*3/uL (ref 140–400)
RBC: 4.57 MIL/uL (ref 3.80–5.10)
RDW: 12.4 % (ref 11.0–15.0)
WBC: 5 10*3/uL (ref 3.8–10.8)

## 2016-03-23 LAB — BASIC METABOLIC PANEL WITH GFR
BUN: 11 mg/dL (ref 7–25)
CALCIUM: 8.7 mg/dL (ref 8.6–10.2)
CO2: 26 mmol/L (ref 20–31)
Chloride: 104 mmol/L (ref 98–110)
Creat: 0.58 mg/dL (ref 0.50–1.10)
GFR, Est African American: >89 mL/min (ref >=60)
GLUCOSE: 83 mg/dL (ref 65–99)
Potassium: 4 mmol/L (ref 3.5–5.3)
Sodium: 138 mmol/L (ref 135–146)

## 2016-03-29 IMAGING — CR DG ABDOMEN 1V
1 series · 1 of 1 positions shown · non-contrast
Comparison: June 08, 2015.

CLINICAL DATA: Left-sided nephrolithiasis.

EXAM:
ABDOMEN - 1 VIEW

[dg abd 1 view]
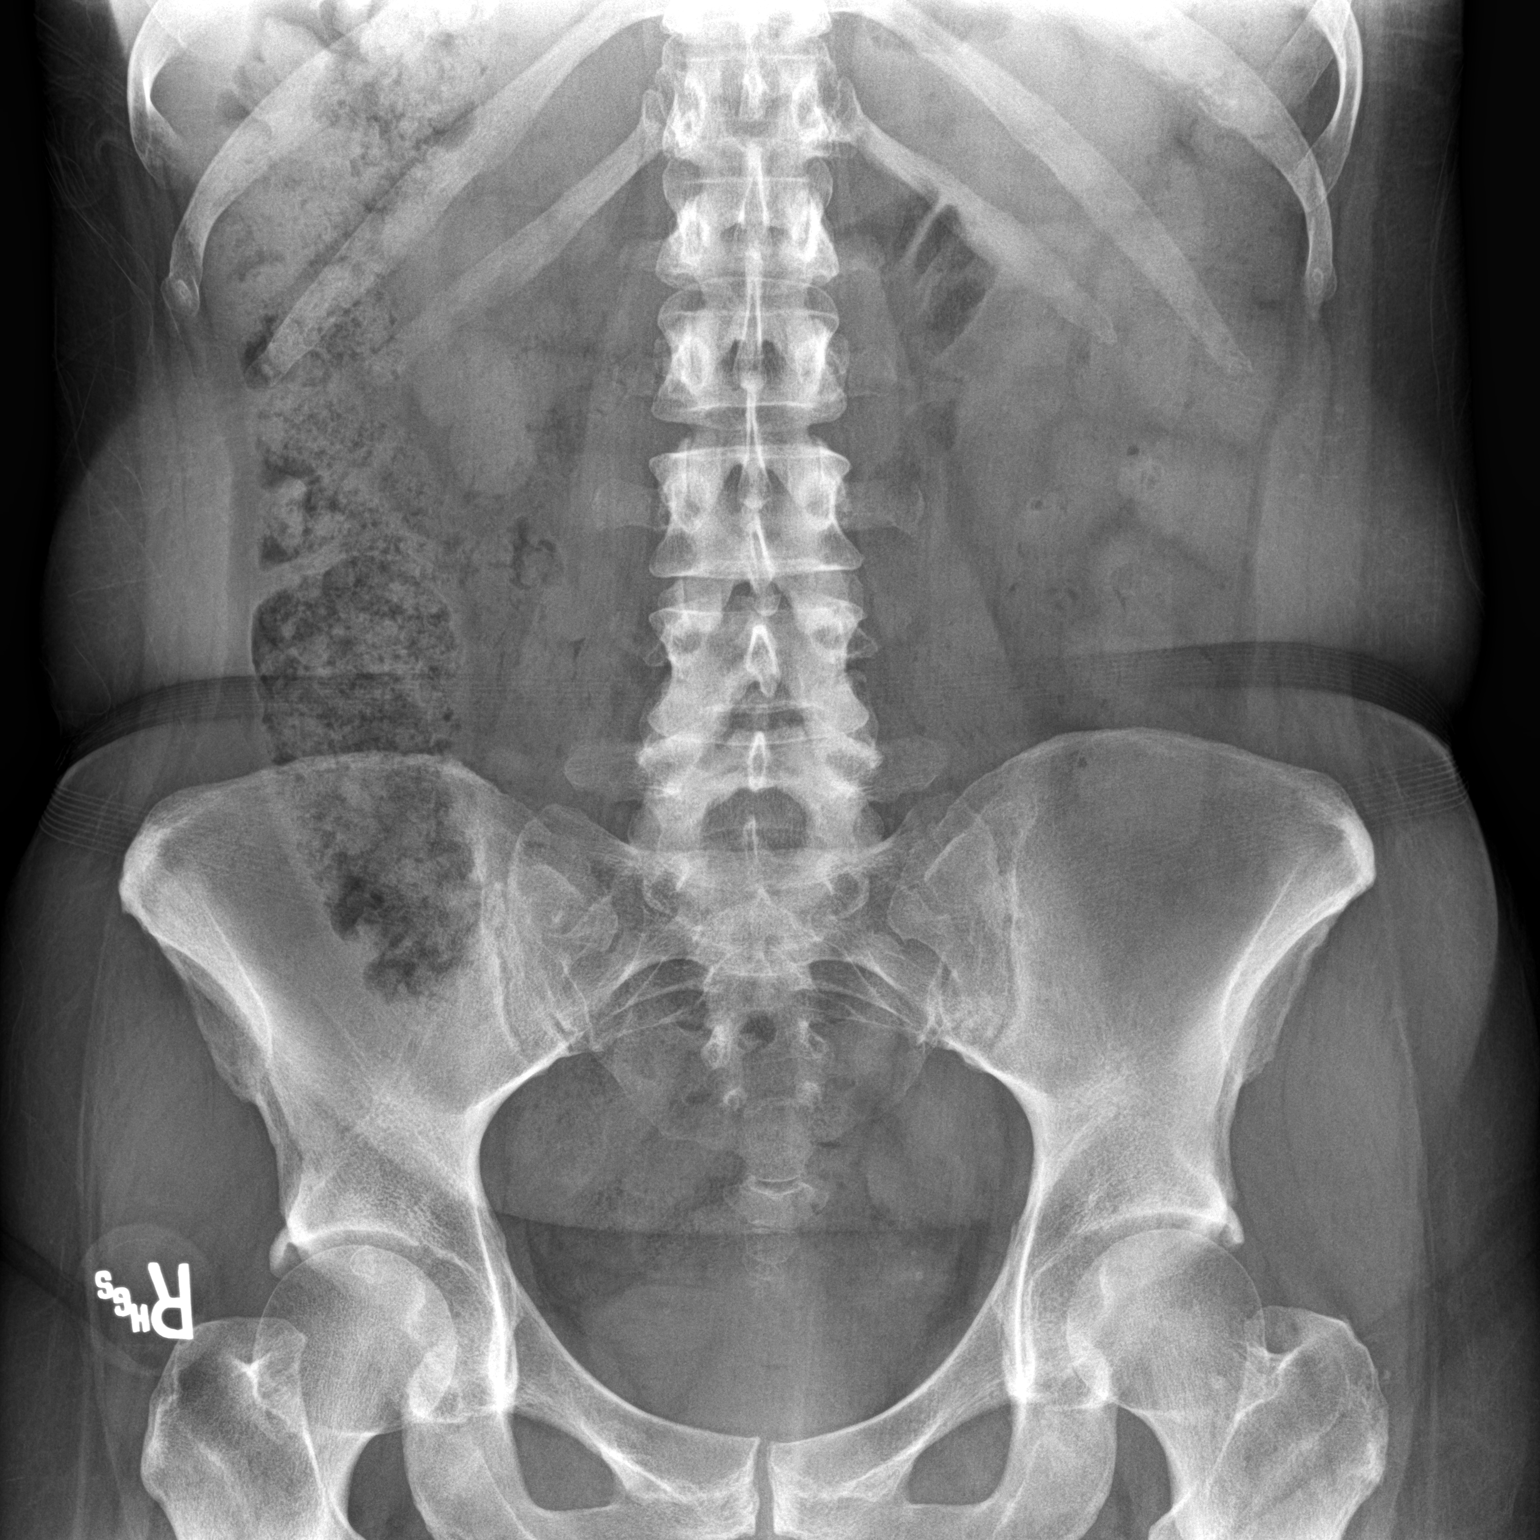

[1 of 1 positions shown; findings below may reference images not displayed]

FINDINGS: The bowel gas pattern is normal. Probable phleboliths are seen in
the pelvis. Left renal calculi noted on prior exam are no longer
present.
IMPRESSION: No evidence of bowel obstruction or ileus. Left nephrolithiasis
noted on prior exam is no longer visualized.

## 2016-04-06 ENCOUNTER — Ambulatory Visit: Payer: BLUE CROSS/BLUE SHIELD | Admitting: Urology

## 2016-04-20 ENCOUNTER — Ambulatory Visit: Payer: BLUE CROSS/BLUE SHIELD | Attending: Pain Medicine | Admitting: Pain Medicine

## 2016-04-20 ENCOUNTER — Encounter: Payer: Self-pay | Admitting: Pain Medicine

## 2016-04-20 VITALS — BP 122/64 | HR 80 | Temp 98.1°F | Resp 18 | Ht 64.0 in

## 2016-04-20 DIAGNOSIS — Z90710 Acquired absence of both cervix and uterus: Secondary | ICD-10-CM | POA: Insufficient documentation

## 2016-04-20 DIAGNOSIS — Z9049 Acquired absence of other specified parts of digestive tract: Secondary | ICD-10-CM | POA: Insufficient documentation

## 2016-04-20 DIAGNOSIS — N1330 Unspecified hydronephrosis: Secondary | ICD-10-CM | POA: Insufficient documentation

## 2016-04-20 DIAGNOSIS — Z8249 Family history of ischemic heart disease and other diseases of the circulatory system: Secondary | ICD-10-CM | POA: Diagnosis not present

## 2016-04-20 DIAGNOSIS — Z79891 Long term (current) use of opiate analgesic: Secondary | ICD-10-CM | POA: Insufficient documentation

## 2016-04-20 DIAGNOSIS — M48061 Spinal stenosis, lumbar region without neurogenic claudication: Secondary | ICD-10-CM | POA: Insufficient documentation

## 2016-04-20 DIAGNOSIS — Z6829 Body mass index (BMI) 29.0-29.9, adult: Secondary | ICD-10-CM | POA: Insufficient documentation

## 2016-04-20 DIAGNOSIS — M5126 Other intervertebral disc displacement, lumbar region: Secondary | ICD-10-CM | POA: Insufficient documentation

## 2016-04-20 DIAGNOSIS — G8929 Other chronic pain: Secondary | ICD-10-CM

## 2016-04-20 DIAGNOSIS — Z885 Allergy status to narcotic agent status: Secondary | ICD-10-CM | POA: Diagnosis not present

## 2016-04-20 DIAGNOSIS — M545 Low back pain: Secondary | ICD-10-CM

## 2016-04-20 DIAGNOSIS — Z91041 Radiographic dye allergy status: Secondary | ICD-10-CM | POA: Diagnosis not present

## 2016-04-20 DIAGNOSIS — R1032 Left lower quadrant pain: Secondary | ICD-10-CM | POA: Diagnosis not present

## 2016-04-20 DIAGNOSIS — Z903 Acquired absence of stomach [part of]: Secondary | ICD-10-CM | POA: Diagnosis not present

## 2016-04-20 DIAGNOSIS — R109 Unspecified abdominal pain: Secondary | ICD-10-CM | POA: Diagnosis not present

## 2016-04-20 DIAGNOSIS — Z809 Family history of malignant neoplasm, unspecified: Secondary | ICD-10-CM | POA: Insufficient documentation

## 2016-04-20 DIAGNOSIS — K5909 Other constipation: Secondary | ICD-10-CM | POA: Diagnosis not present

## 2016-04-20 DIAGNOSIS — Z87442 Personal history of urinary calculi: Secondary | ICD-10-CM | POA: Insufficient documentation

## 2016-04-20 MED ORDER — GABAPENTIN 100 MG PO CAPS: 100.0000 mg | ORAL_CAPSULE | Freq: Every day | ORAL | 0 refills | Status: DC

## 2016-04-20 NOTE — Patient Instructions (Signed)
____________________________________________________________________________________________  Initial Gabapentin Titration  Medication used: Gabapentin (Generic Name) or Neurontin (Brand Name) 100 mg tablets/capsules  Reasons to stop increasing the dose:  Reason 1: You get good relief of symptoms, in which case there is no need to increase the daily dose any further.    Reason 2: You develop some side effects, such as sleeping all of the time, difficulty concentrating, or becoming disoriented, in which case you need to go down on the dose, to the prior level, where you were not experiencing any side effects. Stay on that dose longer, to allow more time for your body to get use it, before attempting to increase it again.   Steps: Step 1: Start by taking 1 (one) tablet at bedtime x 7 (seven) days.  Step 2: After being on 1 (one) tablet for 7 (seven) days, then increase it to 2 (two) tablets at bedtime for another 7 (seven) days.  Step 3: Next, after being on 2 (two) tablets at bedtime for 7 (seven) days, then increase it to 3 (three) tablets at bedtime, and stay on that dose until you see your doctor.  Reasons to stop increasing the dose: Reason 1: You get good relief of symptoms, in which case there is no need to increase the daily dose any further.  Reason 2: You develop some side effects, such as sleeping all of the time, difficulty concentrating, or becoming disoriented, in which case you need to go down on the dose, to the prior level, where you were not experiencing any side effects. Stay on that dose longer, to allow more time for your body to get use it, before attempting to increase it again.  Endpoint: Once you have reached the maximum dose you can tolerate without side-effects, contact your physician so as to evaluate the results of the regimen.   Questions: Feel free to contact us for any questions or problems at [phone] ____________________________________________________________________________________________  

## 2016-04-20 NOTE — Progress Notes (Signed)
Safety precautions to be maintained throughout the outpatient stay will include: orient to surroundings, keep bed in low position, maintain call bell within reach at all times, provide assistance with transfer out of bed and ambulation.  

## 2016-04-20 NOTE — Progress Notes (Signed)
Patient's Name: Katelyn Holland  MRN: [id number]  Referring Provider: Loura Pardon, NP  DOB: 1976/03/11  PCP: Filbert Berthold, NP  DOS: 04/20/2016  Note by: Sydnee Levans. Laban Emperor, MD  Service setting: Ambulatory outpatient  Specialty: Interventional Pain Management  Location: [facility] (AMB) Pain Management Facility    Patient type: Established   Primary Reason(s) for Visit: Encounter for evaluation before starting new chronic pain management plan of care (Level of risk: moderate) CC: Back Pain (left side )  HPI  Ms. Katelyn Holland is a 40 y.o. year old, female patient, who comes today for an initial evaluation. She has Family history of heart disease in female family member before age 32; Low libido; Emphysematous pyelonephritis; Chronic nephrolithiasis (Left); Chronic constipation; Chronic Hydronephrosis of kidney (Left); UPJ (ureteropelvic junction) obstruction; Hypokalemia; Anemia; Morbid obesity (HCC); Chronic pain; Long term current use of opiate analgesic; Long term prescription opiate use; Opiate use; Current low back pain (Location of Primary Source of Pain) (Left); Chronic flank pain (Left); Encounter for pain management planning; Chronic left lower quadrant pain; and Nocturnal leg cramps on her problem list.. Her primarily concern today is the Back Pain (left side )  Pain Assessment: Self-Reported Pain Score: 2 /10             Reported level is compatible with observation.       Pain Type: Chronic pain Pain Location: Back Pain Orientation: Left, Lower Pain Descriptors / Indicators: Sharp, Throbbing Pain Frequency: Constant  The patient comes into the clinics today for post-procedure evaluation on the interventional treatment done on 02/11/2016. In addition, she comes in today for pharmacological management of her chronic pain.  The patient  reports that she does not use drugs.  Date of Last Visit: 02/11/16 Service Provided on Last Visit: Evaluation (new patient )  Controlled Substance  Pharmacotherapy Assessment REMS (Risk Evaluation and Mitigation Strategy)  Analgesic: The last prescription recorded on the PMP was tramadol 50 mg #20 to be taken one every 6 hours, failed on 02/01/2016. MME/day: 20 mg/day Pill Count: None expected due to no prior prescriptions written by our practice. Pharmacokinetics: Onset of action (Liberation/Absorption): Within expected pharmacological parameters Time to Peak effect (Distribution): Timing and results are as within normal expected parameters Duration of action (Metabolism/Excretion): Within normal limits for medication Pharmacodynamics: Analgesic Effect: More than 50% Activity Facilitation: Medication(s) allow patient to sit, stand, walk, and do the basic ADLs Perceived Effectiveness: Described as relatively effective, allowing for increase in activities of daily living (ADL) Side-effects or Adverse reactions: None reported Monitoring: [place] PMP: Online review of the past 68-month period conducted. Compliant with practice rules and regulations List of all UDS test(s) done:  Lab Results  Component Value Date   SUMMARY FINAL 02/11/2016   Last UDS on record: Summary  Date Value Ref Range Status  02/11/2016 FINAL  Final    Comment:    ==================================================================== [facility] COMP DRUG ANALYSIS,UR ==================================================================== Test                             Result       Flag       Units   NO DRUGS DETECTED. ==================================================================== Test                      Result    Flag   Units      Ref Range   Creatinine  163              mg/dL      >=60>=20 ==================================================================== Declared Medications:  The flagging and interpretation on this report are based on the  following declared medications.  Unexpected results may arise from  inaccuracies in the declared  medications.  **Note: The testing scope of this panel does not include following  reported medications:  Sulfamethoxazole  Trimethoprim ==================================================================== For clinical consultation, please call [phone]. ====================================================================    UDS interpretation: Compliant          Medication Assessment Form: Reviewed. Patient indicates being compliant with therapy Treatment compliance: Not applicable yet Risk Assessment Profile: Aberrant Behavior: None observed today Substance Use Disorder (SUD) Risk Level: Low Risk of opioid abuse or dependence: 0.7-3.0% with doses ? 36 MME/day and 6.1-26% with doses ? 120 MME/day. Opioid Risk Tool (ORT) Score: 3 Low Risk for SUD (Score <3) Depression Scale Score: PHQ-2: 0 No depression (0) PHQ-9: 0 No depression (0-4)  Pharmacologic Plan: Today we may be taking over the patient's pharmacological regimen. See below  Laboratory Chemistry  Inflammation Markers Lab Results  Component Value Date   ESRSEDRATE 16 02/11/2016   CRP <0.5 02/11/2016   Renal Function Lab Results  Component Value Date   BUN 11 03/22/2016   CREATININE 0.58 03/22/2016   GFRAA >89 03/22/2016   GFRNONAA >89 03/22/2016   Hepatic Function Lab Results  Component Value Date   AST 17 02/11/2016   ALT 16 02/11/2016   ALBUMIN 4.4 02/11/2016   Electrolytes Lab Results  Component Value Date   NA 138 03/22/2016   K 4.0 03/22/2016   CL 104 03/22/2016   CALCIUM 8.7 03/22/2016   MG 1.9 02/11/2016   Pain Modulating Vitamins Lab Results  Component Value Date   25OHVITD1 21 (L) 02/11/2016   25OHVITD2 <1.0 02/11/2016   25OHVITD3 20 02/11/2016   VITAMINB12 252 02/11/2016   Coagulation Parameters Lab Results  Component Value Date   INR 1.10 07/08/2015   LABPROT 14.4 07/08/2015   PLT 214 03/22/2016   Cardiovascular Lab Results  Component Value Date   HGB 12.9 03/22/2016    HCT 38.0 03/22/2016   Note: Lab results reviewed.  Recent Diagnostic Imaging Review  Dg Lumbar Spine Complete W/bend  Result Date: 02/11/2016 CLINICAL DATA:  Pt states she was seen in the ER 1-2 wks ago and was diagnoses with a left kidney stone, Dr referred her for pain mgmt. Currently having left sided back pain that radiates anteriorly into lower left abd quadrant. EXAM: LUMBAR SPINE - COMPLETE WITH BENDING VIEWS COMPARISON:  None. FINDINGS: Mild disc height loss identified at L4-5. No evidence for acute fracture or traumatic subluxation. No suspicious lytic or blastic lesions are identified. Surgical clips are identified in the left upper quadrant consistent with prior bowel resection. Tiny calcifications are identified within the left hemipelvis, not further characterized. No definite calcifications overlying the contour of the kidneys or ureters. Coarse calcification overlying the right buttock is consistent with an injection granuloma as identified on prior CT exam. IMPRESSION: 1. No evidence for acute  abnormality. 2. Mild degenerative change at L4-5. Electronically Signed   By: Norva PavlovElizabeth  Brown M.D.   On: 02/11/2016 09:47   Dg Abd 1 View  Result Date: 02/11/2016 CLINICAL DATA:  Recently diagnosed with a left-sided kidney stone. The patient fourth left-sided back pain radiating anteriorly into the lower left abdominal quadrant, patient reports 2 previous back surgical procedures. EXAM: ABDOMEN - 1 VIEW COMPARISON:  KUB radiographs of January 31, 2016 FINDINGS: No definite calcifications project over either kidney. The images are limited however due to excessive colonic stool and gas. No ureteral or bladder stones are observed. The bony structures are unremarkable. IMPRESSION: No definite calcified urinary tract stones are observed. The colonic stool burden is moderately increased though this is not a new finding. Electronically Signed   By: David  [place] M.D.   On: 02/11/2016 09:52   Lumbosacral  Imaging: Lumbar MR wo contrast:  Results for orders placed in visit on 12/29/10  MR L Spine Ltd W/O Cm   Narrative * PRIOR REPORT IMPORTED FROM AN EXTERNAL SYSTEM *   PRIOR REPORT IMPORTED FROM THE [facility] WORKFLOW SYSTEM   REASON FOR EXAM:    Lower back pain w/ Sciatica.  Please Evaluate.  COMMENTS:   PROCEDURE:     MR  - MR LUMBAR SPINE WO CONTRAST  - Dec 30 2010  2:23PM   RESULT:     MRI LUMBAR SPINE WITHOUT CONTRAST   HISTORY: Low back pain   COMPARISON: None   TECHNIQUE: Multiplanar and multisequence MRI of the lumbar spine were  obtained, without administration of IV contrast.   FINDINGS:   The vertebral bodies of the lumbar spine are normal in size and alignment.  There is normal bone marrow signal demonstrated throughout the vertebra.  The  intervertebral disc spaces are well-maintained.   The spinal cord is of normal volume and contour. The cord terminates  normally at L1 . The nerve roots of the cauda equina and the filum  terminale  have the usual appearance.   The visualized portions of the SI joints are unremarkable.   The imaged intra-abdominal contents are unremarkable.   T12-L1: No significant disc bulge. No evidence of neural foraminal or  central stenosis.   L1-L2: No significant disc bulge. No evidence of neural foraminal or  central  stenosis.   L2-L3: No significant disc bulge. No evidence of neural foraminal or  central  stenosis.   L3-L4: Mild broad-based disc bulge. Mild bilateral facet arthropathy. No  evidence of neural foraminal or central stenosis.   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 There is no  foraminal stenosis.   L5-S1: No significant disc bulge. No evidence of neural foraminal or  central  stenosis.   IMPRESSION:   1. 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       Lumbar DG 2-3 views:  Results for orders placed in visit on 06/09/13  DG Lumbar Spine 2-3 Views   Narrative * PRIOR REPORT IMPORTED FROM AN EXTERNAL SYSTEM *   CLINICAL DATA:  Low back pain   EXAM:  LUMBAR SPINE - 2-3 VIEW   COMPARISON:  12/30/2010   FINDINGS:  Innumerable calculi project over the lower pole of the left kidney.  Mild degenerative change of the SI joints.   Anatomic alignment. Moderate narrowing of the L4-5 disc with  posterior disc osteophytes. No vertebral compression deformity.   IMPRESSION:  No acute bony pathology.    Electronically Signed    By: Maryclare Bean M.D.    On: 06/09/2013 18:05       Lumbar  DG Bending views:  Results for orders placed during the hospital encounter of 02/11/16  DG Lumbar Spine Complete W/Bend   Narrative CLINICAL DATA:  Pt states she was seen in the ER 1-2 wks ago and was diagnoses with a left kidney stone, Dr referred her for pain mgmt. Currently having left sided back pain that radiates anteriorly into lower left abd quadrant.  EXAM: LUMBAR SPINE - COMPLETE WITH BENDING VIEWS  COMPARISON:  None.  FINDINGS: Mild disc height loss identified at L4-5. No evidence for acute fracture or traumatic subluxation. No suspicious lytic or blastic lesions are identified.  Surgical clips are identified in the left upper quadrant consistent with prior bowel resection. Tiny calcifications are identified within the left hemipelvis, not further characterized. No definite calcifications overlying the contour of the kidneys or ureters. Coarse calcification overlying the right buttock is consistent with an injection granuloma as identified on prior CT exam.  IMPRESSION: 1. No evidence for acute  abnormality. 2. Mild degenerative change at  L4-5.   Electronically Signed   By: Norva Pavlov M.D.   On: 02/11/2016 09:47    Note: Imaging results reviewed.  Meds  The patient has a current medication list which includes the following prescription(s): cyclobenzaprine, gabapentin, and sulfamethoxazole-trimethoprim.  Current Outpatient Prescriptions on File Prior to Visit  Medication Sig  . cyclobenzaprine (FLEXERIL) 10 MG tablet Take 1 tablet (10 mg total) by mouth at bedtime.  . sulfamethoxazole-trimethoprim (BACTRIM) 400-80 MG tablet Take 1 tablet by mouth daily.   No current facility-administered medications on file prior to visit.    ROS  Constitutional: Denies any fever or chills Gastrointestinal: No reported hemesis, hematochezia, vomiting, or acute GI distress Musculoskeletal: Denies any acute onset joint swelling, redness, loss of ROM, or weakness Neurological: No reported episodes of acute onset apraxia, aphasia, dysarthria, agnosia, amnesia, paralysis, loss of coordination, or loss of consciousness  Allergies  Ms. Cavness is allergic to dilaudid  [hydromorphone hcl] and iodinated diagnostic agents.  PFSH  Medical: Ms. Hirsch  has a past medical history of Arthritis; Chronic constipation; Gravida 1 para 1; Kidney stone; and Kidney stones. Family: family history includes Cancer in her maternal grandmother; Heart disease in her father; Heart disease (age of onset: 76) in her mother. Surgical:  has a past surgical history that includes Abdominal hysterectomy; Laparoscopic gastric sleeve resection (2016); Lithotripsy (2007); Appendectomy; Back surgery; Kidney stone surgery (Left, 2000); and Nephrolithotomy (Left, 07/23/2015). Tobacco:  reports that she has never smoked. She has never used smokeless tobacco. Alcohol:  reports that she drinks alcohol. Drug:  reports that she does not use drugs.  Constitutional Exam  General appearance: Well nourished, well developed, and well hydrated. In no apparent acute  distress Vitals:   04/20/16 0957  BP: 122/64  Pulse: 80  Resp: 18  Temp: 98.1 F (36.7 C)  SpO2: 100%  Height: 5\' 4"  (1.626 m)  BMI Assessment: Estimated body mass index is 29.66 kg/m as calculated from the following:   Height as of 03/22/16: 5\' 4"  (1.626 m).   Weight as of 03/22/16: 172 lb 12.8 oz (78.4 kg).   BMI interpretation: (25-29.9 kg/m2) = Overweight: This range is associated with a 20% higher incidence of chronic pain. BMI Readings from Last 4 Encounters:  03/22/16 29.66 kg/m  02/12/16 27.81 kg/m  02/11/16 28.32 kg/m  02/05/16 28.99 kg/m   Wt Readings from Last 4 Encounters:  03/22/16 172 lb 12.8 oz (78.4 kg)  02/12/16 162 lb (73.5 kg)  02/11/16 165 lb (  74.8 kg)  02/05/16 168 lb 14.4 oz (76.6 kg)  Psych/Mental status: Alert, oriented x 3 (person, place, & time) Eyes: PERLA Respiratory: No evidence of acute respiratory distress  Cervical Spine Exam  Inspection: No masses, redness, or swelling Alignment: Symmetrical Functional ROM: Unrestricted ROM Stability: No instability detected Muscle strength & Tone: Functionally intact Sensory: Unimpaired Palpation: Non-contributory  Upper Extremity (UE) Exam    Side: Right upper extremity  Side: Left upper extremity  Inspection: No masses, redness, swelling, or asymmetry  Inspection: No masses, redness, swelling, or asymmetry  Functional ROM: Unrestricted ROM         Functional ROM: Unrestricted ROM          Muscle strength & Tone: Functionally intact  Muscle strength & Tone: Functionally intact  Sensory: Unimpaired  Sensory: Unimpaired  Palpation: Non-contributory  Palpation: Non-contributory   Thoracic Spine Exam  Inspection: No masses, redness, or swelling Alignment: Symmetrical Functional ROM: Unrestricted ROM Stability: No instability detected Sensory: Unimpaired Muscle strength & Tone: Functionally intact Palpation: Non-contributory  Lumbar Spine Exam  Inspection: No masses, redness, or  swelling Alignment: Symmetrical Functional ROM: Unrestricted ROM Stability: No instability detected Muscle strength & Tone: Functionally intact Sensory: Unimpaired Palpation: Non-contributory Provocative Tests: Lumbar Hyperextension and rotation test: evaluation deferred today       Patrick's Maneuver: evaluation deferred today              Gait & Posture Assessment  Ambulation: Unassisted Gait: Relatively normal for age and body habitus Posture: WNL   Lower Extremity Exam    Side: Right lower extremity  Side: Left lower extremity  Inspection: No masses, redness, swelling, or asymmetry  Inspection: No masses, redness, swelling, or asymmetry  Functional ROM: Unrestricted ROM          Functional ROM: Unrestricted ROM          Muscle strength & Tone: Functionally intact  Muscle strength & Tone: Functionally intact  Sensory: Unimpaired  Sensory: Unimpaired  Palpation: Non-contributory  Palpation: Non-contributory   Assessment & Plan  Primary Diagnosis & Pertinent Problem List: The primary encounter diagnosis was Chronic flank pain (Left). Diagnoses of Chronic Hydronephrosis of kidney (Left), Chronic left lower quadrant pain, and Current low back pain (Location of Primary Source of Pain) (Left) were also pertinent to this visit.  Visit Diagnosis: 1. Chronic flank pain (Left)   2. Chronic Hydronephrosis of kidney (Left)   3. Chronic left lower quadrant pain   4. Current low back pain (Location of Primary Source of Pain) (Left)     Problems updated and reviewed during this visit: Problem  Current low back pain (Location of Primary Source of Pain) (Left)    Problem-specific Plan(s): No problem-specific Assessment & Plan notes found for this encounter.  No new Assessment & Plan notes have been filed under this hospital service since the last note was generated. Service: Pain Management   Plan of Care   Problem List Items Addressed This Visit      High   Chronic flank pain  (Left) - Primary (Chronic)   Relevant Medications   gabapentin (NEURONTIN) 100 MG capsule   Chronic Hydronephrosis of kidney (Left) (Chronic)   Chronic left lower quadrant pain (Chronic)   Current low back pain (Location of Primary Source of Pain) (Left) (Chronic)    Other Visit Diagnoses   None.    Pharmacotherapy (Medications Ordered): Meds ordered this encounter  Medications  . gabapentin (NEURONTIN) 100 MG capsule    Sig: Take 1-3  capsules (100-300 mg total) by mouth at bedtime. Follow titration schedule.    Dispense:  90 capsule    Refill:  0    Do not place this medication, or any other prescription from our practice, on "Automatic Refill". Patient may have prescription filled one day early if pharmacy is closed on scheduled refill date.   New Prescriptions   GABAPENTIN (NEURONTIN) 100 MG CAPSULE    Take 1-3 capsules (100-300 mg total) by mouth at bedtime. Follow titration schedule.   Medications administered during this visit: Ms. Oquendo had no medications administered during this visit. Lab-work, Procedure(s), & Referral(s) Ordered: No orders of the defined types were placed in this encounter.  Imaging & Referral(s) Ordered: None  Interventional Therapies: Scheduled:   None at this time.    Considering:   Possible celiac plexus block under fluoroscopic guidance and IV sedation.     PRN Procedures:   None at this time.    Requested PM Follow-up: Return in about 2 weeks (around 05/04/2016) for Med-Mgmt.  Future Appointments Date Time Provider Department Center  06/09/2016 7:45 AM Delano Metz, MD [facility] None    Primary Care Physician: Filbert Berthold, NP Location: [facility] Note by: Sydnee Levans. Laban Emperor, M.D, DABA, DABAPM, DABPM, DABIPP, FIPP  Pain Score Disclaimer: We use the NRS-11 scale. This is a self-reported, subjective measurement of pain severity with only modest accuracy. It is used primarily to identify  changes within a particular patient. It must be understood that outpatient pain scales are significantly less accurate that those used for research, where they can be applied under ideal controlled circumstances with minimal exposure to variables. In reality, the score is likely to be a combination of pain intensity and pain affect, where pain affect describes the degree of emotional arousal or changes in action readiness caused by the sensory experience of pain. Factors such as social and work situation, setting, emotional state, anxiety levels, expectation, and prior pain experience may influence pain perception and show large inter-individual differences that may also be affected by time variables.  Patient instructions provided during this appointment: Patient Instructions   Initial Gabapentin Titration  Medication used: Gabapentin (Generic Name) or Neurontin (Brand Name) 100 mg tablets/capsules  Reasons to stop increasing the dose:  Reason 1: You get good relief of symptoms, in which case there is no need to increase the daily dose any further.    Reason 2: You develop some side effects, such as sleeping all of the time, difficulty concentrating, or becoming disoriented, in which case you need to go down on the dose, to the prior level, where you were not experiencing any side effects. Stay on that dose longer, to allow more time for your body to get use it, before attempting to increase it again.   Steps: Step 1: Start by taking 1 (one) tablet at bedtime x 7 (seven) days.  Step 2: After being on 1 (one) tablet for 7 (seven) days, then increase it to 2 (two) tablets at bedtime for another 7 (seven) days.  Step 3: Next, after being on 2 (two) tablets at bedtime for 7 (seven) days, then increase it to 3 (three) tablets at bedtime, and stay on that dose until you see your doctor.  Reasons to stop increasing the dose: Reason 1: You get good relief of symptoms, in which case there is no need  to increase the daily dose any further.  Reason 2: You develop some side effects, such as sleeping  all of the time, difficulty concentrating, or becoming disoriented, in which case you need to go down on the dose, to the prior level, where you were not experiencing any side effects. Stay on that dose longer, to allow more time for your body to get use it, before attempting to increase it again.  Endpoint: Once you have reached the maximum dose you can tolerate without side-effects, contact your physician so as to evaluate the results of the regimen.   Questions: Feel free to contact us for any questions or problems at [phone]

## 2016-04-21 ENCOUNTER — Telehealth: Payer: Self-pay

## 2016-04-21 NOTE — Telephone Encounter (Signed)
She has questions regarding how to take her medicine. She says the script is not clear. Please call her

## 2016-04-22 NOTE — Telephone Encounter (Signed)
Patient had question about Gabapentin dosing.  Instructed patient to start with 1 capsule at bedtime for 7 days, if tolerating then may increase to 2 capsules at bedtime for 7 days, if tolerating may increase to 3 capsules at bedtime until returns for next appointment. Patient able to teach back 3.

## 2016-05-13 IMAGING — NM NM RENAL IMAG WO/W PHARM
4 series · 20 of 20 positions shown · non-contrast
Comparison: Ultrasound 09/21/2015.

CLINICAL DATA: Evaluate left-sided hydronephrosis.

EXAM:
NUCLEAR MEDICINE RENAL SCAN WITH DIURETIC ADMINISTRATION
TECHNIQUE: Radionuclide angiographic and sequential renal images were obtained
after intravenous injection of radiopharmaceutical. Imaging was
continued during slow intravenous injection of Lasix approximately
15 minutes after the start of the examination.
RADIOPHARMACEUTICALS:  5.38  mCi 6echnetium-GGm MAG3 IV

[Series 1000: lasix renal mag 3 · 7.79mm/px · 6 of 114 frames shown]
[frame 10/114]
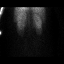
[frame 29/114]
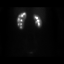
[frame 48/114]
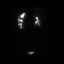
[frame 67/114]
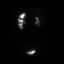
[frame 86/114]
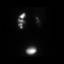
[frame 105/114]
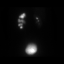

[Series 1000: renal statics · 2.40mm/px · 2 of 2 slices shown]
[im 1/2]
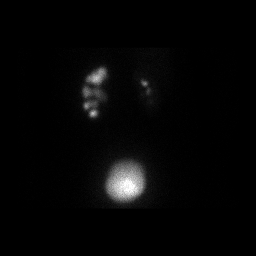
[im 2/2]
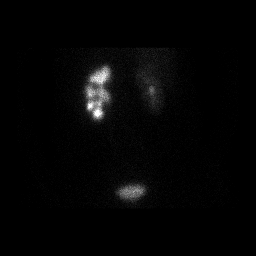

[Series 1000: lasix renal mag 3 (results) · 7.79mm/px · 6 of 114 frames shown]
[frame 10/114]
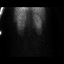
[frame 29/114]
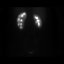
[frame 48/114]
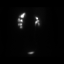
[frame 67/114]
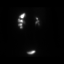
[frame 86/114]
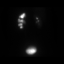
[frame 105/114]
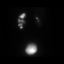

[Series 1000: lasix renal mag 3 (first dynamic renal results) · 7.79mm/px · 6 of 114 frames shown]
[frame 10/114]
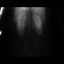
[frame 29/114]
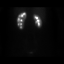
[frame 48/114]
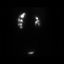
[frame 67/114]
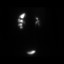
[frame 86/114]
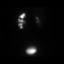
[frame 105/114]
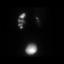

[20 of 20 positions shown; findings below may reference images not displayed]

FINDINGS: Flow:  Prompt symmetric arterial flow to the kidneys.

Left renogram: The left kidney is enlarged and hydronephrotic. There
is symmetric cortical uptake and excretion of the
radiopharmaceutical. Diminished clearance of the radiopharmaceutical
from the dilated right renal collecting system is identified.

Right renogram: Normal cortical uptake, excretion in clearance of
the radiopharmaceutical.

Differential:

Left kidney = 60 %

Right kidney = 40 %

T1/2 post Lasix :

Left kidney = approximately 14 min

Right kidney = approximately 5 min
IMPRESSION: 1. Findings are compatible with low grade partial obstruction of the
left kidney.
2. Normally functioning right kidney.

## 2016-06-09 ENCOUNTER — Ambulatory Visit: Payer: BLUE CROSS/BLUE SHIELD | Admitting: Pain Medicine

## 2016-06-16 ENCOUNTER — Telehealth: Payer: Self-pay | Admitting: Family Medicine

## 2016-06-16 DIAGNOSIS — G4762 Sleep related leg cramps: Secondary | ICD-10-CM

## 2016-06-16 NOTE — Telephone Encounter (Signed)
Pt. spouse called requesting a refill on Flexeril states that pt have 1 refill but can not fill until Dec 13 th,  Pt. Spouse states that  Pt sometime take 2 pills a night  That why she have ran out be time to refill. Spouse was thinking if order can be rewritten for

## 2016-06-17 MED ORDER — CYCLOBENZAPRINE HCL 10 MG PO TABS: 10.0000 mg | ORAL_TABLET | Freq: Every evening | ORAL | 5 refills | Status: DC | PRN

## 2016-06-17 NOTE — Telephone Encounter (Signed)
Refilled Flexeril 10mg  tabs take 1-2 at bedtime PRN cramps / pain, if she is tolerating 2 pills at night, then this is okay, however can increase sedation side effect. Follow-up as needed.

## 2016-08-01 ENCOUNTER — Other Ambulatory Visit: Payer: Self-pay | Admitting: Family Medicine

## 2016-08-01 IMAGING — US US RENAL
1 series · 14 of 25 positions shown · non-contrast
Comparison: CT 07/07/2015

CLINICAL DATA: Left flank pain 6 months.

EXAM:
RENAL / URINARY TRACT ULTRASOUND COMPLETE

[Series 1: us renal · 0.21mm/px · 14 of 62 slices shown]
[im 1/62]
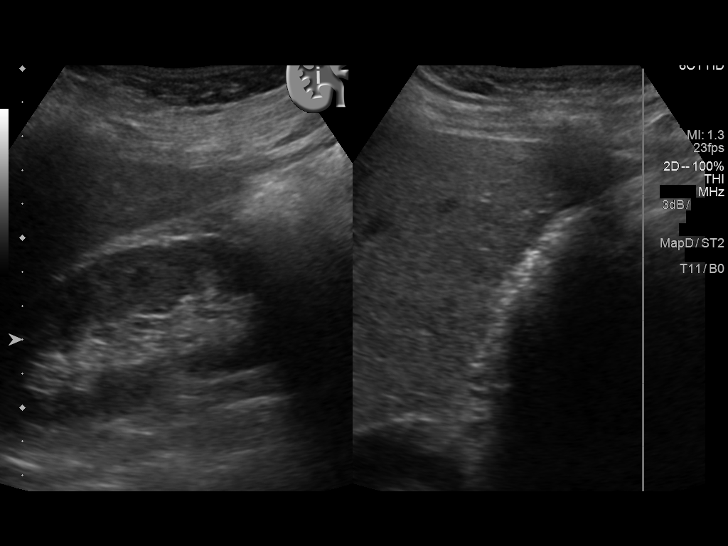
[im 6/62]
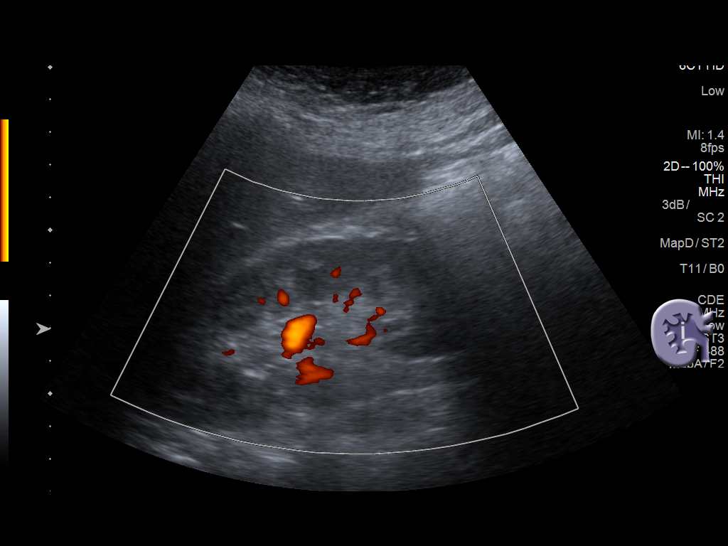
[im 11/62]
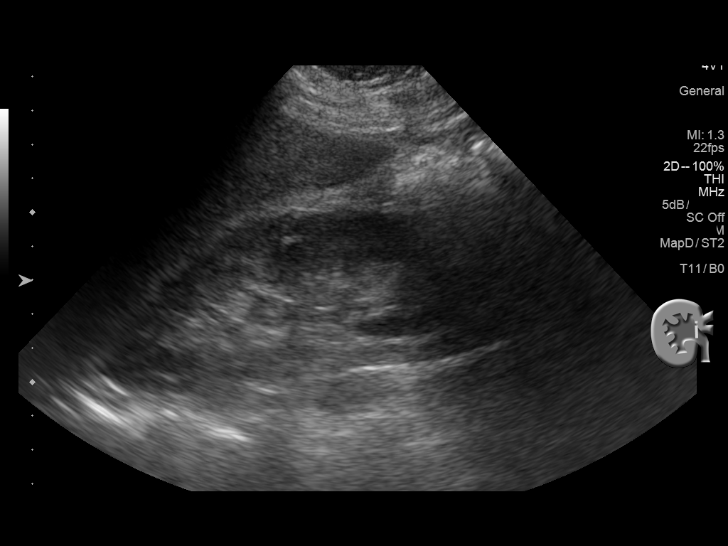
[im 16/62]
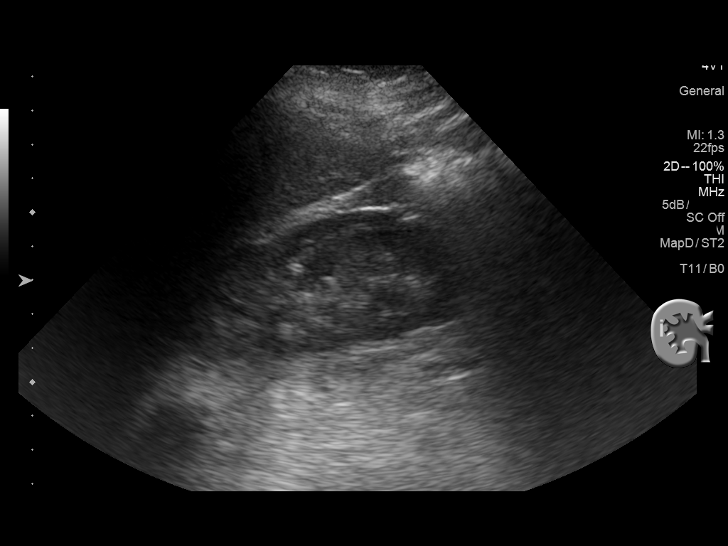
[im 21/62]
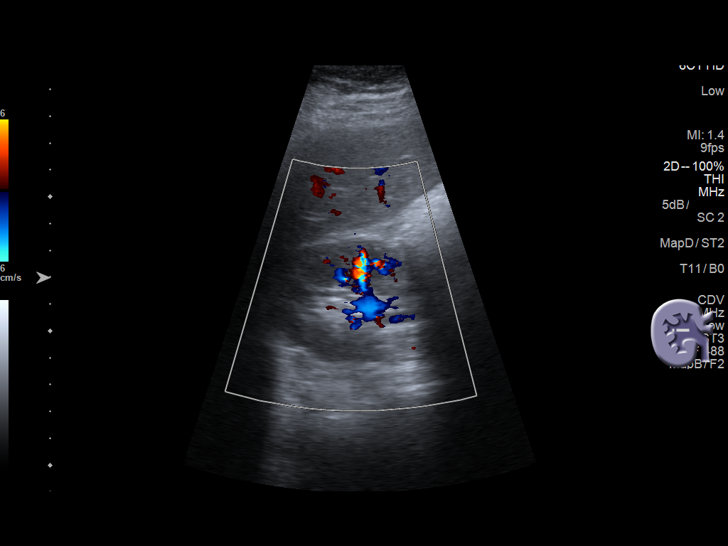
[im 23/62]
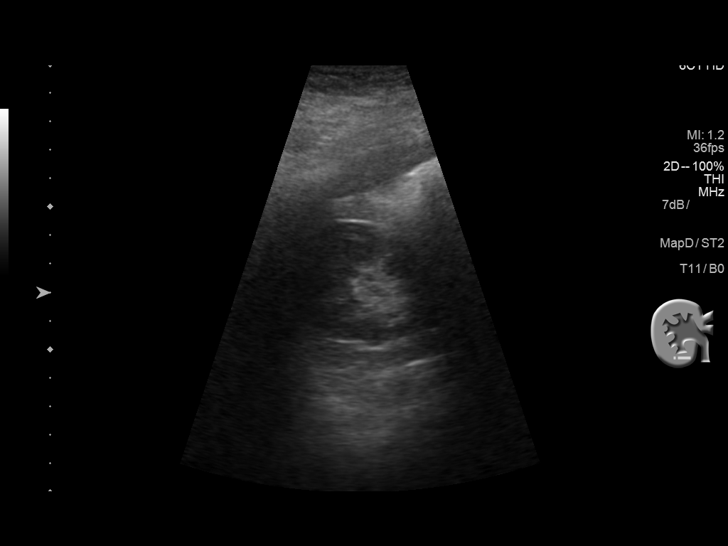
[im 28/62]
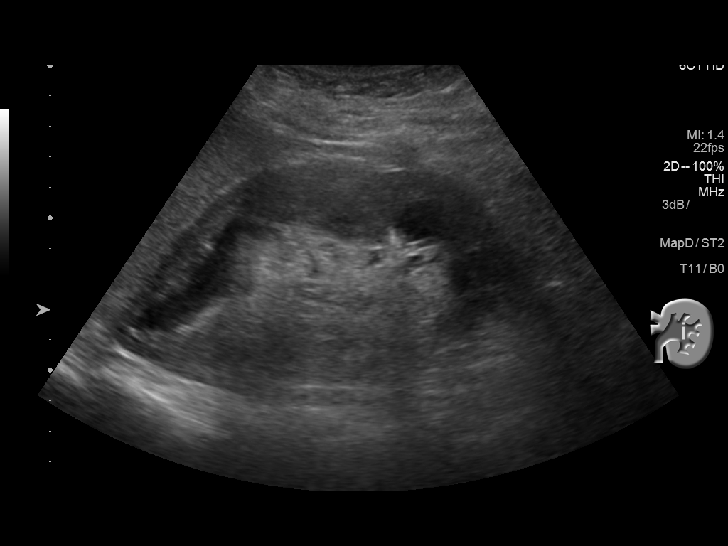
[im 34/62]
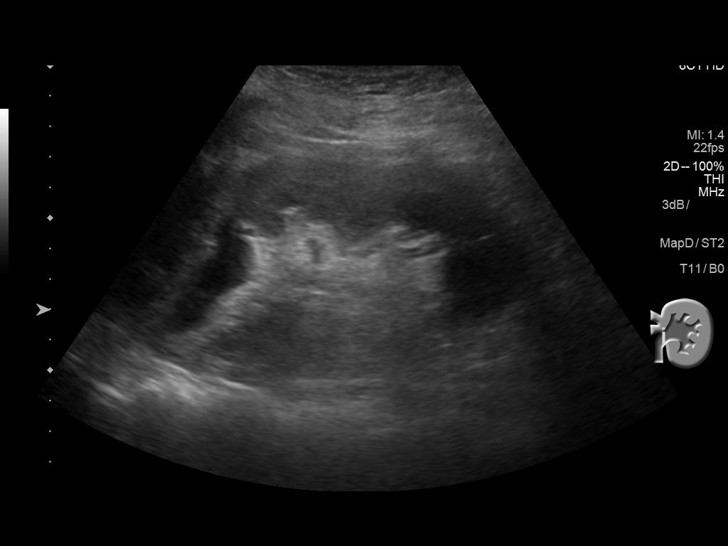
[im 39/62]
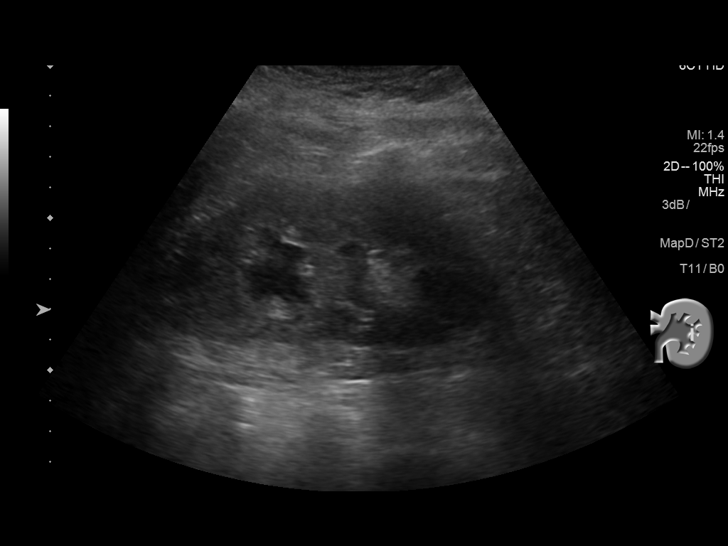
[im 41/62]
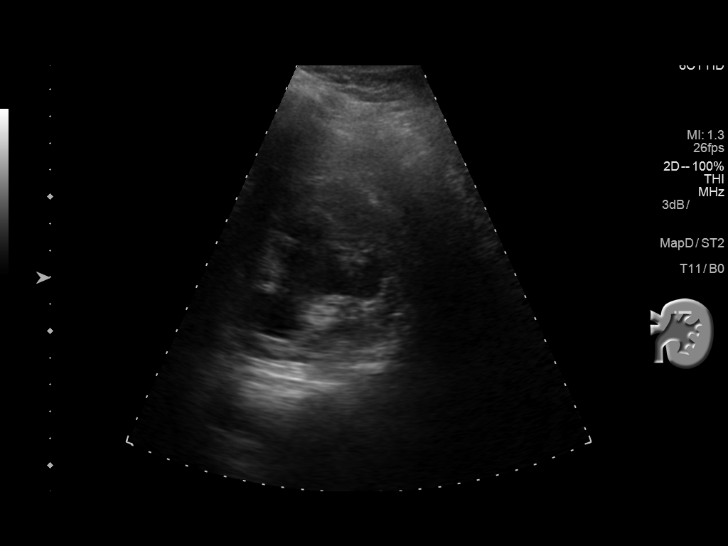
[im 46/62]
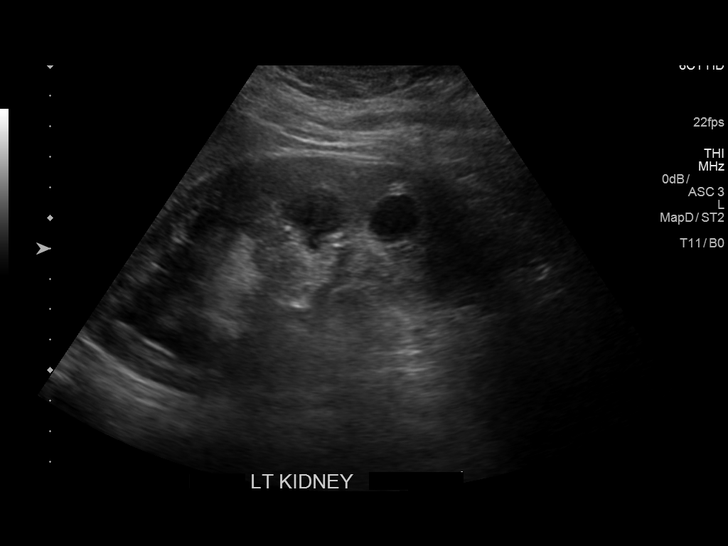
[im 51/62]
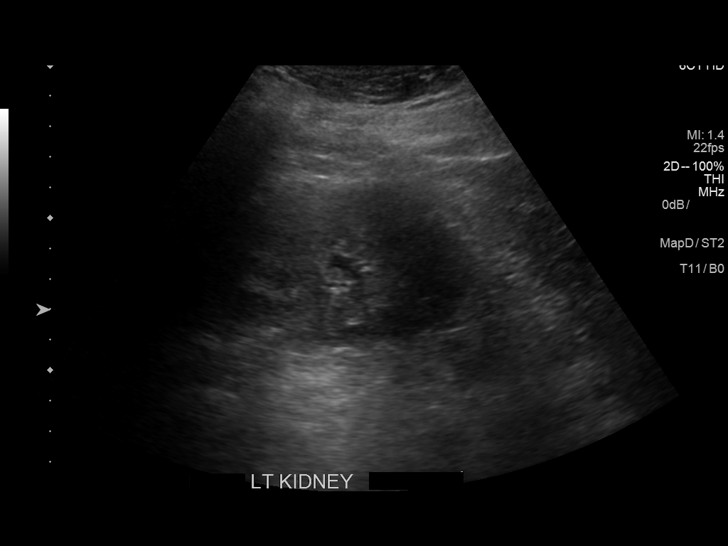
[im 56/62]
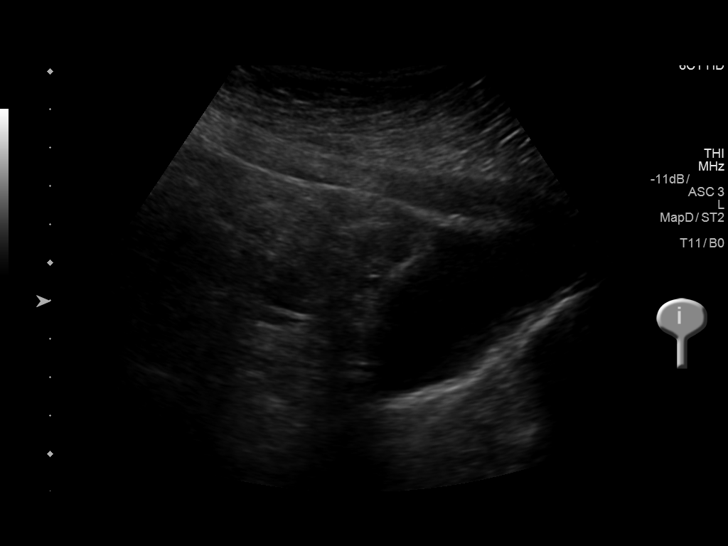
[im 62/62]
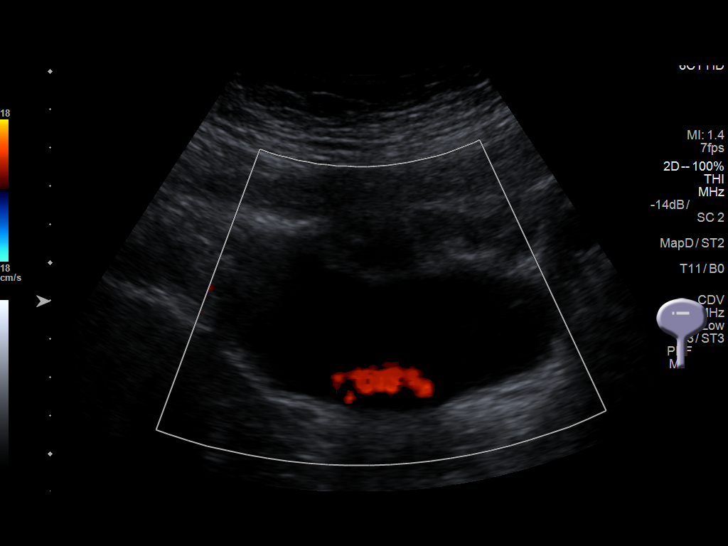

[14 of 25 positions shown; findings below may reference images not displayed]

FINDINGS: Right Kidney:

Length: 12.3 cm. Echogenicity within normal limits. No mass or
hydronephrosis visualized.

Left Kidney:

Length: 13.4 cm. Echogenicity within normal limits. Mild chronic
left hydronephrosis with dilated calices versus cysts over the
corticomedullary junctions.

Bladder:

Appears normal for degree of bladder distention. Normal bilateral
ureteral jets.
IMPRESSION: Normal size kidneys.  Chronic left hydronephrosis.

## 2016-08-01 MED ORDER — ACYCLOVIR 400 MG PO TABS: 400.0000 mg | ORAL_TABLET | Freq: Two times a day (BID) | ORAL | 5 refills | Status: DC

## 2016-09-12 ENCOUNTER — Encounter: Payer: Self-pay | Admitting: Emergency Medicine

## 2016-09-12 ENCOUNTER — Emergency Department
Admission: EM | Admit: 2016-09-12 | Discharge: 2016-09-12 | Disposition: A | Discharge status: Home / Self Care | Payer: BLUE CROSS/BLUE SHIELD | Attending: Emergency Medicine | Admitting: Emergency Medicine

## 2016-09-12 ENCOUNTER — Emergency Department: Payer: BLUE CROSS/BLUE SHIELD

## 2016-09-12 DIAGNOSIS — Z79899 Other long term (current) drug therapy: Secondary | ICD-10-CM | POA: Diagnosis not present

## 2016-09-12 DIAGNOSIS — R109 Unspecified abdominal pain: Secondary | ICD-10-CM

## 2016-09-12 DIAGNOSIS — N200 Calculus of kidney: Secondary | ICD-10-CM | POA: Insufficient documentation

## 2016-09-12 DIAGNOSIS — R1032 Left lower quadrant pain: Secondary | ICD-10-CM | POA: Diagnosis present

## 2016-09-12 LAB — CBC
HEMATOCRIT: 38.4 % (ref 35.0–47.0)
HEMOGLOBIN: 13.5 g/dL (ref 12.0–16.0)
MCH: 28.9 pg (ref 26.0–34.0)
MCHC: 35.1 g/dL (ref 32.0–36.0)
MCV: 82.4 fL (ref 80.0–100.0)
Platelets: 218 10*3/uL (ref 150–440)
RBC: 4.66 MIL/uL (ref 3.80–5.20)
RDW: 12.5 % (ref 11.5–14.5)
WBC: 7 10*3/uL (ref 3.6–11.0)

## 2016-09-12 LAB — URINALYSIS, COMPLETE (UACMP) WITH MICROSCOPIC
Bacteria, UA: NONE SEEN
Bilirubin Urine: NEGATIVE
GLUCOSE, UA: NEGATIVE mg/dL
Hgb urine dipstick: NEGATIVE
Ketones, ur: NEGATIVE mg/dL
Leukocytes, UA: NEGATIVE
Nitrite: NEGATIVE
PH: 6 (ref 5.0–8.0)
PROTEIN: NEGATIVE mg/dL
RBC / HPF: NONE SEEN RBC/hpf (ref 0–5)
SPECIFIC GRAVITY, URINE: 1.004 — AB (ref 1.005–1.030)
WBC, UA: NONE SEEN WBC/hpf (ref 0–5)

## 2016-09-12 LAB — BASIC METABOLIC PANEL
ANION GAP: 6 (ref 5–15)
BUN: 9 mg/dL (ref 6–20)
CHLORIDE: 105 mmol/L (ref 101–111)
CO2: 27 mmol/L (ref 22–32)
Calcium: 9 mg/dL (ref 8.9–10.3)
Creatinine, Ser: 0.59 mg/dL (ref 0.44–1.00)
GFR calc Af Amer: >60 mL/min (ref >60)
Glucose, Bld: 107 mg/dL — ABNORMAL HIGH (ref 65–99)
POTASSIUM: 4 mmol/L (ref 3.5–5.1)
Sodium: 138 mmol/L (ref 135–145)

## 2016-09-12 MED ORDER — ONDANSETRON 4 MG PO TBDP: 4.0000 mg | ORAL_TABLET | Freq: Three times a day (TID) | ORAL | 0 refills | Status: DC | PRN

## 2016-09-12 MED ORDER — ONDANSETRON HCL 4 MG/2ML IJ SOLN
4.0000 mg | Freq: Once | INTRAMUSCULAR | Status: AC
  Administered 2016-09-12: 4 mg via INTRAVENOUS
  Filled 2016-09-12: qty 2

## 2016-09-12 MED ORDER — MORPHINE SULFATE (PF) 4 MG/ML IV SOLN
4.0000 mg | Freq: Once | INTRAVENOUS | Status: AC
  Administered 2016-09-12: 4 mg via INTRAVENOUS
  Filled 2016-09-12: qty 1

## 2016-09-12 MED ORDER — OXYCODONE-ACETAMINOPHEN 5-325 MG PO TABS: 1.0000 | ORAL_TABLET | Freq: Four times a day (QID) | ORAL | 0 refills | Status: DC | PRN

## 2016-09-12 NOTE — ED Provider Notes (Signed)
[facility] Emergency Department Provider Note  ____________________________________________  Time seen: Approximately 6:22 PM  I have reviewed the triage vital signs and the nursing notes.   HISTORY  Chief Complaint Flank Pain and Rectal Pain    HPI Katelyn Holland is a 41 y.o. female who complains of left flank pain and suprapubic pressure for the past 2 days. Feels like prior kidney stones. Has a history of left-sided kidney problems including emphysematous pyelonephritis of the left kidney. Followed by Dr. Apolinar Junes of urology. No chest pain shortness of breath fever chills. No vomiting. No diarrhea. No dysuria. On Bactrim Chronically for UTI prophylaxis.     Past Medical History:  Diagnosis Date  . Arthritis   . Chronic constipation   . Gravida 1 para 1   . Kidney stone   . Kidney stones      Patient Active Problem List   Diagnosis Date Noted  . Nocturnal leg cramps 02/12/2016  . Current low back pain (Location of Primary Source of Pain) (Left) 02/11/2016  . Chronic flank pain (Left) 02/11/2016  . Encounter for pain management planning 02/11/2016  . Chronic left lower quadrant pain 02/11/2016  . Chronic pain 02/10/2016  . Long term current use of opiate analgesic 02/10/2016  . Long term prescription opiate use 02/10/2016  . Opiate use 02/10/2016  . Chronic Hydronephrosis of kidney (Left) 07/09/2015  . 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 Hypokalemia 07/09/2015  . Anemia 07/09/2015  . 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 Chronic nephrolithiasis (Left) 07/07/2015  . Chronic constipation 07/07/2015  . Family history of heart disease in female family member before age 1 12/24/2014  . Low libido 12/24/2014  . Morbid obesity (HCC) 04/10/2014     Past Surgical History:  Procedure Laterality Date  . ABDOMINAL HYSTERECTOMY    . APPENDECTOMY    . BACK SURGERY     x 2  . KIDNEY STONE SURGERY Left 2000   surgery  through back  . LAPAROSCOPIC GASTRIC SLEEVE RESECTION  2016  . LITHOTRIPSY  2007  . NEPHROLITHOTOMY Left 07/23/2015   Procedure: NEPHROLITHOTOMY PERCUTANEOUS;  Surgeon: Vanna Scotland, MD;  Location: [facility] ORS;  Service: Urology;  Laterality: Left;     Prior to Admission medications   Medication Sig Start Date End Date Taking? Authorizing Provider  acyclovir (ZOVIRAX) 400 MG tablet Take 1 tablet (400 mg total) by mouth 2 (two) times daily. 08/01/16   Smitty Cords, DO  cyclobenzaprine (FLEXERIL) 10 MG tablet Take 1-2 tablets (10-20 mg total) by mouth at bedtime as needed for muscle spasms. 06/17/16   Smitty Cords, DO  gabapentin (NEURONTIN) 100 MG capsule Take 1-3 capsules (100-300 mg total) by mouth at bedtime. Follow titration schedule. 04/20/16 05/20/16  Delano Metz, MD  ondansetron (ZOFRAN ODT) 4 MG disintegrating tablet Take 1 tablet (4 mg total) by mouth every 8 (eight) hours as needed for nausea or vomiting. 09/12/16   Sharman Cheek, MD  oxyCODONE-acetaminophen (ROXICET) 5-325 MG tablet Take 1 tablet by mouth every 6 (six) hours as needed for severe pain. 09/12/16   Sharman Cheek, MD  sulfamethoxazole-trimethoprim (BACTRIM) 400-80 MG tablet Take 1 tablet by mouth daily. 02/05/16   Vanna Scotland, MD     Allergies Dilaudid  [hydromorphone hcl] and Iodinated diagnostic agents   Family History  Problem Relation Age of Onset  . Heart disease Mother 36  . Cancer Maternal Grandmother   . Heart disease Father   . Kidney cancer Neg Hx   .  Bladder Cancer Neg Hx   . Prostate cancer Neg Hx     Social History Social History  Substance Use Topics  . Smoking status: Never Smoker  . Smokeless tobacco: Never Used  . Alcohol use 0.0 - 0.6 oz/week     Comment: occasional 1 every couple of months    Review of Systems  Constitutional:   No fever or chills.  ENT:   No sore throat. No rhinorrhea. Cardiovascular:   No chest pain. Respiratory:   No dyspnea or  cough. Gastrointestinal:   Negative for abdominal pain, vomiting and diarrhea.  Genitourinary:   Negative for dysuria or difficulty urinating. Musculoskeletal:   Negative for focal pain or swelling. Positive left flank pain Neurological:   Negative for headaches 10-point ROS otherwise negative.  ____________________________________________   PHYSICAL EXAM:  VITAL SIGNS: ED Triage Vitals [09/12/16 1549]  Enc Vitals Group     BP 117/70     Pulse Rate 85     Resp 18     Temp 99.3 F (37.4 C)     Temp Source Oral     SpO2 99 %     Weight 150 lb (68 kg)     Height 5\' 5"  (1.651 m)     Head Circumference      Peak Flow      Pain Score 5     Pain Loc      Pain Edu?      Excl. in GC?     Vital signs reviewed, nursing assessments reviewed.   Constitutional:   Alert and oriented. Well appearing and in no distress. Eyes:   No scleral icterus. No conjunctival pallor. PERRL. EOMI.  No nystagmus. ENT   Head:   Normocephalic and atraumatic.   Nose:   No congestion/rhinnorhea. No septal hematoma   Mouth/Throat:   MMM, no pharyngeal erythema. No peritonsillar mass.    Neck:   No stridor. No SubQ emphysema. No meningismus. Hematological/Lymphatic/Immunilogical:   No cervical lymphadenopathy. Cardiovascular:   RRR. Symmetric bilateral radial and DP pulses.  No murmurs.  Respiratory:   Normal respiratory effort without tachypnea nor retractions. Breath sounds are clear and equal bilaterally. No wheezes/rales/rhonchi. Gastrointestinal:   Soft and nontender. Non distended. There is no CVA tenderness.  No rebound, rigidity, or guarding. Genitourinary:   deferred Musculoskeletal:   Normal range of motion in all extremities. No joint effusions.  No lower extremity tenderness.  No edema. Neurologic:   Normal speech and language.  CN 2-10 normal. Motor grossly intact. No gross focal neurologic deficits are appreciated.  Skin:    Skin is warm, dry and intact. No rash noted.  No  petechiae, purpura, or bullae.  ____________________________________________    LABS (pertinent positives/negatives) (all labs ordered are listed, but only abnormal results are displayed) Labs Reviewed  URINALYSIS, COMPLETE (UACMP) WITH MICROSCOPIC - Abnormal; Notable for the following:       Result Value   Color, Urine STRAW (*)    APPearance CLEAR (*)    Specific Gravity, Urine 1.004 (*)    Squamous Epithelial / LPF 0-5 (*)    All other components within normal limits  BASIC METABOLIC PANEL - Abnormal; Notable for the following:    Glucose, Bld 107 (*)    All other components within normal limits  CBC   ____________________________________________   EKG    ____________________________________________    RADIOLOGY  Ct Renal Stone Study  Result Date: 09/12/2016 CLINICAL DATA:  Left flank pain, suprapubic pain EXAM:  CT ABDOMEN AND PELVIS WITHOUT CONTRAST TECHNIQUE: Multidetector CT imaging of the abdomen and pelvis was performed following the standard protocol without IV contrast. COMPARISON:  CT abdomen 07/07/2015 FINDINGS: Lower chest: No acute abnormality. Hepatobiliary: No focal liver abnormality is seen. No gallstones, gallbladder wall thickening, or biliary dilatation. Pancreas: Unremarkable. No pancreatic ductal dilatation or surrounding inflammatory changes. Spleen: Normal in size without focal abnormality. Adrenals/Urinary Tract: Normal adrenal glands. Normal right kidney. No right urolithiasis or hydronephrosis. Severe left hydronephrosis with a relatively normal caliber left ureter. Nonobstructing left nephrolithiasis. Stomach/Bowel: Prior gastric bypass. No bowel dilatation or bowel wall thickening to suggest obstruction. Moderate amount of stool in the ascending colon. No pneumatosis, pneumoperitoneum or portal venous gas. Vascular/Lymphatic: No significant vascular findings are present. No enlarged abdominal or pelvic lymph nodes. Reproductive: Status post hysterectomy.  No adnexal masses. Other: No abdominal wall hernia or abnormality. No abdominopelvic ascites. Musculoskeletal: No acute osseous abnormality. No lytic or sclerotic osseous lesion. Degenerative disc disease with disc height loss at L4-5. Broad-based disc osteophyte complex at L4-5 with severe bilateral facet arthropathy. IMPRESSION: 1. Chronic severe left hydronephrosis with nonobstructing left nephrolithiasis. Electronically Signed   By: Elige KoHetal  Patel   On: 09/12/2016 18:09    ____________________________________________   PROCEDURES Procedures  ____________________________________________   INITIAL IMPRESSION / ASSESSMENT AND PLAN / ED COURSE  Pertinent labs & imaging results that were available during my care of the patient were reviewed by me and considered in my medical decision making (see chart for details).  Patient presents with left flank pain consistent with renal colic she's had in the past. CT doesn't show any obstructing stones but she does have some kidney stones within the parenchyma. Chronic hydronephrosis, no evidence of ureteral obstruction. No evidence of urinary tract infection. Vital signs unremarkable. Labs unremarkable unremarkable. We'll discharge home to follow up with Dr. Apolinar JunesBrandon. Percocet for pain.         ____________________________________________   FINAL CLINICAL IMPRESSION(S) / ED DIAGNOSES  Final diagnoses:  Left flank pain  Kidney stone      New Prescriptions   ONDANSETRON (ZOFRAN ODT) 4 MG DISINTEGRATING TABLET    Take 1 tablet (4 mg total) by mouth every 8 (eight) hours as needed for nausea or vomiting.   OXYCODONE-ACETAMINOPHEN (ROXICET) 5-325 MG TABLET    Take 1 tablet by mouth every 6 (six) hours as needed for severe pain.     Portions of this note were generated with dragon dictation software. Dictation errors may occur despite best attempts at proofreading.    Sharman CheekPhillip Rosabelle Jupin, MD 09/12/16 [phone]

## 2016-09-12 NOTE — Discharge Instructions (Signed)
Your lab tests and CT scan do not show any acute issues today.  You have hydronephrosis of the left kidney but no signs of obstructing stones.  There are kidney stones within the kidney itself.

## 2016-09-12 NOTE — ED Triage Notes (Signed)
Pt presents with left sided flank pain and pelvic pressure. She states she has hx of kidney infections and kidney stones. Pt is on prophylactic antibiotics. Pt alert & oriented with NAD noted.

## 2016-09-12 NOTE — ED Notes (Signed)
Patient transported to CT 

## 2016-09-16 ENCOUNTER — Encounter: Payer: Self-pay | Admitting: Urology

## 2016-09-16 ENCOUNTER — Ambulatory Visit: Payer: BLUE CROSS/BLUE SHIELD | Admitting: Urology

## 2016-09-16 VITALS — BP 149/82 | HR 128 | Ht 65.0 in | Wt 178.0 lb

## 2016-09-16 DIAGNOSIS — N1330 Unspecified hydronephrosis: Secondary | ICD-10-CM

## 2016-09-16 DIAGNOSIS — R109 Unspecified abdominal pain: Secondary | ICD-10-CM

## 2016-09-16 DIAGNOSIS — N200 Calculus of kidney: Secondary | ICD-10-CM | POA: Diagnosis not present

## 2016-09-16 MED ORDER — TRIMETHOPRIM 100 MG PO TABS: 100.0000 mg | ORAL_TABLET | Freq: Every day | ORAL | 11 refills | Status: DC

## 2016-09-16 NOTE — Patient Instructions (Signed)
-   Start daily probiotic

## 2016-09-16 NOTE — Progress Notes (Signed)
09/16/2016 3:18 PM   Katelyn Holland 09/10/1975 [id number]  Referring provider: No referring provider defined for this encounter.  Chief Complaint  Patient presents with  . Nephrolithiasis    ER follow up    HPI: 41 year old female with chronic left hydronephrosis, flank pain who presents after recent ER visit for pain exacerbation.  She has a complicated history of left-sided recurrent stones, chronic flank pain, and recurrent pyelonephritis/urinary tract infections/ emphysematous pyelitis.  She is undergone multiple operative procedures on her left kidney including ESWL, ureteroscopy, PCNL x multiple most recently 08/21/2015.  She has been followed in the past by Dr. Orson Slick, Dr. Achilles Dunk, Dr. Bryson Ha, and most recently myself.  This is been going on for approximately 15 or more years.    She is a known chronically hydronephrotic left kidney which she reports is secondary to a large obstructing "goose egg" sized calculus and was told that her "kidney formed around the stone".  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  Several studies were suggestive of a left UPJ obstruction, however, she said several Lasix renal scans without evidence of obstruction.    She's had a metabolic workups in the past and at one point was on chlorthalidone 25 mg every other day as well as Polycitra-K. Despite this, she continued to make stones only on the left side.  She's never had a right-sided stone.  Lasix renal scan 3/ 2017 demonstrates the left kidney with approximate 40% function is slightly delayed nephrogram (T1/2 14 min) but not diagnostic for an obstruction.   She continues to have severe chronic left flank pain which has been present for many years.  This has improved with daily bactrim ppx over the past since 01/2016.  She has been intermittently stopping this medication due to recurrent yeast infections.  She is not currently taking a probiotic. When she resumes her Bactrim, her flank pain improves.  Most recently, she was in the emergency room on 09/12/2016 with left flank pain radiating to her suprapubic area on the left side.  Her urinalysis with time was unremarkable. CT scan was unremarkable, consistent with previous scans.  She had no leukocytosis and her creatinine was normal at its baseline.  Since her last visit, she did see a chronic pain specialist. Per her report, she was told that her back pain/flank pain was related to musculoskeletal issues and was offered injections which she refused. She has not been back to follow-up since.  PSH  significant for multiple abdominal surgeries including a laparoscopic gastric sleeve, open appendectomy, hysterectomy.  PMH: Past Medical History:  Diagnosis Date  . Arthritis   . Chronic constipation   . Gravida 1 para 1   . Kidney stone   . Kidney stones     Surgical History: Past Surgical History:  Procedure Laterality Date  . ABDOMINAL HYSTERECTOMY    . APPENDECTOMY    . BACK SURGERY     x 2  . KIDNEY STONE SURGERY Left 2000   surgery through back  . LAPAROSCOPIC GASTRIC SLEEVE RESECTION  2016  . LITHOTRIPSY  2007  . NEPHROLITHOTOMY Left 07/23/2015   Procedure: NEPHROLITHOTOMY PERCUTANEOUS;  Surgeon: Vanna Scotland, MD;  Location: [facility] ORS;  Service: Urology;  Laterality: Left;    Home Medications:  Allergies as of 09/16/2016      Reactions   Dilaudid  [hydromorphone Hcl] Itching   Uncoded Allergy. Allergen: Cleaster Corin  Iodinated Diagnostic Agents Nausea And Vomiting, Nausea Only   Other reaction(s): Dizziness Topical betadine is ok to use.      Medication List       Accurate as of 09/16/16  3:18 PM. Always use your most recent med list.          acyclovir 400 MG tablet Commonly known as:  ZOVIRAX Take 1 tablet (400 mg total) by mouth 2 (two) times daily.   cyclobenzaprine 10 MG tablet Commonly known as:  FLEXERIL Take  1-2 tablets (10-20 mg total) by mouth at bedtime as needed for muscle spasms.   gabapentin 100 MG capsule Commonly known as:  NEURONTIN Take 1-3 capsules (100-300 mg total) by mouth at bedtime. Follow titration schedule.   ondansetron 4 MG disintegrating tablet Commonly known as:  ZOFRAN ODT Take 1 tablet (4 mg total) by mouth every 8 (eight) hours as needed for nausea or vomiting.   oxyCODONE-acetaminophen 5-325 MG tablet Commonly known as:  ROXICET Take 1 tablet by mouth every 6 (six) hours as needed for severe pain.   trimethoprim 100 MG tablet Commonly known as:  TRIMPEX Take 1 tablet (100 mg total) by mouth daily.       Allergies:  Allergies  Allergen Reactions  . Dilaudid  [Hydromorphone Hcl] Itching    Uncoded Allergy. Allergen: DIULADID  . Iodinated Diagnostic Agents Nausea And Vomiting and Nausea Only    Other reaction(s): Dizziness Topical betadine is ok to use.    Family History: Family History  Problem Relation Age of Onset  . Heart disease Mother 1645  . Cancer Maternal Grandmother   . Heart disease Father   . Kidney cancer Neg Hx   . Bladder Cancer Neg Hx   . Prostate cancer Neg Hx     Social History:  reports that she has never smoked. She has never used smokeless tobacco. She reports that she drinks alcohol. She reports that she does not use drugs.  ROS: UROLOGY Frequent Urination?: No Hard to postpone urination?: No Burning/pain with urination?: Yes Get up at night to urinate?: No Leakage of urine?: No Urine stream starts and stops?: Yes Trouble starting stream?: No Do you have to strain to urinate?: No Blood in urine?: Yes Urinary tract infection?: No Sexually transmitted disease?: No Injury to kidneys or bladder?: No Painful intercourse?: No Weak stream?: No Currently pregnant?: No Vaginal bleeding?: No Last menstrual period?: n  Gastrointestinal Nausea?: Yes Vomiting?: No Indigestion/heartburn?: No Diarrhea?: No Constipation?:  No  Constitutional Fever: No Night sweats?: No Weight loss?: No Fatigue?: No  Skin Skin rash/lesions?: No Itching?: No  Eyes Blurred vision?: No Double vision?: No  Ears/Nose/Throat Sore throat?: No Sinus problems?: No  Hematologic/Lymphatic Swollen glands?: No Easy bruising?: No  Cardiovascular Leg swelling?: No Chest pain?: No  Respiratory Cough?: No Shortness of breath?: No  Endocrine Excessive thirst?: No  Musculoskeletal Back pain?: No Joint pain?: No  Neurological Headaches?: No Dizziness?: No  Psychologic Depression?: No Anxiety?: No  Physical Exam: BP (!) 149/82   Pulse (!) 128   Ht 5\' 5"  (1.651 m)   Wt 178 lb (80.7 kg)   BMI 29.62 kg/m   Constitutional:  Alert and oriented, No acute distress. HEENT: [place] AT, moist mucus membranes.  Trachea midline, no masses. Cardiovascular: No clubbing, cyanosis, or edema.Marland Kitchen. Respiratory: Normal respiratory effort, no increased work of breathing.   GI: Abdomen is soft, nondistended, no abdominal masses.   GU: No CVA tenderness.   Skin: No rashes, bruises or  suspicious lesions. Neurologic: Grossly intact, no focal deficits, moving all 4 extremities. Psychiatric: Normal mood and affect.  Tearful at times.  Laboratory Data:  Lab Results  Component Value Date   WBC 7.0 09/12/2016   HGB 13.5 09/12/2016   HCT 38.4 09/12/2016   MCV 82.4 09/12/2016   PLT 218 09/12/2016    Lab Results  Component Value Date   CREATININE 0.59 09/12/2016    Pertinent Imaging: CLINICAL DATA:  Left flank pain, suprapubic pain  EXAM: CT ABDOMEN AND PELVIS WITHOUT CONTRAST  TECHNIQUE: Multidetector CT imaging of the abdomen and pelvis was performed following the standard protocol without IV contrast.  COMPARISON:  CT abdomen 07/07/2015  FINDINGS: Lower chest: No acute abnormality.  Hepatobiliary: No focal liver abnormality is seen. No gallstones, gallbladder wall thickening, or biliary dilatation.  Pancreas:  Unremarkable. No pancreatic ductal dilatation or surrounding inflammatory changes.  Spleen: Normal in size without focal abnormality.  Adrenals/Urinary Tract: Normal adrenal glands. Normal right kidney. No right urolithiasis or hydronephrosis. Severe left hydronephrosis with a relatively normal caliber left ureter. Nonobstructing left nephrolithiasis.  Stomach/Bowel: Prior gastric bypass. No bowel dilatation or bowel wall thickening to suggest obstruction. Moderate amount of stool in the ascending colon. No pneumatosis, pneumoperitoneum or portal venous gas.  Vascular/Lymphatic: No significant vascular findings are present. No enlarged abdominal or pelvic lymph nodes.  Reproductive: Status post hysterectomy. No adnexal masses.  Other: No abdominal wall hernia or abnormality. No abdominopelvic ascites.  Musculoskeletal: No acute osseous abnormality. No lytic or sclerotic osseous lesion. Degenerative disc disease with disc height loss at L4-5. Broad-based disc osteophyte complex at L4-5 with severe bilateral facet arthropathy.  IMPRESSION: 1. Chronic severe left hydronephrosis with nonobstructing left nephrolithiasis.   Electronically Signed   By: Elige Ko   On: 09/12/2016 18:09    CT scan reviewed today personally and with the patient  Assessment & Plan:    1. Left flank pain Chronic left flank pain with recurrent stones, history of left empyematous pyelonephritis, and hydronephrosis x many years.  On CT scan, the left kidney appears grossly abnormal with somewhat of an XGP appearance.    In the past, we have had discussions in consideration of possible left nephrectomy but  I continue to have some concerns about removing a functional kidney in a young adult.  Additionally, I have some concerns that her pain may not resolve with nephrectomy.   She is tearful today and continues to desire left nephrectomy.  Improvement in frequency of pain in severity with  suppressive antibiotics.  We discussed changing her antibiotic to trimethoprim only and I have recommended addition of probiotic to help reduce the number of yeast infections.  This point, I would like her to seek a second opinion, possibly at [facility] where she  was previously evaluated.  2. Hydronephrosis, left As above.  Equivocal for obstruction.  Longstanding without significant loss of function.    3. Recurrent kidney stones Left kidney only   Return in about 6 months (around 03/19/2017) for recheck kidney.   Vanna Scotland, MD    [facility] [address] [place], [place] [address] [phone]

## 2016-09-21 ENCOUNTER — Telehealth: Payer: Self-pay | Admitting: Urology

## 2016-09-21 NOTE — Telephone Encounter (Signed)
Please offer referral to [facility].  Vanna ScJeanes HospitalotlandAshley Desmond Szabo, MD

## 2016-09-21 NOTE — Telephone Encounter (Signed)
I faxed the referral to [facility] and was contacted back from a [name] and they do not except her insurance so they are unable to see her. The only way they will see her is if she wants to pay in full. I have tried to contact the patient but she does not have voicemail. I will try her again later.   Katelyn DusterMichelle

## 2016-10-16 ENCOUNTER — Encounter: Payer: Self-pay | Admitting: Emergency Medicine

## 2016-10-16 ENCOUNTER — Emergency Department
Admission: EM | Admit: 2016-10-16 | Discharge: 2016-10-16 | Disposition: A | Discharge status: Home / Self Care | Payer: BLUE CROSS/BLUE SHIELD | Attending: Emergency Medicine | Admitting: Emergency Medicine

## 2016-10-16 DIAGNOSIS — L259 Unspecified contact dermatitis, unspecified cause: Secondary | ICD-10-CM | POA: Insufficient documentation

## 2016-10-16 DIAGNOSIS — R21 Rash and other nonspecific skin eruption: Secondary | ICD-10-CM | POA: Diagnosis present

## 2016-10-16 MED ORDER — PREDNISONE 10 MG PO TABS: 10.0000 mg | ORAL_TABLET | Freq: Every day | ORAL | 0 refills | Status: DC

## 2016-10-16 NOTE — ED Notes (Signed)
Rash to right and left hand and left elbow, states she noticed it about 3-5 days ago.  Pt states she has hx of the same rash in the same place about a year ago. Pt is on long-term antibiotics for issues related to her kidney and were recently changed from Bactrim DS to Trimethoprim daily.  Pt thinks it could be related to her soap or laundry detergent.

## 2016-10-16 NOTE — ED Provider Notes (Signed)
[facility]-EMERGENCY DEPARTMENT Provider Note   CSN: [id number] Arrival date & time: 10/16/16  1037     History   Chief Complaint Chief Complaint  Patient presents with  . Rash    HPI Katelyn Holland is a 41 y.o. female presents to the emergency department for evaluation of a rash to bilateral elbows and hands. Rash occurred 7 days ago. She's been using hydrocortisone cream with minimal improvement. She denies any new drugs, medications, soaks, detergents. She had a similar episode that occurred a couple years ago that responded well to prednisone and Benadryl. She denies any chest pain, shortness of breath, difficulty swallowing. Rash began on her left elbow she describes it as burning and itching. She also has a rash to the right elbow that is also spread on the dorsal aspect of both hands. No other rash throughout the body.  HPI  Past Medical History:  Diagnosis Date  . Arthritis   . Chronic constipation   . Gravida 1 para 1   . Kidney stone   . Kidney stones     Patient Active Problem List   Diagnosis Date Noted  . Nocturnal leg cramps 02/12/2016  . Current low back pain (Location of Primary Source of Pain) (Left) 02/11/2016  . Chronic flank pain (Left) 02/11/2016  . Encounter for pain management planning 02/11/2016  . Chronic left lower quadrant pain 02/11/2016  . Chronic pain 02/10/2016  . Long term current use of opiate analgesic 02/10/2016  . Long term prescription opiate use 02/10/2016  . Opiate use 02/10/2016  . Chronic Hydronephrosis of kidney (Left) 07/09/2015  . 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 Hypokalemia 07/09/2015  . Anemia 07/09/2015  . 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 Chronic nephrolithiasis (Left) 07/07/2015  . Chronic constipation 07/07/2015  . Family history of heart disease in female family member before age 27 12/24/2014  . Low libido 12/24/2014  . Morbid obesity (HCC) 04/10/2014    Past Surgical History:    Procedure Laterality Date  . ABDOMINAL HYSTERECTOMY    . APPENDECTOMY    . BACK SURGERY     x 2  . KIDNEY STONE SURGERY Left 2000   surgery through back  . LAPAROSCOPIC GASTRIC SLEEVE RESECTION  2016  . LITHOTRIPSY  2007  . NEPHROLITHOTOMY Left 07/23/2015   Procedure: NEPHROLITHOTOMY PERCUTANEOUS;  Surgeon: Vanna Scotland, MD;  Location: [facility] ORS;  Service: Urology;  Laterality: Left;    OB History    No data available       Home Medications    Prior to Admission medications   Medication Sig Start Date End Date Taking? Authorizing Provider  acyclovir (ZOVIRAX) 400 MG tablet Take 1 tablet (400 mg total) by mouth 2 (two) times daily. 08/01/16   Smitty Cords, DO  cyclobenzaprine (FLEXERIL) 10 MG tablet Take 1-2 tablets (10-20 mg total) by mouth at bedtime as needed for muscle spasms. 06/17/16   Smitty Cords, DO  gabapentin (NEURONTIN) 100 MG capsule Take 1-3 capsules (100-300 mg total) by mouth at bedtime. Follow titration schedule. 04/20/16 05/20/16  Delano Metz, MD  ondansetron (ZOFRAN ODT) 4 MG disintegrating tablet Take 1 tablet (4 mg total) by mouth every 8 (eight) hours as needed for nausea or vomiting. 09/12/16   Sharman Cheek, MD  oxyCODONE-acetaminophen (ROXICET) 5-325 MG tablet Take 1 tablet by mouth every 6 (six) hours as needed for severe pain. 09/12/16   Sharman Cheek, MD  trimethoprim (TRIMPEX) 100 MG tablet Take 1 tablet (100 mg total) by  mouth daily. 09/16/16   Vanna Scotland, MD    Family History Family History  Problem Relation Age of Onset  . Heart disease Mother 6  . Cancer Maternal Grandmother   . Heart disease Father   . Kidney cancer Neg Hx   . Bladder Cancer Neg Hx   . Prostate cancer Neg Hx     Social History Social History  Substance Use Topics  . Smoking status: Never Smoker  . Smokeless tobacco: Never Used  . Alcohol use 0.0 - 0.6 oz/week     Comment: occasional 1 every couple of months     Allergies   Dilaudid   [hydromorphone hcl] and Iodinated diagnostic agents   Review of Systems Review of Systems  Constitutional: Negative for activity change, chills, fatigue and fever.  HENT: Negative for congestion, sinus pressure and sore throat.   Eyes: Negative for visual disturbance.  Respiratory: Negative for cough, chest tightness and shortness of breath.   Cardiovascular: Negative for chest pain and leg swelling.  Gastrointestinal: Negative for abdominal pain, diarrhea, nausea and vomiting.  Genitourinary: Negative for dysuria.  Musculoskeletal: Negative for arthralgias and gait problem.  Skin: Positive for rash.  Neurological: Negative for weakness, numbness and headaches.  Hematological: Negative for adenopathy.  Psychiatric/Behavioral: Negative for agitation, behavioral problems and confusion.     Physical Exam Updated Vital Signs BP 128/80 (BP Location: Left Arm)   Pulse 82   Temp 98 F (36.7 C) (Oral)   Resp 18   Ht  (1.626 m)   Wt 72.6 kg   SpO2 100%   BMI 27.46 kg/m   Physical Exam  Constitutional: She is oriented to person, place, and time. She appears well-developed and well-nourished. No distress.  HENT:  Head: Normocephalic and atraumatic.  Mouth/Throat: Oropharynx is clear and moist. No oropharyngeal exudate.  No signs of angioedema, pharyngeal swelling or uvular shift  Eyes: EOM are normal. Pupils are equal, round, and reactive to light. Right eye exhibits no discharge. Left eye exhibits no discharge.  Neck: Normal range of motion. Neck supple.  Cardiovascular: Normal rate, regular rhythm and intact distal pulses.   Pulmonary/Chest: Effort normal and breath sounds normal. No respiratory distress. She exhibits no tenderness.  Abdominal: Soft. She exhibits no distension. There is no tenderness.  Musculoskeletal: Normal range of motion. She exhibits no edema.  Neurological: She is alert and oriented to person, place, and time. She has normal reflexes.  Skin: Skin is  warm and dry.  Patient has a erythematous maculopapular rash to the left and right elbow with scattered papules along the dorsal aspect of the forearm and hands. Rash does not appear to be consistent with scabies, no centralized lesion within the papule. No interdigital rash. Rash consistent with contact dermatitis  Psychiatric: She has a normal mood and affect. Her behavior is normal. Thought content normal.     ED Treatments / Results  Labs (all labs ordered are listed, but only abnormal results are displayed) Labs Reviewed - No data to display  EKG  EKG Interpretation None       Radiology No results found.  Procedures Procedures (including critical care time)  Medications Ordered in ED Medications - No data to display   Initial Impression / Assessment and Plan / ED Course  I have reviewed the triage vital signs and the nursing notes.  Pertinent labs & imaging results that were available during my care of the patient were reviewed by me and considered in my medical decision  making (see chart for details).     41 year old female with rash to bilateral upper extremities. Rash consistent with contact dermatitis. Recommend topical calamine lotion, oral Benadryl, prednisone 6 day taper. Patient will follow-up with PCP. She is educated on signs and symptoms return to the clinic for.  Final Clinical Impressions(s) / ED Diagnoses   Final diagnoses:  Rash  Contact dermatitis, unspecified contact dermatitis type, unspecified trigger    New Prescriptions New Prescriptions   No medications on file     Evon Slack, PA-C 10/16/16 1238    Jene Every, MD 10/16/16 1242

## 2016-10-16 NOTE — ED Triage Notes (Signed)
Patient presents to the ED with bilateral hand rash and rash to left elbow.  Patient states she first noticed rash 3 days ago.  Patient states her PCP started her on a new antibiotic to prevent kidney infections (Thimethorprim) recently.  Patient denies pain but reports elbow occasionally burns.  Patient is in no obvious distress at this time.

## 2016-10-16 NOTE — Discharge Instructions (Signed)
Please use topical Benadryl or calamine lotion as needed. Take prednisone as prescribed. He may take oral Benadryl as needed. Follow-up with PCP if no improvement in 5-6 days. Return to the ER for any worsening symptoms or urgent changes in her health.

## 2016-10-16 NOTE — ED Notes (Signed)
Prescription called into [facility] in [place] due to prescription not being included in with discharge paperwork.  Patient notified that Rx was called into the pharmacy.

## 2017-01-09 ENCOUNTER — Ambulatory Visit (INDEPENDENT_AMBULATORY_CARE_PROVIDER_SITE_OTHER): Payer: BLUE CROSS/BLUE SHIELD | Admitting: Family Medicine

## 2017-01-09 ENCOUNTER — Encounter: Payer: Self-pay | Admitting: Family Medicine

## 2017-01-09 VITALS — BP 108/66 | HR 77 | Temp 98.2°F | Ht 65.0 in | Wt 181.4 lb

## 2017-01-09 DIAGNOSIS — Z90710 Acquired absence of both cervix and uterus: Secondary | ICD-10-CM | POA: Diagnosis not present

## 2017-01-09 DIAGNOSIS — N1330 Unspecified hydronephrosis: Secondary | ICD-10-CM | POA: Diagnosis not present

## 2017-01-09 DIAGNOSIS — T3695XA Adverse effect of unspecified systemic antibiotic, initial encounter: Principal | ICD-10-CM

## 2017-01-09 DIAGNOSIS — B379 Candidiasis, unspecified: Secondary | ICD-10-CM

## 2017-01-09 MED ORDER — FLUCONAZOLE 150 MG PO TABS: ORAL_TABLET | ORAL | 3 refills | Status: DC

## 2017-01-09 NOTE — Progress Notes (Signed)
Subjective:    Patient ID: Katelyn Holland, female    DOB: 1975-07-16, 41 y.o.   MRN: [id number]  Katelyn Holland is a 41 y.o. female presenting on 01/09/2017 for Vaginitis (possible yeast infection, vaginal discharge, pt current takes abx daily )  Patient presents for a same day appointment. Last visit with prior PCP Amy Krebs NP here at [facility] about 1 year ago 02/2016. Has not returned since, will plan to re-establish in future.  HPI   VAGINAL YEAST INFECTION / Secondary to chronic prophylactic antibiotic use - See below complex kidney history, requiring chronic antibiotics for prophylaxis. Previously taking Bactrim 400-80mg  daily, then in 09/2016 switched by her Urologist to Trimethoprim 100mg  daily only, has done well but now with yeast infection. 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 Has been on chronic antibiotic prophylaxis due to high risk. - Currently awaiting [facility] reconstructive surgery for kidney repair, pending apt  Social History  Substance Use Topics  . Smoking status: Never Smoker  . Smokeless tobacco: Never Used  . Alcohol use 0.0 - 0.6 oz/week     Comment: occasional 1 every couple of months    Review of Systems Per HPI unless specifically indicated above       Objective:    BP 108/66 (BP Location: Right Arm, Patient Position: Sitting, Cuff Size: Normal)   Pulse 77   Temp 98.2 F (36.8 C) (Oral)   Ht 5\' 5"  (1.651 m)   Wt 181 lb 6.4 oz (82.3 kg)   BMI 30.19 kg/m   Wt Readings from Last 3 Encounters:  01/09/17 181 lb 6.4 oz (82.3 kg)  10/16/16 160 lb (72.6 kg)  09/16/16 178 lb (80.7 kg)    Physical Exam  Constitutional: She is oriented to person, place, and time. She appears well-developed and well-nourished. No distress.  Well-appearing, comfortable, cooperative  HENT:  Head: Normocephalic and atraumatic.  Mouth/Throat: Oropharynx is clear and moist.  Eyes: Conjunctivae are normal. Right eye exhibits no discharge. Left eye exhibits no discharge.  Cardiovascular: Normal rate.   Pulmonary/Chest: Effort normal.  Genitourinary:  Genitourinary Comments: Deferred Pelvic Exam  Neurological: She is alert and oriented to person, place, and time.  Skin: Skin is warm and dry. No rash noted. She is not diaphoretic. No erythema.  Psychiatric: Her behavior is normal.  Nursing note and vitals reviewed.  Results for orders placed or performed during the hospital encounter of 09/12/16  Urinalysis, Complete w Microscopic  Result Value Ref Range   Color, Urine STRAW (A) YELLOW   APPearance CLEAR (A) CLEAR   Specific Gravity, Urine 1.004 (L) 1.005 - 1.030   pH 6.0 5.0 - 8.0   Glucose, UA NEGATIVE NEGATIVE mg/dL   Hgb urine dipstick NEGATIVE NEGATIVE   Bilirubin Urine NEGATIVE NEGATIVE   Ketones, ur NEGATIVE NEGATIVE mg/dL   Protein, ur NEGATIVE  NEGATIVE mg/dL   Nitrite NEGATIVE NEGATIVE   Leukocytes, UA NEGATIVE NEGATIVE   RBC / HPF NONE SEEN 0 - 5 RBC/hpf   WBC, UA NONE SEEN 0 - 5 WBC/hpf   Bacteria, UA NONE SEEN NONE SEEN   Squamous Epithelial / LPF 0-5 (A) NONE SEEN  Basic metabolic panel  Result Value Ref Range   Sodium 138 135 - 145 mmol/L   Potassium 4.0 3.5 - 5.1 mmol/L   Chloride 105 101 - 111 mmol/L   CO2 27 22 - 32 mmol/L    Glucose, Bld 107 (H) 65 - 99 mg/dL   BUN 9 6 - 20 mg/dL   Creatinine, Ser 1.61 0.44 - 1.00 mg/dL   Calcium 9.0 8.9 - 09.6 mg/dL   GFR calc non Af Amer >60 >60 mL/min   GFR calc Af Amer >60 >60 mL/min   Anion gap 6 5 - 15  CBC  Result Value Ref Range   WBC 7.0 3.6 - 11.0 K/uL   RBC 4.66 3.80 - 5.20 MIL/uL   Hemoglobin 13.5 12.0 - 16.0 g/dL   HCT 04.5 40.9 - 81.1 %   MCV 82.4 80.0 - 100.0 fL   MCH 28.9 26.0 - 34.0 pg   MCHC 35.1 32.0 - 36.0 g/dL   RDW 91.4 78.2 - 95.6 %   Platelets 218 150 - 440 K/uL      Assessment & Plan:   Problem List Items Addressed This Visit    Chronic Hydronephrosis of kidney (Left) (Chronic)    Followed by Urology ([facility]) No recent changes Continues on chronic antibiotic prophylaxis now Trimethoprim Pending [facility] surgical evaluation for possible reconstruction of L kidney       Other Visit Diagnoses    Antibiotic-induced yeast infection    -  Primary  Clinical yeast infection based on similar history. Deferred Pelvic exam. Secondary to chronic antibiotic prophylaxis with kidney disease. - Rx Diflucan x 2 doses, then +3 refills given for any recurrence - Return criteria given    Relevant Medications   fluconazole (DIFLUCAN) 150 MG tablet      Meds ordered this encounter  Medications  . Probiotic Product (PROBIOTIC-10 PO)    Sig: Take by mouth.  . fluconazole (DIFLUCAN) 150 MG tablet    Sig: Take one tablet by mouth on Day 1. Repeat dose 2nd tablet on Day 3.    Dispense:  2 tablet    Refill:  3      Follow up plan: Return in about 3 months (around 04/11/2017) for Annual Physical.   Recommend patient schedule with myself or Wilhelmina Mcardle, AGPCNP-BC and notify us in advance to order blood work before Annual Physical.  Saralyn Pilar, DO [facility] 01/09/2017, 6:09 PM

## 2017-01-09 NOTE — Patient Instructions (Addendum)
Thank you for coming to the clinic today.  1. Take Diflucan as prescribed for yeast infection due to chronic antibiotic use  If any significant change - urinary tract symptoms burning, urinary changes, fever/chills, rash.  Please schedule a Follow-up Appointment to: Return in about 3 months (around 04/11/2017) for Annual Physical.  If you have any other questions or concerns, please feel free to call the clinic or send a message through MyChart. You may also schedule an earlier appointment if necessary.  Additionally, you may be receiving a survey about your experience at our clinic within a few days to 1 week by e-mail or mail. We value your feedback.  Saralyn PilarAlexander Channie Bostick, DO [facility]

## 2017-01-09 NOTE — Assessment & Plan Note (Addendum)
[facility] No recent changes Continues on chronic antibiotic prophylaxis now Trimethoprim Pending [facility] surgical evaluation for possible reconstruction of L kidney

## 2017-01-12 ENCOUNTER — Other Ambulatory Visit: Payer: Self-pay

## 2017-01-12 DIAGNOSIS — G4762 Sleep related leg cramps: Secondary | ICD-10-CM

## 2017-01-12 MED ORDER — CYCLOBENZAPRINE HCL 10 MG PO TABS: 10.0000 mg | ORAL_TABLET | Freq: Every evening | ORAL | 5 refills | Status: DC | PRN

## 2017-03-17 ENCOUNTER — Encounter: Payer: Self-pay | Admitting: Urology

## 2017-03-17 ENCOUNTER — Ambulatory Visit: Payer: BLUE CROSS/BLUE SHIELD | Admitting: Urology

## 2017-03-20 ENCOUNTER — Other Ambulatory Visit: Payer: Self-pay | Admitting: Family Medicine

## 2017-03-20 DIAGNOSIS — B379 Candidiasis, unspecified: Secondary | ICD-10-CM

## 2017-03-20 DIAGNOSIS — T3695XA Adverse effect of unspecified systemic antibiotic, initial encounter: Principal | ICD-10-CM

## 2017-03-20 MED ORDER — FLUCONAZOLE 150 MG PO TABS: ORAL_TABLET | ORAL | 2 refills | Status: DC

## 2017-04-20 ENCOUNTER — Encounter: Payer: Self-pay | Admitting: Nurse Practitioner

## 2017-04-20 ENCOUNTER — Ambulatory Visit (INDEPENDENT_AMBULATORY_CARE_PROVIDER_SITE_OTHER): Payer: BLUE CROSS/BLUE SHIELD | Admitting: Nurse Practitioner

## 2017-04-20 VITALS — BP 117/61 | HR 93 | Temp 97.7°F | Ht 64.0 in | Wt 182.2 lb

## 2017-04-20 DIAGNOSIS — N1330 Unspecified hydronephrosis: Secondary | ICD-10-CM

## 2017-04-20 DIAGNOSIS — R509 Fever, unspecified: Secondary | ICD-10-CM

## 2017-04-20 DIAGNOSIS — J029 Acute pharyngitis, unspecified: Secondary | ICD-10-CM

## 2017-04-20 DIAGNOSIS — N3000 Acute cystitis without hematuria: Secondary | ICD-10-CM

## 2017-04-20 DIAGNOSIS — R197 Diarrhea, unspecified: Secondary | ICD-10-CM | POA: Diagnosis not present

## 2017-04-20 LAB — COMPREHENSIVE METABOLIC PANEL
AG Ratio: 1.7 (calc) (ref 1.0–2.5)
ALT: 9 U/L (ref 6–29)
AST: 12 U/L (ref 10–30)
Albumin: 4.3 g/dL (ref 3.6–5.1)
Alkaline phosphatase (APISO): 74 U/L (ref 33–115)
BUN: 10 mg/dL (ref 7–25)
CO2: 29 mmol/L (ref 20–32)
Calcium: 8.9 mg/dL (ref 8.6–10.2)
Chloride: 105 mmol/L (ref 98–110)
Creat: 0.62 mg/dL (ref 0.50–1.10)
Globulin: 2.5 g/dL (calc) (ref 1.9–3.7)
Glucose, Bld: 91 mg/dL (ref 65–139)
Potassium: 3.9 mmol/L (ref 3.5–5.3)
Sodium: 140 mmol/L (ref 135–146)
Total Bilirubin: 0.7 mg/dL (ref 0.2–1.2)
Total Protein: 6.8 g/dL (ref 6.1–8.1)

## 2017-04-20 LAB — CBC WITH DIFFERENTIAL/PLATELET
Basophils Absolute: 51 cells/uL (ref 0–200)
Basophils Relative: 0.8 %
Eosinophils Absolute: 70 cells/uL (ref 15–500)
Eosinophils Relative: 1.1 %
HCT: 38.8 % (ref 35.0–45.0)
Hemoglobin: 13.2 g/dL (ref 11.7–15.5)
Lymphs Abs: 1574 cells/uL (ref 850–3900)
MCH: 28.5 pg (ref 27.0–33.0)
MCHC: 34 g/dL (ref 32.0–36.0)
MCV: 83.8 fL (ref 80.0–100.0)
MPV: 10.6 fL (ref 7.5–12.5)
Monocytes Relative: 4.1 %
Neutro Abs: 4442 cells/uL (ref 1500–7800)
Neutrophils Relative %: 69.4 %
Platelets: 227 10*3/uL (ref 140–400)
RBC: 4.63 10*6/uL (ref 3.80–5.10)
RDW: 11.9 % (ref 11.0–15.0)
Total Lymphocyte: 24.6 %
WBC mixed population: 262 cells/uL (ref 200–950)
WBC: 6.4 10*3/uL (ref 3.8–10.8)

## 2017-04-20 LAB — POCT URINALYSIS DIPSTICK
Bilirubin, UA: NEGATIVE
Glucose, UA: NEGATIVE
Ketones, UA: NEGATIVE
Nitrite, UA: NEGATIVE
Spec Grav, UA: 1.015 (ref 1.010–1.025)
Urobilinogen, UA: 0.2 E.U./dL
pH, UA: 7.5 (ref 5.0–8.0)

## 2017-04-20 LAB — POCT RAPID STREP A (OFFICE): Rapid Strep A Screen: NEGATIVE

## 2017-04-20 MED ORDER — CEPHALEXIN 500 MG PO CAPS: 500.0000 mg | ORAL_CAPSULE | Freq: Three times a day (TID) | ORAL | 0 refills | Status: AC

## 2017-04-20 NOTE — Patient Instructions (Addendum)
Nyima, Thank you for coming in to clinic today.  1. For your symptoms: - This is likely a viral infection and could last up to 10 days.  But if your diarrhea does not improve in another 18-24 hours, you likely have bacterial gastroenteritis. - Start taking Tylenol extra strength 1 to 2 tablets every 6-8 hours for aches or fever/chills for next few days as needed.  Do not take more than 3,000 mg in 24 hours from all medicines.  May take Ibuprofen as well if tolerated 200-400mg  every 8 hours as needed. - also can use Flonase 2 sprays each nostril daily for up to 4-6 weeks - Start Mucinex-DM OTC up to 7-10 days then stop - START taking Immodium AD for your diarrhea up to 3 times today.  If symptoms get worse with difficulty breathing at night (working harder to breathe or faster breathing), fevers still >101 or still less than 97.5 by tomorrow morning, vomiting or not tolerating any food or liquids, or decreased urinating with no peeing in 12 hours. Then we recommend returning for re-evaluation or you may go to Urgent Care or Emergency Department.    Please schedule a follow-up appointment with Wilhelmina Mcardle, AGNP. Return 1-5 days if symptoms worsen or fail to improve.   If you have any other questions or concerns, please feel free to call the clinic or send a message through MyChart. You may also schedule an earlier appointment if necessary.  You will receive a survey after today's visit either digitally by e-mail or paper by [employer]. Your experiences and feedback matter to [place].  Please respond so we know how we are doing as we provide care for you.   Wilhelmina Mcardle, DNP, AGNP-BC Adult Gerontology Nurse Practitioner [facility], [facility]

## 2017-04-20 NOTE — Progress Notes (Signed)
Subjective:    Patient ID: Katelyn Holland, female    DOB: 09/13/1975, 41 y.o.   MRN: [id number]  Katelyn Holland is a 41 y.o. female presenting on 04/20/2017 for Sore Throat (right ear pain, nauseated, headaches  and diarrhea x 2 days )   HPI  URI Pt reports chills, sweats.  Worst at night.  Yesterday woke up w/ symtoms of sore throat, ear pain and muffled sounds, head throbbing/frontal headache, nausea, diarrhea started yesterday and has continued for more than 24 hours. Ibuprofen has not helped headache or significantly stopped chills/sweats. - Some dry cough.   Vomiting and Diarrhea Vomiting - none today.  Last night was last time. Ate small piece of pork tenderloin this morning.  3 bites then had diarrhea. Also had diarrhea 5-6x yesterday, 2x BM today. Diarrhea soft mushy, but not watery dark green.  BM normally soft w/ brown color.  Denies all abdominal pain. No known sick contacts.  Left Kidney removal planned for end of October.  Gas and bad infection of left kidney last year.  NO flank pain today, but states had abnormal presentation to initial infection.  Social History  Substance Use Topics  . Smoking status: Never Smoker  . Smokeless tobacco: Never Used  . Alcohol use 0.0 - 0.6 oz/week     Comment: occasional 1 every couple of months    Review of Systems Per HPI unless specifically indicated above     Objective:    BP 117/61 (BP Location: Right Arm, Patient Position: Sitting, Cuff Size: Normal)   Pulse 93   Temp 97.7 F (36.5 C) (Oral)   Ht  (1.626 m)   Wt 182 lb 3.2 oz (82.6 kg)   BMI 31.27 kg/m   Wt Readings from Last 3 Encounters:  04/20/17 182 lb 3.2 oz (82.6 kg)  01/09/17 181 lb 6.4 oz (82.3 kg)  10/16/16 160 lb (72.6 kg)    Physical Exam  Constitutional: She is oriented to person, place, and time. She appears well-developed and well-nourished. She has a sickly appearance.  HENT:  Head: Normocephalic and atraumatic.  Right Ear:  External ear normal.  Left Ear: External ear normal.  Nose: Mucosal edema present.  Mouth/Throat: Uvula is midline, oropharynx is clear and moist and mucous membranes are normal. No oropharyngeal exudate, posterior oropharyngeal edema, posterior oropharyngeal erythema or tonsillar abscesses.  Frontal headache reported, not worsened w/ palpation Cerumen impaction prevents assessment of TM bilaterally  Eyes: Pupils are equal, round, and reactive to light. Conjunctivae are normal.  Neck: Normal range of motion. Neck supple. No JVD present. No tracheal deviation present. No thyromegaly present.  Cardiovascular: Normal rate, regular rhythm and normal heart sounds.   Pulmonary/Chest: Effort normal and breath sounds normal. No respiratory distress. She has no wheezes.  Abdominal: She exhibits no shifting dullness, no distension, no pulsatile liver, no fluid wave, no abdominal bruit, no ascites, no pulsatile midline mass and no mass. Bowel sounds are increased. There is no hepatosplenomegaly. There is no tenderness. There is no rigidity, no guarding, no CVA tenderness, no tenderness at McBurney's point and negative Murphy's sign.  Lymphadenopathy:    She has no cervical adenopathy.  Neurological: She is alert and oriented to person, place, and time.  Skin: Skin is warm and dry. She is not diaphoretic.  Psychiatric: She has a normal mood and affect. Her speech is normal and behavior is normal.  Vitals reviewed.    Results for orders placed or performed in visit  on 04/20/17  Urine Culture  Result Value Ref Range   MICRO NUMBER: [id number]    SPECIMEN QUALITY: ADEQUATE    Sample Source NOT GIVEN    STATUS: FINAL    Result: No Growth   CBC with Differential/Platelet  Result Value Ref Range   WBC 6.4 3.8 - 10.8 Thousand/uL   RBC 4.63 3.80 - 5.10 Million/uL   Hemoglobin 13.2 11.7 - 15.5 g/dL   HCT 91.4 78.2 - 95.6 %   MCV 83.8 80.0 - 100.0 fL   MCH 28.5 27.0 - 33.0 pg   MCHC 34.0 32.0 - 36.0 g/dL    RDW 21.3 08.6 - 57.8 %   Platelets 227 140 - 400 Thousand/uL   MPV 10.6 7.5 - 12.5 fL   Neutro Abs 4,442 1,500 - 7,800 cells/uL   Lymphs Abs 1,574 850 - 3,900 cells/uL   WBC mixed population 262 200 - 950 cells/uL   Eosinophils Absolute 70 15 - 500 cells/uL   Basophils Absolute 51 0 - 200 cells/uL   Neutrophils Relative % 69.4 %   Total Lymphocyte 24.6 %   Monocytes Relative 4.1 %   Eosinophils Relative 1.1 %   Basophils Relative 0.8 %  Comprehensive metabolic panel  Result Value Ref Range   Glucose, Bld 91 65 - 139 mg/dL   BUN 10 7 - 25 mg/dL   Creat 4.69 6.29 - 5.28 mg/dL   BUN/Creatinine Ratio NOT APPLICABLE 6 - 22 (calc)   Sodium 140 135 - 146 mmol/L   Potassium 3.9 3.5 - 5.3 mmol/L   Chloride 105 98 - 110 mmol/L   CO2 29 20 - 32 mmol/L   Calcium 8.9 8.6 - 10.2 mg/dL   Total Protein 6.8 6.1 - 8.1 g/dL   Albumin 4.3 3.6 - 5.1 g/dL   Globulin 2.5 1.9 - 3.7 g/dL (calc)   AG Ratio 1.7 1.0 - 2.5 (calc)   Total Bilirubin 0.7 0.2 - 1.2 mg/dL   Alkaline phosphatase (APISO) 74 33 - 115 U/L   AST 12 10 - 30 U/L   ALT 9 6 - 29 U/L  POCT rapid strep A  Result Value Ref Range   Rapid Strep A Screen Negative Negative  POCT urinalysis dipstick  Result Value Ref Range   Color, UA dark    Clarity, UA cloudy    Glucose, UA neg    Bilirubin, UA neg    Ketones, UA neg    Spec Grav, UA 1.015 1.010 - 1.025   Blood, UA trace    pH, UA 7.5 5.0 - 8.0   Protein, UA trace    Urobilinogen, UA 0.2 0.2 or 1.0 E.U./dL   Nitrite, UA neg    Leukocytes, UA Moderate (2+) (A) Negative      Assessment & Plan:   Problem List Items Addressed This Visit      Genitourinary   Chronic Hydronephrosis of kidney (Left) (Chronic) Complication possible w/ infection.   UA positive today.  Plan: 1. Send urine culture to confirm.   Relevant Orders   Urine Culture (Completed)    Other Visit Diagnoses    Sore throat    -  Primary Acute illness. Fever responsive to NSAIDs and tylenol.  Symptoms  not worsening. Consistent with viral illness x 1-2 days with no known sick contacts and no identifiable focal infections of ears, nose, throat.  Plan: 1. Reassurance, likely self-limited with cough lasting up to few weeks - Start Flonase 2 sprays each nostril daily for up  to 4-6 weeks - Start Mucinex-DM OTC up to 7-10 days then stop 2. Supportive care with nasal saline, warm herbal tea with honey, 3. Improve hydration 4. Tylenol / Motrin PRN fevers 5. Return criteria given    Relevant Orders   POCT rapid strep A (Completed)   Fever and chills     Systemic symptoms of infection w/ teeth chattering chills.  Unknown origin, but may be from URI or UTI presentation.  Plan: 1. CBC and CMP.  UA today.   Relevant Orders   POCT urinalysis dipstick (Completed)   CBC with Differential/Platelet (Completed)   Comprehensive metabolic panel (Completed)   Diarrhea of presumed infectious origin     See A/P fever and chills.  If enterocolitis, will likely resolve within 24-48 hours and duration of symptoms only approximately 30 hours.  Plan: 1. Reviewed emergency symptoms.  Wait for full 48 hours prior to antibiotic therapy as long as WBC in normal limits.   Relevant Orders   POCT urinalysis dipstick (Completed)   CBC with Differential/Platelet (Completed)   Comprehensive metabolic panel (Completed)   Acute cystitis without hematuria     Acute UTI is likely w/ results of UA.    Plan: 1. Treat w/ Keflex 500 mg bid x 7 days.  2. Send urine culture to confirm.   Relevant Medications   cephALEXin (KEFLEX) 500 MG capsule      Meds ordered this encounter  Medications  . cephALEXin (KEFLEX) 500 MG capsule    Sig: Take 1 capsule (500 mg total) by mouth 3 (three) times daily.    Dispense:  21 capsule    Refill:  0    Order Specific Question:   Supervising Provider    Answer:   Smitty Cords [[id number]]   Follow up plan: Return 1-5 days if symptoms worsen or fail to improve.  Wilhelmina Mcardle, DNP, AGPCNP-BC Adult Gerontology Primary Care Nurse Practitioner [facility] 04/24/2017, 5:50 PM

## 2017-04-21 LAB — URINE CULTURE
MICRO NUMBER:: 81135019
Result:: NO GROWTH
SPECIMEN QUALITY:: ADEQUATE

## 2017-05-17 ENCOUNTER — Ambulatory Visit (INDEPENDENT_AMBULATORY_CARE_PROVIDER_SITE_OTHER): Payer: BLUE CROSS/BLUE SHIELD | Admitting: Nurse Practitioner

## 2017-05-17 ENCOUNTER — Encounter: Payer: Self-pay | Admitting: Nurse Practitioner

## 2017-05-17 VITALS — BP 112/76 | HR 98 | Temp 98.0°F | Ht 64.0 in | Wt 178.8 lb

## 2017-05-17 DIAGNOSIS — L249 Irritant contact dermatitis, unspecified cause: Secondary | ICD-10-CM | POA: Diagnosis not present

## 2017-05-17 MED ORDER — TRIAMCINOLONE ACETONIDE 0.1 % EX CREA: 1.0000 "application " | TOPICAL_CREAM | Freq: Two times a day (BID) | CUTANEOUS | 0 refills | Status: DC

## 2017-05-17 NOTE — Progress Notes (Signed)
Subjective:    Patient ID: Katelyn Holland, female    DOB: 1976/02/27, [age] y.o.   MRN: [id number]  Katelyn Holland is a 41 y.o. female presenting on 05/17/2017 for Rash   HPI Rash First noted yesterday over incision sites and spreading away onto abdomen.  Today has spread further down groin to leg.  Abdominal and leg rash are not pruritic.  Pt has not taken any medications for this rash.   - As of today also has new rash on arm/hand.  Arm rash is itchy. - Since surgery has taken 3 short showers.  Regular soap/water.  Uses dove soap as she had done prior to surgery.   - Pt has long history of sensitive skin and only uses specific products that do not cause rash.  No new products - In hospital: ampicillin, gentamicin, oral keflex w/o any outpatient antibiotics that could be cause of rash.   Social History   Tobacco Use  . Smoking status: Never Smoker  . Smokeless tobacco: Never Used  Substance Use Topics  . Alcohol use: Yes    Alcohol/week: 0.0 - 0.6 oz    Comment: occasional 1 every couple of months  . Drug use: No    Review of Systems Per HPI unless specifically indicated above     Objective:    BP 112/76 (BP Location: Right Arm, Patient Position: Sitting, Cuff Size: Normal)   Pulse 98   Temp 98 F (36.7 C) (Oral)   Ht 5\' 4"  (1.626 m)   Wt 178 lb 12.8 oz (81.1 kg)   BMI 30.69 kg/m   Wt Readings from Last 3 Encounters:  05/17/17 178 lb 12.8 oz (81.1 kg)  04/20/17 182 lb 3.2 oz (82.6 kg)  01/09/17 181 lb 6.4 oz (82.3 kg)    Physical Exam  Constitutional: She is oriented to person, place, and time. Vital signs are normal. She appears well-developed and well-nourished. She appears distressed (mildly r/t pain).  Neurological: She is alert and oriented to person, place, and time.  Skin: Skin is warm and dry. Lesion (Multiple healing surgical lesions across left half of abdomen covered w/ skin glue) and rash noted.  Rash on abdomen spreading diffusely away from incision  sites.  Erythematous, edematous maculopapular rash.  Also noted on left leg.  Left lower abdomen insicion on pannus makes contact with leg at location of rash when pt is seated.  3 small erythematous patches on arm, 1 small erythematous patch on thumb.  Differing in appearance from abdominal rash w/o associated edema, papules.  Psychiatric: She has a normal mood and affect. Her speech is normal.             Results for orders placed or performed in visit on 04/20/17  Urine Culture  Result Value Ref Range   MICRO NUMBER: [id number]    SPECIMEN QUALITY: ADEQUATE    Sample Source NOT GIVEN    STATUS: FINAL    Result: No Growth   CBC with Differential/Platelet  Result Value Ref Range   WBC 6.4 3.8 - 10.8 Thousand/uL   RBC 4.63 3.80 - 5.10 Million/uL   Hemoglobin 13.2 11.7 - 15.5 g/dL   HCT 96.238.8 95.235.0 - 84.145.0 %   MCV 83.8 80.0 - 100.0 fL   MCH 28.5 27.0 - 33.0 pg   MCHC 34.0 32.0 - 36.0 g/dL   RDW 32.411.9 40.111.0 - 02.715.0 %   Platelets 227 140 - 400 Thousand/uL   MPV 10.6 7.5 - 12.5 fL  Neutro Abs 4,442 1,500 - 7,800 cells/uL   Lymphs Abs 1,574 850 - 3,900 cells/uL   WBC mixed population 262 200 - 950 cells/uL   Eosinophils Absolute 70 15 - 500 cells/uL   Basophils Absolute 51 0 - 200 cells/uL   Neutrophils Relative % 69.4 %   Total Lymphocyte 24.6 %   Monocytes Relative 4.1 %   Eosinophils Relative 1.1 %   Basophils Relative 0.8 %  Comprehensive metabolic panel  Result Value Ref Range   Glucose, Bld 91 65 - 139 mg/dL   BUN 10 7 - 25 mg/dL   Creat 2.130.62 0.860.50 - 5.781.10 mg/dL   BUN/Creatinine Ratio NOT APPLICABLE 6 - 22 (calc)   Sodium 140 135 - 146 mmol/L   Potassium 3.9 3.5 - 5.3 mmol/L   Chloride 105 98 - 110 mmol/L   CO2 29 20 - 32 mmol/L   Calcium 8.9 8.6 - 10.2 mg/dL   Total Protein 6.8 6.1 - 8.1 g/dL   Albumin 4.3 3.6 - 5.1 g/dL   Globulin 2.5 1.9 - 3.7 g/dL (calc)   AG Ratio 1.7 1.0 - 2.5 (calc)   Total Bilirubin 0.7 0.2 - 1.2 mg/dL   Alkaline phosphatase (APISO) 74 33  - 115 U/L   AST 12 10 - 30 U/L   ALT 9 6 - 29 U/L  POCT rapid strep A  Result Value Ref Range   Rapid Strep A Screen Negative Negative  POCT urinalysis dipstick  Result Value Ref Range   Color, UA dark    Clarity, UA cloudy    Glucose, UA neg    Bilirubin, UA neg    Ketones, UA neg    Spec Grav, UA 1.015 1.010 - 1.025   Blood, UA trace    pH, UA 7.5 5.0 - 8.0   Protein, UA trace    Urobilinogen, UA 0.2 0.2 or 1.0 E.U./dL   Nitrite, UA neg    Leukocytes, UA Moderate (2+) (A) Negative      Assessment & Plan:   Problem List Items Addressed This Visit    None    Visit Diagnoses    Irritant contact dermatitis, unspecified trigger    -  Primary Contact dermatitis likely caused by skin glue used for surgical wound closure.  Pt told by surgeon to contact PCP.  Pt ideally would have care for this issue by Urology since is related to surgery.  If additional issues, requested pt to contact urology.  Plan: 1. Apply triamcinolone cream twice daily to area around incisions. 2. If further worsening, contact urology. 3. Followup as needed.   Relevant Medications   triamcinolone cream (KENALOG) 0.1 %      Meds ordered this encounter  Medications  . acetaminophen (TYLENOL) 325 MG tablet    Sig: Take 650 mg by mouth.  . docusate sodium (COLACE) 100 MG capsule    Sig: Take 100 mg by mouth.  . senna (SENOKOT) 8.6 MG tablet    Sig: Take by mouth.    Follow up plan: Return 3-5 days if symptoms worsen or fail to improve.   Wilhelmina McardleLauren Tymber Stallings, DNP, AGPCNP-BC Adult Gerontology Primary Care Nurse Practitioner [facility] 05/17/2017, 10:57 AM

## 2017-05-17 NOTE — Patient Instructions (Signed)
Novella, Thank you for coming in to clinic today.  1. Your rash appears to be a reaction to the dermabond skin glue. -  Don't try to remove the glue. - APPLY triamcinolone ointment around your incisions, but not directly over the incision.  - Call your surgeon for any additional concerns with your rash.  Please schedule a follow-up appointment with Wilhelmina McardleLauren Tyhir Schwan, AGNP. No Follow-up on file.  If you have any other questions or concerns, please feel free to call the clinic or send a message through MyChart. You may also schedule an earlier appointment if necessary.  You will receive a survey after today's visit either digitally by e-mail or paper by [employer] mail. Your experiences and feedback matter to us.  Please respond so we know how we are doing as we provide care for you.   Wilhelmina McardleLauren Luana Tatro, DNP, AGNP-BC Adult Gerontology Nurse Practitioner [facility], [facility]

## 2017-05-25 ENCOUNTER — Encounter: Payer: Self-pay | Admitting: Nurse Practitioner

## 2017-07-07 ENCOUNTER — Other Ambulatory Visit: Payer: Self-pay | Admitting: Family Medicine

## 2017-07-07 DIAGNOSIS — G4762 Sleep related leg cramps: Secondary | ICD-10-CM

## 2017-07-22 ENCOUNTER — Emergency Department
Admission: EM | Admit: 2017-07-22 | Discharge: 2017-07-22 | Disposition: A | Discharge status: Home / Self Care | Payer: BLUE CROSS/BLUE SHIELD | Attending: Emergency Medicine | Admitting: Emergency Medicine

## 2017-07-22 ENCOUNTER — Other Ambulatory Visit: Payer: Self-pay

## 2017-07-22 ENCOUNTER — Encounter: Payer: Self-pay | Admitting: Emergency Medicine

## 2017-07-22 DIAGNOSIS — J069 Acute upper respiratory infection, unspecified: Secondary | ICD-10-CM

## 2017-07-22 DIAGNOSIS — J029 Acute pharyngitis, unspecified: Secondary | ICD-10-CM

## 2017-07-22 DIAGNOSIS — Z79899 Other long term (current) drug therapy: Secondary | ICD-10-CM | POA: Diagnosis not present

## 2017-07-22 DIAGNOSIS — R05 Cough: Secondary | ICD-10-CM | POA: Insufficient documentation

## 2017-07-22 DIAGNOSIS — B9789 Other viral agents as the cause of diseases classified elsewhere: Secondary | ICD-10-CM | POA: Insufficient documentation

## 2017-07-22 LAB — INFLUENZA PANEL BY PCR (TYPE A & B)
Influenza A By PCR: NEGATIVE
Influenza B By PCR: NEGATIVE

## 2017-07-22 LAB — GROUP A STREP BY PCR: GROUP A STREP BY PCR: NOT DETECTED

## 2017-07-22 MED ORDER — HYDROCOD POLST-CPM POLST ER 10-8 MG/5ML PO SUER
5.0000 mL | Freq: Once | ORAL | Status: AC
Start: 2017-07-22 — End: 2017-07-22
  Administered 2017-07-22: 5 mL via ORAL
  Filled 2017-07-22: qty 5

## 2017-07-22 MED ORDER — HYDROCOD POLST-CPM POLST ER 10-8 MG/5ML PO SUER: 5.0000 mL | Freq: Two times a day (BID) | ORAL | 0 refills | Status: DC

## 2017-07-22 NOTE — ED Provider Notes (Signed)
[facility] Emergency Department Provider Note   ____________________________________________   First MD Initiated Contact with Patient 07/22/17 0719     (approximate)  I have reviewed the triage vital signs and the nursing notes.   HISTORY  Chief Complaint Sore Throat    HPI Katelyn Holland is a 42 y.o. female patient complained of sore throat and cough for 2 days.  Patient denies fever or chills associated with this complaint.  Patient denies nausea, vomiting, or diarrhea.  Patient rates the pain as a 3/10.  Patient describes pain as "sore".  About this measure for complaint.  Past Medical History:  Diagnosis Date  . Arthritis   . Chronic constipation   . Gravida 1 para 1   . Kidney stone   . Kidney stones     Patient Active Problem List   Diagnosis Date Noted  . S/P total abdominal hysterectomy 01/09/2017  . Nocturnal leg cramps 02/12/2016  . Current low back pain (Location of Primary Source of Pain) (Left) 02/11/2016  . Chronic flank pain (Left) 02/11/2016  . Encounter for pain management planning 02/11/2016  . Chronic pain 02/10/2016  . Long term current use of opiate analgesic 02/10/2016  . Long term prescription opiate use 02/10/2016  . Opiate use 02/10/2016  . Chronic Hydronephrosis of kidney (Left) 07/09/2015  . 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 Hypokalemia 07/09/2015  . Anemia 07/09/2015  . 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 Chronic nephrolithiasis (Left) 07/07/2015  . Chronic constipation 07/07/2015  . Family history of heart disease in female family member before age 68 12/24/2014  . Low libido 12/24/2014  . Obesity (BMI 30.0-34.9) 04/10/2014    Past Surgical History:  Procedure Laterality Date  . ABDOMINAL HYSTERECTOMY    . APPENDECTOMY    . BACK SURGERY     x 2  . KIDNEY STONE SURGERY Left 2000   surgery through back  . LAPAROSCOPIC GASTRIC SLEEVE RESECTION  2016  . LITHOTRIPSY  2007    . NEPHRECTOMY Left   . NEPHROLITHOTOMY Left 07/23/2015   Procedure: NEPHROLITHOTOMY PERCUTANEOUS;  Surgeon: Vanna Scotland, MD;  Location: [facility] ORS;  Service: Urology;  Laterality: Left;    Prior to Admission medications   Medication Sig Start Date End Date Taking? Authorizing Provider  acyclovir (ZOVIRAX) 400 MG tablet Take 1 tablet (400 mg total) by mouth 2 (two) times daily. Patient not taking: Reported on 05/17/2017 08/01/16   Smitty Cords, DO  chlorpheniramine-HYDROcodone [facility] PENNKINETIC ER) 10-8 MG/5ML SUER Take 5 mLs by mouth 2 (two) times daily. 07/22/17   Joni Reining, PA-C  cyclobenzaprine (FLEXERIL) 10 MG tablet TAKE 1 TO 2 TABLETS(10 TO 20 MG) BY MOUTH AT BEDTIME AS NEEDED FOR MUSCLE SPASMS 07/07/17   Galen Manila, NP  fluconazole (DIFLUCAN) 150 MG tablet Take one tablet by mouth on Day 1. Repeat dose 2nd tablet on Day 3. Patient not taking: Reported on 05/17/2017 03/20/17   Smitty Cords, DO  oxyCODONE (OXY IR/ROXICODONE) 5 MG immediate release tablet  05/17/17   [provider]  Probiotic Product (PROBIOTIC-10 PO) Take by mouth.    [provider]  triamcinolone cream (KENALOG) 0.1 % Apply 1 application 2 (two) times daily topically. 05/17/17   Galen Manila, NP  trimethoprim (TRIMPEX) 100 MG tablet Take 1 tablet (100 mg total) by mouth daily. Patient not taking: Reported on 05/17/2017 09/16/16   Vanna Scotland, MD    Allergies Dilaudid  [hydromorphone hcl] and Iodinated diagnostic  agents  Family History  Problem Relation Age of Onset  . Heart disease Mother 57  . Cancer Maternal Grandmother   . Heart disease Father   . Kidney cancer Neg Hx   . Bladder Cancer Neg Hx   . Prostate cancer Neg Hx     Social History Social History   Tobacco Use  . Smoking status: Never Smoker  . Smokeless tobacco: Never Used  Substance Use Topics  . Alcohol use: Yes    Alcohol/week: 0.0 - 0.6 oz    Comment: occasional 1  every couple of months  . Drug use: No    Review of Systems Constitutional: No fever/chills Eyes: No visual changes. ENT: Sore throat . cardiovascular: Denies chest pain. Respiratory: Denies shortness of breath.  Nonproductive cough Gastrointestinal: No abdominal pain.  No nausea, no vomiting.  No diarrhea.  No constipation. Genitourinary: Negative for dysuria. Musculoskeletal: Negative for back pain. Skin: Negative for rash. Neurological: Negative for headaches, focal weakness or numbness. Psychiatric:Opiate dependency Allergic/Immunilogical: Dilaudid and iodine ____________________________________________   PHYSICAL EXAM:  VITAL SIGNS: ED Triage Vitals  Enc Vitals Group     BP 07/22/17 0711 124/63     Pulse Rate 07/22/17 0711 100     Resp 07/22/17 0711 18     Temp 07/22/17 0711 98.2 F (36.8 C)     Temp Source 07/22/17 0711 Oral     SpO2 07/22/17 0711 99 %     Weight 07/22/17 0712 175 lb (79.4 kg)     Height 07/22/17 0712 5\' 4"  (1.626 m)     Head Circumference --      Peak Flow --      Pain Score 07/22/17 0711 3     Pain Loc --      Pain Edu? --      Excl. in GC? --    Constitutional: Alert and oriented. Well appearing and in no acute distress. Nose: No congestion/rhinnorhea. Mouth/Throat: Mucous membranes are moist.  Oropharynx erythematous. Neck: No stridor.  No cervical spine tenderness to palpation. Hematological/Lymphatic/Immunilogical: No cervical lymphadenopathy. Cardiovascular: Normal rate, regular rhythm. Grossly normal heart sounds.  Good peripheral circulation. Respiratory: Normal respiratory effort.  No retractions. Lungs CTAB.  Nonproductive cough Neurologic:  Normal speech and language. No gross focal neurologic deficits are appreciated. No gait instability. Skin:  Skin is warm, dry and intact. No rash noted. Psychiatric: Mood and affect are normal. Speech and behavior are normal.  ____________________________________________   LABS (all labs  ordered are listed, but only abnormal results are displayed)  Labs Reviewed  GROUP A STREP BY PCR  INFLUENZA PANEL BY PCR (TYPE A & B)   ____________________________________________  EKG   ____________________________________________  RADIOLOGY  No results found.  ____________________________________________   PROCEDURES  Procedure(s) performed: None  Procedures  Critical Care performed: No  ____________________________________________   INITIAL IMPRESSION / ASSESSMENT AND PLAN / ED COURSE  As part of my medical decision making, I reviewed the following data within the electronic MEDICAL RECORD NUMBER Evaluated by EM attending , Notes from prior ED visits and [place] Controlled Substance Database   Viral upper respiratory infection and pharyngitis.  Discussed negative strep and flu results with patient.  Patient given discharge care instruction advised take medication as directed.  Patient advised to follow-up PCP if condition persists.      ____________________________________________   FINAL CLINICAL IMPRESSION(S) / ED DIAGNOSES  Final diagnoses:  Viral pharyngitis  Viral URI with cough     ED Discharge Orders  Ordered    chlorpheniramine-HYDROcodone (TUSSIONEX PENNKINETIC ER) 10-8 MG/5ML SUER  2 times daily     07/22/17 0843       Note:  This document was prepared using Dragon voice recognition software and may include unintentional dictation errors.     Joni ReiningSmith, Tyler Cubit K, PA-C 07/22/17 [id number]    Arnaldo NatalMalinda, Paul F, MD 07/23/17 1316

## 2017-07-22 NOTE — ED Triage Notes (Signed)
Patient to ER for c/o sore throat x2 days. Patient denies any known fevers.

## 2017-07-22 NOTE — ED Notes (Addendum)
Pt reports cough that started last night and sore throat since Tuesday, pt states she feels drainage going down her throat and is frequently clearing her throat.  Pt states the pain is minimal at this time. Pt denies any fevers.

## 2017-07-25 ENCOUNTER — Ambulatory Visit (INDEPENDENT_AMBULATORY_CARE_PROVIDER_SITE_OTHER): Payer: BLUE CROSS/BLUE SHIELD | Admitting: Family Medicine

## 2017-07-25 ENCOUNTER — Ambulatory Visit
Admission: RE | Admit: 2017-07-25 | Discharge: 2017-07-25 | Disposition: A | Discharge status: Home / Self Care | Payer: BLUE CROSS/BLUE SHIELD | Source: Ambulatory Visit | Attending: Family Medicine | Admitting: Family Medicine

## 2017-07-25 ENCOUNTER — Encounter: Payer: Self-pay | Admitting: Family Medicine

## 2017-07-25 VITALS — BP 124/62 | HR 98 | Temp 98.7°F | Resp 16 | Ht 64.0 in | Wt 188.0 lb

## 2017-07-25 DIAGNOSIS — B379 Candidiasis, unspecified: Secondary | ICD-10-CM

## 2017-07-25 DIAGNOSIS — J209 Acute bronchitis, unspecified: Secondary | ICD-10-CM | POA: Diagnosis not present

## 2017-07-25 DIAGNOSIS — G9331 Postviral fatigue syndrome: Secondary | ICD-10-CM

## 2017-07-25 DIAGNOSIS — T3695XA Adverse effect of unspecified systemic antibiotic, initial encounter: Secondary | ICD-10-CM

## 2017-07-25 DIAGNOSIS — G933 Postviral fatigue syndrome: Secondary | ICD-10-CM

## 2017-07-25 MED ORDER — BENZONATATE 100 MG PO CAPS: 100.0000 mg | ORAL_CAPSULE | Freq: Three times a day (TID) | ORAL | 0 refills | Status: DC | PRN

## 2017-07-25 MED ORDER — ONDANSETRON 4 MG PO TBDP: 4.0000 mg | ORAL_TABLET | Freq: Three times a day (TID) | ORAL | 0 refills | Status: DC | PRN

## 2017-07-25 MED ORDER — FLUCONAZOLE 150 MG PO TABS: ORAL_TABLET | ORAL | 2 refills | Status: DC

## 2017-07-25 MED ORDER — AZITHROMYCIN 250 MG PO TABS: ORAL_TABLET | ORAL | 0 refills | Status: DC

## 2017-07-25 MED ORDER — IPRATROPIUM BROMIDE 0.06 % NA SOLN: 2.0000 | Freq: Four times a day (QID) | NASAL | 0 refills | Status: DC

## 2017-07-25 NOTE — Progress Notes (Signed)
Subjective:    Patient ID: Katelyn Holland, female    DOB: [birth year], 42 y.o.   MRN: [id number]  Katelyn Holland is a 42 y.o. female presenting on 07/25/2017 for Cough (onset 2 weeks, sore throat, HA went to ER strep test negative throwing up started today, chills mild fever)  Patient presents for a same day appointment.  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 - [facility] ED [facility], diagnosed with URI viral, tested with rapid strep and flu, both were negative, she was given rx Tussionex cough syrup, some improvement - Today new change with some nausea and vomiting, with few episodes, non bloody, not related to cough or other cause. Still having cough and not feeling well - No known flu exposure - Admits chills, diarrhea episodes, body aches and muscle aches - Denies fevers, sweats, abdominal pain, diarrhea, headache, dyspnea, chest pain  Health Maintenance: Due for Flu Shot, ill today - will return in 2 weeks after current illness   Depression screen [facility] 2/9 04/20/2016 02/11/2016 12/24/2014  Decreased Interest 0 0 0  Down, Depressed, Hopeless 0 0 0  PHQ - 2 Score 0 0 0    Social History   Tobacco Use  . Smoking status: Never Smoker  . Smokeless tobacco: Never Used  Substance Use Topics  . Alcohol use: Yes    Alcohol/week: 0.0 - 0.6 oz    Comment: occasional 1 every couple of months  . Drug use: No    Review of Systems Per HPI unless specifically indicated above     Objective:    BP 124/62   Pulse 98   Temp 98.7 F (37.1 C) (Oral)   Resp 16   Ht 5\' 4"  (1.626 m)   Wt 188 lb (85.3 kg)   SpO2 100%   BMI 32.27 kg/m   Wt Readings from Last 3 Encounters:  07/25/17 188 lb (85.3 kg)  07/22/17 175 lb (79.4 kg)  05/17/17 178 lb 12.8 oz (81.1 kg)      Physical Exam  Constitutional: She is oriented to person, place, and time. She appears well-developed and well-nourished. No distress.  Ill appearing, uncomfortable with cough congestion and nausea, cooperative  HENT:  Head: Normocephalic and atraumatic.  Mouth/Throat: Oropharynx is clear and moist.  Frontal sinuses mild tender. Nares with turbinate edema and congestion without purulence. Bilateral TMs obscured by cerumen. Oropharynx mild posterior general erythema and drainage without exudates, edema or asymmetry.  Eyes: Conjunctivae are normal. Right eye exhibits no discharge. Left eye exhibits no discharge.  Neck: Normal range of motion. Neck supple.  Cardiovascular: Normal rate, regular rhythm, normal heart sounds and intact distal pulses.  No murmur heard. Pulmonary/Chest: Effort normal. No respiratory distress. She has no wheezes.  Reduced air movement at bilateral bases and coarse breath sounds, without wheezing  Musculoskeletal: Normal range of motion. She exhibits no edema.  Lymphadenopathy:    She has no cervical adenopathy.  Neurological: She is alert and oriented to person, place, and time.  Skin: Skin is warm and dry. No rash noted. She is not diaphoretic. No erythema.  Psychiatric: Her behavior is normal.  Nursing note and vitals reviewed.  I have personally reviewed the radiology report from 07/25/17 Chest X-ray.  CLINICAL  DATA:  Two weeks of productive cough, chills, and flu like symptoms but negative flu test.  EXAM: CHEST  2 VIEW  COMPARISON:  Chest x-ray of December 25, 2013  FINDINGS: The lungs are well-expanded and clear. The heart and pulmonary vascularity are normal. The mediastinum is normal in width. There is no pleural effusion. The bony thorax exhibits no acute abnormality.  IMPRESSION: There is no pneumonia nor other acute cardiopulmonary abnormality.   Electronically Signed   By: David  [place] M.D.   On: 07/25/2017 10:11  Results for orders  placed or performed during the hospital encounter of 07/22/17  Group A Strep by PCR  Result Value Ref Range   Group A Strep by PCR NOT DETECTED NOT DETECTED  Influenza panel by PCR (type A & B)  Result Value Ref Range   Influenza A By PCR NEGATIVE NEGATIVE   Influenza B By PCR NEGATIVE NEGATIVE      Assessment & Plan:   Problem List Items Addressed This Visit    None    Visit Diagnoses    Acute bronchitis, unspecified organism    -  Primary   Relevant Medications   ipratropium (ATROVENT) 0.06 % nasal spray   benzonatate (TESSALON) 100 MG capsule   ondansetron (ZOFRAN ODT) 4 MG disintegrating tablet   azithromycin (ZITHROMAX Z-PAK) 250 MG tablet   Other Relevant Orders   DG Chest 2 View (Completed)   Post viral syndrome       Relevant Medications   ipratropium (ATROVENT) 0.06 % nasal spray   benzonatate (TESSALON) 100 MG capsule   ondansetron (ZOFRAN ODT) 4 MG disintegrating tablet   Other Relevant Orders   DG Chest 2 View (Completed)   Antibiotic-induced yeast infection       Relevant Medications   azithromycin (ZITHROMAX Z-PAK) 250 MG tablet   fluconazole (DIFLUCAN) 150 MG tablet     Consistent with worsening bronchitis in setting of likely viral URI (+sick contacts), duration >1 week now with worsening, and some flu-like symptoms, despite negative rapid flu/strep on 1/12 in ED - Afebrile, some coarse breath sounds on exam concern for some bronchospasm  Plan: 1. Check STAT CXR today - reviewed result, negative for focal pneumonia - patient notified - Will hold empiric CAP therapy, no ceftriaxone - but will provide empiric coverage for bronchitis - Start Azithromycin Z-pak dosing 500mg  then 250mg  daily x 4 days (diflucan sent as well for possible antibiotic yeast infxn) - Start Atrovent nasal spray decongestant 2 sprays in each nostril up to 4 times daily for 7 days - Start Tessalon Perls take 1 capsule up to 3 times a day as needed for cough - Start Zofran ODT PRN  nausea 3. Recommend trial OTC - Mucinex, Tylenol/Ibuprofen PRN, Nasal saline, lozenges, tea with honey/lemon 4. Return criteria reviewed, follow-up within 1 week if not improved    Meds ordered this encounter  Medications  . ipratropium (ATROVENT) 0.06 % nasal spray    Sig: Place 2 sprays into both nostrils 4 (four) times daily. For up to 5-7 days then stop.    Dispense:  15 mL    Refill:  0  . benzonatate (TESSALON) 100 MG capsule    Sig: Take 1 capsule (100 mg total) by mouth 3 (three) times daily as needed for cough.    Dispense:  30 capsule    Refill:  0  . ondansetron (ZOFRAN ODT) 4 MG disintegrating tablet    Sig: Take 1 tablet (4 mg total) by mouth  every 8 (eight) hours as needed for nausea or vomiting.    Dispense:  30 tablet    Refill:  0  . azithromycin (ZITHROMAX Z-PAK) 250 MG tablet    Sig: Take 2 tabs (500mg  total) on Day 1. Take 1 tab (250mg ) daily for next 4 days.    Dispense:  6 tablet    Refill:  0  . fluconazole (DIFLUCAN) 150 MG tablet    Sig: Take one tablet by mouth on Day 1. Repeat dose 2nd tablet on Day 3.    Dispense:  2 tablet    Refill:  2   Follow up plan: Return in about 1 week (around 08/01/2017), or if symptoms worsen or fail to improve, for bronchitis / post viral syndrome.  Saralyn Pilar, DO [facility] 07/25/2017, 12:55 PM

## 2017-07-25 NOTE — Patient Instructions (Addendum)
Thank you for coming to the office today.  1.  It sounds like you had an Upper Respiratory Virus that has settled into a Bronchitis, lower respiratory tract infection. I don't have concerns for pneumonia today, and think that this should gradually improve. Once you are feeling better, the cough may take a few weeks to fully resolve. I do hear some coarse breath sounds concern for possible pneumonia or bronchitis after virus or flu  Chest X-ray today, will notify you with results - most likely will add antibiotic based on results  Start Atrovent nasal spray decongestant 2 sprays in each nostril up to 4 times daily for 7 days  Start Tessalon Perls take 1 capsule up to 3 times a day as needed for cough  Improve hydration to clear secretions  Take Zofran every 8 hours as needed for nausea  Take cough syrup at home  May start OTC Mucinex for 1 week or less, to help clear the mucus - Use nasal saline (Simply Saline or Ocean Spray) to flush nasal congestion multiple times a day, may help cough - Drink plenty of fluids to improve congestion  If your symptoms seem to worsen instead of improve over next several days, including significant fever / chills, worsening shortness of breath, worsening wheezing, or nausea / vomiting and can't take medicines - return sooner or go to hospital Emergency Department for more immediate treatment.  Please schedule a Follow-up Appointment to: Return in about 1 week (around 08/01/2017), or if symptoms worsen or fail to improve, for bronchitis / post viral syndrome.  If you have any other questions or concerns, please feel free to call the office or send a message through MyChart. You may also schedule an earlier appointment if necessary.  Additionally, you may be receiving a survey about your experience at our office within a few days to 1 week by e-mail or mail. We value your feedback.  Saralyn PilarAlexander Karamalegos, DO [facility]

## 2017-08-14 ENCOUNTER — Other Ambulatory Visit: Payer: Self-pay | Admitting: Nurse Practitioner

## 2017-08-14 DIAGNOSIS — G4762 Sleep related leg cramps: Secondary | ICD-10-CM

## 2017-08-17 ENCOUNTER — Other Ambulatory Visit: Payer: Self-pay

## 2017-08-17 ENCOUNTER — Encounter: Payer: Self-pay | Admitting: Nurse Practitioner

## 2017-08-17 ENCOUNTER — Ambulatory Visit (INDEPENDENT_AMBULATORY_CARE_PROVIDER_SITE_OTHER): Payer: BLUE CROSS/BLUE SHIELD | Admitting: Nurse Practitioner

## 2017-08-17 VITALS — BP 121/70 | HR 79 | Temp 98.2°F | Ht 64.0 in | Wt 184.4 lb

## 2017-08-17 DIAGNOSIS — Z0000 Encounter for general adult medical examination without abnormal findings: Secondary | ICD-10-CM | POA: Diagnosis not present

## 2017-08-17 NOTE — Patient Instructions (Addendum)
Katelyn Holland, Thank you for coming in to clinic today.  1. Your exam is normal.  Please schedule a follow-up appointment with Cassell Smiles, AGNP. Return in 1 year (on 08/17/2018) for annual physical AND 6 months kidney function.  If you have any other questions or concerns, please feel free to call the clinic or send a message through [place]. You may also schedule an earlier appointment if necessary.  You will receive a survey after today's visit either digitally by e-mail or paper by [employer]. Your experiences and feedback matter to [place].  Please respond so we know how we are doing as we provide care for you.   Cassell Smiles, DNP, AGNP-BC Adult Gerontology Nurse Practitioner [facility], [place] Maintenance, Female Adopting a healthy lifestyle and getting preventive care can go a long way to promote health and wellness. Talk with your health care provider about what schedule of regular examinations is right for you. This is a good chance for you to check in with your provider about disease prevention and staying healthy. In between checkups, there are plenty of things you can do on your own. Experts have done a lot of research about which lifestyle changes and preventive measures are most likely to keep you healthy. Ask your health care provider for more information. Weight and diet Eat a healthy diet  Be sure to include plenty of vegetables, fruits, low-fat dairy products, and lean protein.  Do not eat a lot of foods high in solid fats, added sugars, or salt.  Get regular exercise. This is one of the most important things you can do for your health. ? Most adults should exercise for at least 150 minutes each week. The exercise should increase your heart rate and make you sweat (moderate-intensity exercise). ? Most adults should also do strengthening exercises at least twice a week. This is in addition to the moderate-intensity exercise.  Maintain a healthy  weight  Body mass index (BMI) is a measurement that can be used to identify possible weight problems. It estimates body fat based on height and weight. Your health care provider can help determine your BMI and help you achieve or maintain a healthy weight.  For females 7 years of age and older: ? A BMI below 18.5 is considered underweight. ? A BMI of 18.5 to 24.9 is normal. ? A BMI of 25 to 29.9 is considered overweight. ? A BMI of 30 and above is considered obese.  Watch levels of cholesterol and blood lipids  You should start having your blood tested for lipids and cholesterol at 44 years of age, then have this test every 5 years.  You may need to have your cholesterol levels checked more often if: ? Your lipid or cholesterol levels are high. ? You are older than 44 years of age. ? You are at high risk for heart disease.  Cancer screening Lung Cancer  Lung cancer screening is recommended for adults 95-69 years old who are at high risk for lung cancer because of a history of smoking.  A yearly low-dose CT scan of the lungs is recommended for people who: ? Currently smoke. ? Have quit within the past 15 years. ? Have at least a 30-pack-year history of smoking. A pack year is smoking an average of one pack of cigarettes a day for 1 year.  Yearly screening should continue until it has been 15 years since you quit.  Yearly screening should stop if you develop  a health problem that would prevent you from having lung cancer treatment.  Breast Cancer  Practice breast self-awareness. This means understanding how your breasts normally appear and feel.  It also means doing regular breast self-exams. Let your health care provider know about any changes, no matter how small.  If you are in your 20s or 30s, you should have a clinical breast exam (CBE) by a health care provider every 1-3 years as part of a regular health exam.  If you are 59 or older, have a CBE every year. Also consider  having a breast X-ray (mammogram) every year.  If you have a family history of breast cancer, talk to your health care provider about genetic screening.  If you are at high risk for breast cancer, talk to your health care provider about having an MRI and a mammogram every year.  Breast cancer gene (BRCA) assessment is recommended for women who have family members with BRCA-related cancers. BRCA-related cancers include: ? Breast. ? Ovarian. ? Tubal. ? Peritoneal cancers.  Results of the assessment will determine the need for genetic counseling and BRCA1 and BRCA2 testing.  Cervical Cancer Your health care provider may recommend that you be screened regularly for cancer of the pelvic organs (ovaries, uterus, and vagina). This screening involves a pelvic examination, including checking for microscopic changes to the surface of your cervix (Pap test). You may be encouraged to have this screening done every 3 years, beginning at age 21.  For women ages 29-65, health care providers may recommend pelvic exams and Pap testing every 3 years, or they may recommend the Pap and pelvic exam, combined with testing for human papilloma virus (HPV), every 5 years. Some types of HPV increase your risk of cervical cancer. Testing for HPV may also be done on women of any age with unclear Pap test results.  Other health care providers may not recommend any screening for nonpregnant women who are considered low risk for pelvic cancer and who do not have symptoms. Ask your health care provider if a screening pelvic exam is right for you.  If you have had past treatment for cervical cancer or a condition that could lead to cancer, you need Pap tests and screening for cancer for at least 20 years after your treatment. If Pap tests have been discontinued, your risk factors (such as having a new sexual partner) need to be reassessed to determine if screening should resume. Some women have medical problems that increase  the chance of getting cervical cancer. In these cases, your health care provider may recommend more frequent screening and Pap tests.  Colorectal Cancer  This type of cancer can be detected and often prevented.  Routine colorectal cancer screening usually begins at 44 years of age and continues through 44 years of age.  Your health care provider may recommend screening at an earlier age if you have risk factors for colon cancer.  Your health care provider may also recommend using home test kits to check for hidden blood in the stool.  A small camera at the end of a tube can be used to examine your colon directly (sigmoidoscopy or colonoscopy). This is done to check for the earliest forms of colorectal cancer.  Routine screening usually begins at age 62.  Direct examination of the colon should be repeated every 5-10 years through 44 years of age. However, you may need to be screened more often if early forms of precancerous polyps or small growths are found.  Skin Cancer  Check your skin from head to toe regularly.  Tell your health care provider about any new moles or changes in moles, especially if there is a change in a mole's shape or color.  Also tell your health care provider if you have a mole that is larger than the size of a pencil eraser.  Always use sunscreen. Apply sunscreen liberally and repeatedly throughout the day.  Protect yourself by wearing long sleeves, pants, a wide-brimmed hat, and sunglasses whenever you are outside.  Heart disease, diabetes, and high blood pressure  High blood pressure causes heart disease and increases the risk of stroke. High blood pressure is more likely to develop in: ? People who have blood pressure in the high end of the normal range (130-139/85-89 mm Hg). ? People who are overweight or obese. ? People who are African American.  If you are 4-68 years of age, have your blood pressure checked every 3-5 years. If you are 79 years of age  or older, have your blood pressure checked every year. You should have your blood pressure measured twice-once when you are at a hospital or clinic, and once when you are not at a hospital or clinic. Record the average of the two measurements. To check your blood pressure when you are not at a hospital or clinic, you can use: ? An automated blood pressure machine at a pharmacy. ? A home blood pressure monitor.  If you are between 12 years and 52 years old, ask your health care provider if you should take aspirin to prevent strokes.  Have regular diabetes screenings. This involves taking a blood sample to check your fasting blood sugar level. ? If you are at a normal weight and have a low risk for diabetes, have this test once every three years after 44 years of age. ? If you are overweight and have a high risk for diabetes, consider being tested at a younger age or more often. Preventing infection Hepatitis B  If you have a higher risk for hepatitis B, you should be screened for this virus. You are considered at high risk for hepatitis B if: ? You were born in a country where hepatitis B is common. Ask your health care provider which countries are considered high risk. ? Your parents were born in a high-risk country, and you have not been immunized against hepatitis B (hepatitis B vaccine). ? You have HIV or AIDS. ? You use needles to inject street drugs. ? You live with someone who has hepatitis B. ? You have had sex with someone who has hepatitis B. ? You get hemodialysis treatment. ? You take certain medicines for conditions, including cancer, organ transplantation, and autoimmune conditions.  Hepatitis C  Blood testing is recommended for: ? Everyone born from [birth year] through 1967. ? Anyone with known risk factors for hepatitis C.  Sexually transmitted infections (STIs)  You should be screened for sexually transmitted infections (STIs) including gonorrhea and chlamydia if: ? You are  sexually active and are younger than 44 years of age. ? You are older than 44 years of age and your health care provider tells you that you are at risk for this type of infection. ? Your sexual activity has changed since you were last screened and you are at an increased risk for chlamydia or gonorrhea. Ask your health care provider if you are at risk.  If you do not have HIV, but are at risk, it may be recommended that you  take a prescription medicine daily to prevent HIV infection. This is called pre-exposure prophylaxis (PrEP). You are considered at risk if: ? You are sexually active and do not regularly use condoms or know the HIV status of your partner(s). ? You take drugs by injection. ? You are sexually active with a partner who has HIV.  Talk with your health care provider about whether you are at high risk of being infected with HIV. If you choose to begin PrEP, you should first be tested for HIV. You should then be tested every 3 months for as long as you are taking PrEP. Pregnancy  If you are premenopausal and you may become pregnant, ask your health care provider about preconception counseling.  If you may become pregnant, take 400 to 800 micrograms (mcg) of folic acid every day.  If you want to prevent pregnancy, talk to your health care provider about birth control (contraception). Osteoporosis and menopause  Osteoporosis is a disease in which the bones lose minerals and strength with aging. This can result in serious bone fractures. Your risk for osteoporosis can be identified using a bone density scan.  If you are 38 years of age or older, or if you are at risk for osteoporosis and fractures, ask your health care provider if you should be screened.  Ask your health care provider whether you should take a calcium or vitamin D supplement to lower your risk for osteoporosis.  Menopause may have certain physical symptoms and risks.  Hormone replacement therapy may reduce some  of these symptoms and risks. Talk to your health care provider about whether hormone replacement therapy is right for you. Follow these instructions at home:  Schedule regular health, dental, and eye exams.  Stay current with your immunizations.  Do not use any tobacco products including cigarettes, chewing tobacco, or electronic cigarettes.  If you are pregnant, do not drink alcohol.  If you are breastfeeding, limit how much and how often you drink alcohol.  Limit alcohol intake to no more than 1 drink per day for nonpregnant women. One drink equals 12 ounces of beer, 5 ounces of wine, or 1 ounces of hard liquor.  Do not use street drugs.  Do not share needles.  Ask your health care provider for help if you need support or information about quitting drugs.  Tell your health care provider if you often feel depressed.  Tell your health care provider if you have ever been abused or do not feel safe at home. This information is not intended to replace advice given to you by your health care provider. Make sure you discuss any questions you have with your health care provider. Document Released: 01/10/2011 Document Revised: 12/03/2015 Document Reviewed: 03/31/2015 Elsevier Interactive Patient Education  [employer].

## 2017-08-17 NOTE — Progress Notes (Signed)
Subjective:    Patient ID: Katelyn Holland, female    DOB: [birth year], 42 y.o.   MRN: [id number]  Katelyn Holland is a 42 y.o. female presenting on 08/17/2017 for Annual Exam   HPI Annual Physical Exam Patient has been feeling well.  They have acute concerns today about weight. Sleeps 7 hours per night uninterrupted.  HEALTH MAINTENANCE: Weight/BMI: steadily gaining Physical activity: work and 4 miles every day 7 days per week Diet: no fried foods, no bread - No breakfast.  - Lunch: crackers (6 pb)  240 cal - Dinner: chicken (baked) 1/2 breast, no vegetables  180 calories - Snack: apple (80 cal) 8:30 pm - bedtime at 9 pm  Seatbelt: always Sunscreen: never PAP: none Mammogram: plan to start at age 5 HIV: declined Optometry: Dr. Clydene Pugh - appointment coming up Dentistry: regular every 6 month visits  VACCINES: Tetanus: 2015 Influenza: declines   Social History   Tobacco Use  . Smoking status: Never Smoker  . Smokeless tobacco: Never Used  Substance Use Topics  . Alcohol use: Yes    Alcohol/week: 0.0 - 0.6 oz    Comment: occasional 1 every couple of months  . Drug use: No    Review of Systems Per HPI unless specifically indicated above     Objective:    BP 121/70 (BP Location: Right Arm, Patient Position: Sitting, Cuff Size: Normal)   Pulse 79   Temp 98.2 F (36.8 C) (Oral)   Ht 5\' 4"  (1.626 m)   Wt 184 lb 6.4 oz (83.6 kg)   BMI 31.65 kg/m   Wt Readings from Last 3 Encounters:  08/17/17 184 lb 6.4 oz (83.6 kg)  07/25/17 188 lb (85.3 kg)  07/22/17 175 lb (79.4 kg)    Physical Exam General - obese, well-appearing, NAD HEENT - Normocephalic, atraumatic, PERRL, EOMI, patent nares w/o congestion, oropharynx clear, MMM Neck - supple, non-tender, no LAD, no thyromegaly, no carotid bruit Heart - RRR, no murmurs heard Lungs - Clear throughout all lobes, no wheezing, crackles, or rhonchi. Normal work of breathing. Abdomen - soft, NTND, no masses, no  hepatosplenomegaly, active bowel sounds GU - Pt declined today.  Has GYN appointment in next 1 month.  Breast - Normal exam w/ symmetric breasts, no mass, no nipple discharge, no skin changes or tenderness.  Extremeties - non-tender, no edema, cap refill < 2 seconds, peripheral pulses intact +2 bilaterally Skin - warm, dry, no rashes Neuro - awake, alert, oriented x3, CN II-X intact, intact muscle strength 5/5 bilaterally, intact distal sensation to light touch, normal coordination, normal gait Psych - Normal mood and affect, normal behavior    Results for orders placed or performed during the hospital encounter of 07/22/17  Group A Strep by PCR  Result Value Ref Range   Group A Strep by PCR NOT DETECTED NOT DETECTED  Influenza panel by PCR (type A & B)  Result Value Ref Range   Influenza A By PCR NEGATIVE NEGATIVE   Influenza B By PCR NEGATIVE NEGATIVE      Assessment & Plan:   Problem List Items Addressed This Visit    None    Visit Diagnoses    Annual physical exam    -  Primary   Relevant Orders   CBC with Differential   Comprehensive metabolic panel   Lipid panel   Hemoglobin A1c   TSH     Physical exam with no new findings.  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  Plan: 1. Obtain health maintenance screenings. - Labs as above. - Defer mammogram to start until age 89.  Pt with informed decision-making conversation with ACS, ACOG guildelines vs USPSTF guidelines discussed. 2. Return 1 year for annual physical, 6 mos Kidney function assessment/recheck.   Follow up plan: Return in 1 year (on 08/17/2018) for annual physical AND 6 months kidney function.  Katelyn McardleLauren Moria Brophy, DNP, AGPCNP-BC Adult Gerontology Primary Care Nurse Practitioner [facility] 08/17/2017, 3:49 PM

## 2017-08-21 ENCOUNTER — Encounter: Payer: Self-pay | Admitting: Nurse Practitioner

## 2017-08-29 ENCOUNTER — Telehealth: Payer: Self-pay | Admitting: Nurse Practitioner

## 2017-08-29 NOTE — Telephone Encounter (Signed)
Pt asked if she could have something called in for a yeast infection [phone]

## 2017-08-30 ENCOUNTER — Telehealth: Payer: Self-pay

## 2017-08-30 NOTE — Telephone Encounter (Signed)
Pt is not on antibiotics and no definite known cause of infection.  Pt should make appointment or use an OTC formulation.

## 2017-08-30 NOTE — Telephone Encounter (Signed)
Suggest patient to schedule an appoint ment as per Leotis ShamesLauren.

## 2017-08-30 NOTE — Telephone Encounter (Signed)
Patient decided to try OTC med's instead of scheduling appointment next day appointment was offered.

## 2017-09-28 ENCOUNTER — Other Ambulatory Visit: Payer: Self-pay | Admitting: Family Medicine

## 2017-09-28 ENCOUNTER — Other Ambulatory Visit: Payer: Self-pay | Admitting: Nurse Practitioner

## 2017-09-28 DIAGNOSIS — G4762 Sleep related leg cramps: Secondary | ICD-10-CM

## 2017-09-28 MED ORDER — ACYCLOVIR 400 MG PO TABS: 400.0000 mg | ORAL_TABLET | Freq: Two times a day (BID) | ORAL | 5 refills | Status: DC

## 2017-10-04 ENCOUNTER — Ambulatory Visit: Payer: BLUE CROSS/BLUE SHIELD | Admitting: Family Medicine

## 2017-10-04 ENCOUNTER — Encounter: Payer: Self-pay | Admitting: Family Medicine

## 2017-10-04 VITALS — BP 108/59 | HR 74 | Temp 98.6°F | Resp 16 | Ht 64.0 in | Wt 185.0 lb

## 2017-10-04 DIAGNOSIS — B001 Herpesviral vesicular dermatitis: Secondary | ICD-10-CM

## 2017-10-04 MED ORDER — VALACYCLOVIR HCL 1 G PO TABS: ORAL_TABLET | ORAL | 0 refills | Status: DC

## 2017-10-04 MED ORDER — PREDNISONE 10 MG PO TABS: ORAL_TABLET | ORAL | 0 refills | Status: DC

## 2017-10-04 NOTE — Progress Notes (Signed)
Subjective:    Patient ID: Katelyn Holland, female    DOB: 1975-09-06, 42 y.o.   MRN: [id number]  Katelyn Holland is a 42 y.o. female presenting on 10/04/2017 for Mouth Lesions (onset week hasd tried OTC and valtrex not improving)  Patient presents for a same day appointment.  HPI   HSV1 ORAL COLD SORES, RECURRENT Reports onset 1 week ago with upper lip blister formation, first symptoms typical flare with itching localized to upper lip, and thinks this may have been triggered with significant sun exposure last week while outdoors, and then she used Acyclovir 400mg  twice daily as she normally does, but it did not resolve and seemed to be larger area on upper lip and not healing as expect. 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 Smoking status: Never Smoker  . Smokeless tobacco: Never Used  Substance Use Topics  . Alcohol use: Yes    Alcohol/week: 0.0 - 0.6 oz    Comment: occasional 1 every couple of months  . Drug use: No    Review of Systems Per HPI unless specifically indicated above     Objective:    BP (!) 108/59   Pulse 74   Temp 98.6 F (37 C) (Oral)   Resp 16   Ht 5\' 4"  (1.626 m)   Wt 185 lb (83.9 kg)   BMI 31.76 kg/m   Wt Readings from Last 3 Encounters:  10/04/17 185 lb (83.9 kg)  08/17/17 184 lb 6.4 oz (83.6 kg)  07/25/17 188 lb (85.3 kg)    Physical Exam  Constitutional: She is oriented to person, place, and time. She appears well-developed and  well-nourished. No distress.  Well-appearing, comfortable, cooperative  HENT:  Head: Normocephalic and atraumatic.  Mouth/Throat: Oropharynx is clear and moist.  No oral ulceration or other lesions  Eyes: Conjunctivae are normal. Right eye exhibits no discharge. Left eye exhibits no discharge.  Cardiovascular: Normal rate.  Pulmonary/Chest: Effort normal.  Musculoskeletal: She exhibits no edema.  Neurological: She is alert and oriented to person, place, and time.  Skin: Skin is warm and dry. Rash (>50% of upper lip with 2-3 distinct spots of cold sore with dry flaking denuded skin with scab like tissue, without extension or secondary swelling or erythema) noted. She is not diaphoretic. No erythema.  Psychiatric: She has a normal mood and affect. Her behavior is normal.  Well groomed, good eye contact, normal speech and thoughts  Nursing note and vitals reviewed.   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 in years Poorly controlled with 3-4 flares per year Limited relief on Acyclovir 400 BID, may be too low of dose for her Not on suppression but may benefit  Plan - Stop Acyclovir - Start Valacyclovir instead 1g tabs - may take 2g daily for suppression (after this resolves), in meantime, she may take 2 pills twice daily for 1 day, OR may take 1 pill twice daily for 5 to 10 days or until resolves - Additionally given a 6 day prednisone taper 60 to 10mg  for helping resolve current flare, uncertain if this will be  effective for her, but discussion on limited other options can work for other similar sores - Continue topicals Follow-up as needed if not improved      Relevant Medications   valACYclovir (VALTREX) 1000 MG tablet   predniSONE (DELTASONE) 10 MG tablet      Meds ordered this encounter  Medications  . valACYclovir (VALTREX) 1000 MG tablet    Sig: For suppression take 2 pills daily. For flare, may take 2 pills twice a day for 1 day. If need can reduce to 1 pill twice daily 5 to 10 days    Dispense:  20 tablet    Refill:  0  . predniSONE (DELTASONE) 10 MG tablet    Sig: Take 6 tabs with breakfast Day 1, 5 tabs Day 2, 4 tabs Day 3, 3 tabs Day 4, 2 tabs Day 5, 1 tab Day 6.    Dispense:  21 tablet    Refill:  0    Follow up plan: Return in about 2 weeks (around 10/18/2017), or if symptoms worsen or fail to improve, for cold sore.  Saralyn Pilar, DO [facility] 10/04/2017, 9:48 AM

## 2017-10-04 NOTE — Patient Instructions (Addendum)
Thank you for coming to the office today.  Oral cold sores Switch treatment now, stop Acyclovir this was a lower dose medicine  Now take Valtrex higher dose  For suppression take 2 pills (2g) daily. For flare, may take 2 pills twice daily for 1 day. If not resolved, can reduce to 1 pill twice daily for 5 to 10 days  May take prednisone taper now to see if can help it heal faster, it may or may not work  If not improving or worsening symptoms, may need to get evaluation by Dermatology if involving inside mouth or other areas  Please schedule a Follow-up Appointment to: Return in about 2 weeks (around 10/18/2017), or if symptoms worsen or fail to improve, for cold sore.  If you have any other questions or concerns, please feel free to call the office or send a message through MyChart. You may also schedule an earlier appointment if necessary.  Additionally, you may be receiving a survey about your experience at our office within a few days to 1 week by e-mail or mail. We value your feedback.  Saralyn PilarAlexander Quynn Vilchis, DO [facility]

## 2017-10-04 NOTE — Assessment & Plan Note (Signed)
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 in years Poorly controlled with 3-4 flares per year Limited relief on Acyclovir 400 BID, may be too low of dose for her Not on suppression but may benefit  Plan - Stop Acyclovir - Start Valacyclovir instead 1g tabs - may take 2g daily for suppression (after this resolves), in meantime, she may take 2 pills twice daily for 1 day, OR may take 1 pill twice daily for 5 to 10 days or until resolves - Additionally given a 6 day prednisone taper 60 to 10mg  for helping resolve current flare, uncertain if this will be effective for her, but discussion on limited other options can work for other similar sores - Continue topicals Follow-up as needed if not improved

## 2017-10-12 ENCOUNTER — Encounter: Payer: Self-pay | Admitting: Nurse Practitioner

## 2017-10-12 ENCOUNTER — Ambulatory Visit (INDEPENDENT_AMBULATORY_CARE_PROVIDER_SITE_OTHER): Payer: BLUE CROSS/BLUE SHIELD | Admitting: Nurse Practitioner

## 2017-10-12 ENCOUNTER — Other Ambulatory Visit: Payer: Self-pay

## 2017-10-12 DIAGNOSIS — L249 Irritant contact dermatitis, unspecified cause: Secondary | ICD-10-CM

## 2017-10-12 DIAGNOSIS — B001 Herpesviral vesicular dermatitis: Secondary | ICD-10-CM

## 2017-10-12 MED ORDER — VALACYCLOVIR HCL 500 MG PO TABS: ORAL_TABLET | ORAL | 2 refills | Status: DC

## 2017-10-12 MED ORDER — TRIAMCINOLONE ACETONIDE 0.1 % EX CREA: 1.0000 "application " | TOPICAL_CREAM | Freq: Two times a day (BID) | CUTANEOUS | 0 refills | Status: DC

## 2017-10-12 NOTE — Patient Instructions (Addendum)
Katelyn Holland,   Thank you for coming in to clinic today.  1. You have a contact dermatitis or allergy. - Avoid contact in future if you can identify this allergy. - START triamcinolone 0.1% twice daily for 14 days.  Please schedule a follow-up appointment with Wilhelmina Mcardle, AGNP. Return 5-7 days if symptoms worsen or fail to improve.  If you have any other questions or concerns, please feel free to call the clinic or send a message through MyChart. You may also schedule an earlier appointment if necessary.  You will receive a survey after today's visit either digitally by e-mail or paper by [employer]. Your experiences and feedback matter to [place].  Please respond so we know how we are doing as we provide care for you.   Wilhelmina Mcardle, DNP, AGNP-BC Adult Gerontology Nurse Practitioner [facility], [facility]    Contact Dermatitis Dermatitis is redness, soreness, and swelling (inflammation) of the skin. Contact dermatitis is a reaction to certain substances that touch the skin. There are two types of contact dermatitis:  Irritant contact dermatitis. This type is caused by something that irritates your skin, such as dry hands from washing them too much. This type does not require previous exposure to the substance for a reaction to occur. This type is more common.  Allergic contact dermatitis. This type is caused by a substance that you are allergic to, such as a nickel allergy or poison ivy. This type only occurs if you have been exposed to the substance (allergen) before. Upon a repeat exposure, your body reacts to the substance. This type is less common.  What are the causes? Many different substances can cause contact dermatitis. Irritant contact dermatitis is most commonly caused by exposure to:  Makeup.  Soaps.  Detergents.  Bleaches.  Acids.  Metal salts, such as nickel.  Allergic contact dermatitis is most commonly caused by exposure to:  Poisonous  plants.  Chemicals.  Jewelry.  Latex.  Medicines.  Preservatives in products, such as clothing.  What increases the risk? This condition is more likely to develop in:  People who have jobs that expose them to irritants or allergens.  People who have certain medical conditions, such as asthma or eczema.  What are the signs or symptoms? Symptoms of this condition may occur anywhere on your body where the irritant has touched you or is touched by you. Symptoms include:  Dryness or flaking.  Redness.  Cracks.  Itching.  Pain or a burning feeling.  Blisters.  Drainage of small amounts of blood or clear fluid from skin cracks.  With allergic contact dermatitis, there may also be swelling in areas such as the eyelids, mouth, or genitals. How is this diagnosed? This condition is diagnosed with a medical history and physical exam. A patch skin test may be performed to help determine the cause. If the condition is related to your job, you may need to see an occupational medicine specialist. How is this treated? Treatment for this condition includes figuring out what caused the reaction and protecting your skin from further contact. Treatment may also include:  Steroid creams or ointments. Oral steroid medicines may be needed in more severe cases.  Antibiotics or antibacterial ointments, if a skin infection is present.  Antihistamine lotion or an antihistamine taken by mouth to ease itching.  A bandage (dressing).  Follow these instructions at home: Skin Care  Moisturize your skin as needed.  Apply cool compresses to the affected areas.  Try  taking a bath with: ? Epsom salts. Follow the instructions on the packaging. You can get these at your local pharmacy or grocery store. ? Baking soda. Pour a small amount into the bath as directed by your health care provider. ? Colloidal oatmeal. Follow the instructions on the packaging. You can get this at your local pharmacy or  grocery store.  Try applying baking soda paste to your skin. Stir water into baking soda until it reaches a paste-like consistency.  Do not scratch your skin.  Bathe less frequently, such as every other day.  Bathe in lukewarm water. Avoid using hot water. Medicines  Take or apply over-the-counter and prescription medicines only as told by your health care provider.  If you were prescribed an antibiotic medicine, take or apply your antibiotic as told by your health care provider. Do not stop using the antibiotic even if your condition starts to improve. General instructions  Keep all follow-up visits as told by your health care provider. This is important.  Avoid the substance that caused your reaction. If you do not know what caused it, keep a journal to try to track what caused it. Write down: ? What you eat. ? What cosmetic products you use. ? What you drink. ? What you wear in the affected area. This includes jewelry.  If you were given a dressing, take care of it as told by your health care provider. This includes when to change and remove it. Contact a health care provider if:  Your condition does not improve with treatment.  Your condition gets worse.  You have signs of infection such as swelling, tenderness, redness, soreness, or warmth in the affected area.  You have a fever.  You have new symptoms. Get help right away if:  You have a severe headache, neck pain, or neck stiffness.  You vomit.  You feel very sleepy.  You notice red streaks coming from the affected area.  Your bone or joint underneath the affected area becomes painful after the skin has healed.  The affected area turns darker.  You have difficulty breathing. This information is not intended to replace advice given to you by your health care provider. Make sure you discuss any questions you have with your health care provider. Document Released: 06/24/2000 Document Revised: 12/03/2015  Document Reviewed: 11/12/2014 Elsevier Interactive Patient Education  2018 [employer]

## 2017-10-12 NOTE — Progress Notes (Signed)
Subjective:    Patient ID: Katelyn Holland, female    DOB: 13-Oct-1975, 42 y.o.   MRN: [id number]  Katelyn Holland is a 42 y.o. female presenting on 10/12/2017 for Rash (bilateral hands, ankles x 1 day. Pt just finished predinosone x 2 days ago for cold sore )   HPI Rash Pt presents today with acute rash that appeared one day ago that is on bilateral hands and ankles.  Rash is nonpruritic. -No new products, soaps, detergents are noted for patient.  Patient with very sensitive skin history with frequent contact dermatitis. -New medication valacyclovir instead of acyclovir for cold sore with prednisone course completed 2 days ago. -Only new product patient recalls in the last week is a new wire with dusty coating at work. -Patient states she has a puppy who may have come in contact with an irritant or allergy.  She notes her husband is very allergic to poison oak and has no rash, so unlikely as poison oak or poison ivy contact. Patient has taken Benadryl over-the-counter twice daily without relief.  Social History   Tobacco Use  . Smoking status: Never Smoker  . Smokeless tobacco: Never Used  Substance Use Topics  . Alcohol use: Yes    Alcohol/week: 0.0 - 0.6 oz    Comment: occasional 1 every couple of months  . Drug use: No    Review of Systems Per HPI unless specifically indicated above     Objective:    BP 122/74 (BP Location: Right Arm)   Pulse 86   Temp 97.8 F (36.6 C) (Oral)   Ht 5\' 4"  (1.626 m)   Wt 186 lb (84.4 kg)   BMI 31.93 kg/m   Wt Readings from Last 3 Encounters:  10/12/17 186 lb (84.4 kg)  10/04/17 185 lb (83.9 kg)  08/17/17 184 lb 6.4 oz (83.6 kg)    Physical Exam  Constitutional: She is oriented to person, place, and time. She appears well-developed and well-nourished. No distress.  HENT:  Head: Normocephalic and atraumatic.  Neurological: She is alert and oriented to person, place, and time.  Skin: Skin is warm and dry.     Psychiatric: She has  a normal mood and affect. Her behavior is normal.  Vitals reviewed.    Results for orders placed or performed during the hospital encounter of 07/22/17  Group A Strep by PCR  Result Value Ref Range   Group A Strep by PCR NOT DETECTED NOT DETECTED  Influenza panel by PCR (type A & B)  Result Value Ref Range   Influenza A By PCR NEGATIVE NEGATIVE   Influenza B By PCR NEGATIVE NEGATIVE      Assessment & Plan:   Problem List Items Addressed This Visit      Digestive   Recurrent cold sores Patient was started on suppressive therapy with valacyclovir and has had resolution of cold sore symptoms.  She has no refills remaining.  Requests refill today.  Refills provided.  Continue valacyclovir 500 mg once daily.  Take 2 tablets twice daily at onset of flare.   Relevant Medications   valACYclovir (VALTREX) 500 MG tablet    Other Visit Diagnoses    Irritant contact dermatitis, unspecified trigger     Acute contact dermatitis to unspecified trigger.  Likely is new dust decoding on a wire product she uses at work.  Plan: 1.  Start triamcinolone application twice daily for 14 days. 2.  Work to identify trigger and avoid. 3.  Consider  repeat prednisone course, however patient has just completed prednisone. 4.  Follow-up as needed   Relevant Medications   triamcinolone cream (KENALOG) 0.1 %      Meds ordered this encounter  Medications  . triamcinolone cream (KENALOG) 0.1 %    Sig: Apply 1 application topically 2 (two) times daily for 14 days.    Dispense:  30 g    Refill:  0    Order Specific Question:   Supervising Provider    Answer:   Smitty CordsKARAMALEGOS, ALEXANDER J [[id number]]  . valACYclovir (VALTREX) 500 MG tablet    Sig: For suppression take 1 pill once daily. For flare, may take 2 pills twice a day for 1 day.    Dispense:  30 tablet    Refill:  2    Order Specific Question:   Supervising Provider    Answer:   Smitty CordsKARAMALEGOS, ALEXANDER J [[id number]]    Follow up plan: Return 5-7 days if  symptoms worsen or fail to improve.  Wilhelmina McardleLauren Zetta Stoneman, DNP, AGPCNP-BC Adult Gerontology Primary Care Nurse Practitioner [facility] 10/12/2017, 4:47 PM

## 2017-10-19 ENCOUNTER — Other Ambulatory Visit: Payer: Self-pay | Admitting: Nurse Practitioner

## 2017-10-19 DIAGNOSIS — L249 Irritant contact dermatitis, unspecified cause: Secondary | ICD-10-CM

## 2017-10-19 MED ORDER — TRIAMCINOLONE ACETONIDE 0.1 % EX CREA: 1.0000 "application " | TOPICAL_CREAM | Freq: Two times a day (BID) | CUTANEOUS | 0 refills | Status: AC

## 2017-10-20 ENCOUNTER — Other Ambulatory Visit: Payer: Self-pay

## 2017-10-20 ENCOUNTER — Encounter: Payer: Self-pay | Admitting: Nurse Practitioner

## 2017-10-20 ENCOUNTER — Ambulatory Visit (INDEPENDENT_AMBULATORY_CARE_PROVIDER_SITE_OTHER): Payer: BLUE CROSS/BLUE SHIELD | Admitting: Nurse Practitioner

## 2017-10-20 VITALS — BP 126/72 | HR 89 | Temp 98.4°F | Ht 64.0 in | Wt 184.6 lb

## 2017-10-20 DIAGNOSIS — L245 Irritant contact dermatitis due to other chemical products: Secondary | ICD-10-CM | POA: Diagnosis not present

## 2017-10-20 MED ORDER — METHYLPREDNISOLONE ACETATE 40 MG/ML IJ SUSP
40.0000 mg | Freq: Once | INTRAMUSCULAR | Status: AC
  Administered 2017-10-20: 40 mg via INTRAMUSCULAR

## 2017-10-20 MED ORDER — PREDNISONE 10 MG PO TABS
ORAL_TABLET | ORAL | 0 refills | Status: DC
Start: 2017-10-20 — End: 2017-12-20

## 2017-10-20 NOTE — Patient Instructions (Addendum)
Katelyn Holland,   Thank you for coming in to clinic today.  1. Continue off benadryl.  2. Take prednisone taper 10 mg tablets Day 1 (Today): Take 6 pills at one time Day 2: Take 6 pills  Day 3: Take 5 pills Day 4: Take 4 pills Day 5: Take 3 pills Day 6: Take 2 pills Day 7: Take 1 pill then stop.  3. You received a depomedrol steroid injection in clinic today.  Please schedule a follow-up appointment with Wilhelmina McardleLauren Xandria Gallaga, AGNP. Return 7-10 days if symptoms worsen or fail to improve.  If you have any other questions or concerns, please feel free to call the clinic or send a message through MyChart. You may also schedule an earlier appointment if necessary.  You will receive a survey after today's visit either digitally by e-mail or paper by [employer] mail. Your experiences and feedback matter to us.  Please respond so we know how we are doing as we provide care for you.   Wilhelmina McardleLauren Keane Martelli, DNP, AGNP-BC Adult Gerontology Nurse Practitioner [facility], [facility]

## 2017-10-20 NOTE — Progress Notes (Signed)
Subjective:    Patient ID: Katelyn Holland, female    DOB: 1976-02-12, 42 y.o.   MRN: [id number]  Katelyn Holland is a 42 y.o. female presenting on 10/20/2017 for Rash (bilateral hands, ankle and feet x 8 days. Pt currently taking benadryl, but have not notice  any improvement)   HPI Rash Patient presents 7 days after initial evaluation for irritant contact dermatitis on 10/12/2017.  Rash is persistent with mild worsening and spread to foot bilaterally.  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  Patient has been using triamcinolone as prescribed with mild relief of prior rash.  Patient has stopped taking Benadryl today as it was not providing much relief.  Social History   Tobacco Use  . Smoking status: Never Smoker  . Smokeless tobacco: Never Used  Substance Use Topics  . Alcohol use: Yes    Alcohol/week: 0.0 - 0.6 oz    Comment: occasional 1 every couple of months  . Drug use: No    Review of Systems Per HPI unless specifically indicated above     Objective:    BP 126/72 (BP Location: Right Arm, Patient Position: Sitting, Cuff Size: Normal)   Pulse 89   Temp 98.4 F (36.9 C) (Oral)   Ht 5\' 4"  (1.626 m)   Wt 184 lb 9.6 oz (83.7 kg)   BMI 31.69 kg/m   Wt Readings from Last 3 Encounters:  10/20/17 184 lb 9.6 oz (83.7 kg)  10/12/17 186 lb (84.4 kg)  10/04/17 185 lb (83.9 kg)    Physical Exam  Constitutional: She is oriented to person, place, and time. She appears well-developed and well-nourished. No distress.  HENT:  Head: Normocephalic and atraumatic.  Neurological: She is alert and oriented to person, place, and time.  Skin: Skin is warm and dry.     Psychiatric: She has a normal mood and affect. Her behavior is normal.  Vitals reviewed.   Results for orders placed or  performed during the hospital encounter of 07/22/17  Group A Strep by PCR  Result Value Ref Range   Group A Strep by PCR NOT DETECTED NOT DETECTED  Influenza panel by PCR (type A & B)  Result Value Ref Range   Influenza A By PCR NEGATIVE NEGATIVE   Influenza B By PCR NEGATIVE NEGATIVE      Assessment & Plan:   Problem List Items Addressed This Visit    None    Visit Diagnoses    Irritant contact dermatitis due to other chemical products    -  Primary   Relevant Medications   methylPREDNISolone acetate (DEPO-MEDROL) injection 40 mg (Completed)   predniSONE (DELTASONE) 10 MG tablet    Mildly worsened acute contact dermatitis to identified trigger of insulated coating of a specialty wire at her work.  No new exposures have occurred, but rash did continue to spread to tops of feet.  Plan: 1.  Continue triamcinolone application twice daily for 7 days. 2.  Avoid exposure to product. 3.  Repeat 7 day prednisone course.   - Start prednisone taper over 7 days Day 1-2: 60 mg, Day 3: 50 mg, Day 4: 40 mg; Day 5: 30 mg; Day 6: 20 mg; Day 7: 10 mg then stop. - Administer depo medrol 40 mg once in clinic  today. 4.  Follow-up as needed   Meds ordered this encounter  Medications  . methylPREDNISolone acetate (DEPO-MEDROL) injection 40 mg  . predniSONE (DELTASONE) 10 MG tablet    Sig: Day 1-2 take 6 pills. Day 3 take 5 pills then reduce by 1 pill each day.    Dispense:  27 tablet    Refill:  0    Order Specific Question:   Supervising Provider    Answer:   Smitty Cords [[id number]]   Follow up plan: Return 7-10 days if symptoms worsen or fail to improve.  Wilhelmina Mcardle, DNP, AGPCNP-BC Adult Gerontology Primary Care Nurse Practitioner [facility] 10/20/2017, 4:13 PM

## 2017-10-26 ENCOUNTER — Other Ambulatory Visit: Payer: Self-pay | Admitting: Nurse Practitioner

## 2017-10-26 ENCOUNTER — Telehealth: Payer: Self-pay | Admitting: Nurse Practitioner

## 2017-10-26 DIAGNOSIS — G4762 Sleep related leg cramps: Secondary | ICD-10-CM

## 2017-10-26 NOTE — Telephone Encounter (Signed)
Husband called requesting a refill on Flexeril  Call into  [facility] in [place]

## 2017-10-26 NOTE — Telephone Encounter (Signed)
Responded to by other means

## 2017-11-14 ENCOUNTER — Telehealth: Payer: Self-pay | Admitting: Nurse Practitioner

## 2017-11-14 NOTE — Telephone Encounter (Signed)
Pt needs refill on flexeril sent to [facility].  Call back [phone]

## 2017-11-15 NOTE — Telephone Encounter (Signed)
Attempted to contact the pt, no answer. LMOM to return my call.  

## 2017-11-16 NOTE — Telephone Encounter (Signed)
Attempted to contact the pt again to notify her that she have refills already. The phone call was answered and disconnected.

## 2017-12-16 ENCOUNTER — Other Ambulatory Visit: Payer: Self-pay

## 2017-12-16 ENCOUNTER — Encounter: Payer: Self-pay | Admitting: Emergency Medicine

## 2017-12-16 ENCOUNTER — Emergency Department
Admission: EM | Admit: 2017-12-16 | Discharge: 2017-12-16 | Disposition: A | Discharge status: Home / Self Care | Payer: BLUE CROSS/BLUE SHIELD | Attending: Emergency Medicine | Admitting: Emergency Medicine

## 2017-12-16 DIAGNOSIS — Z9884 Bariatric surgery status: Secondary | ICD-10-CM | POA: Insufficient documentation

## 2017-12-16 DIAGNOSIS — H9201 Otalgia, right ear: Secondary | ICD-10-CM | POA: Diagnosis not present

## 2017-12-16 DIAGNOSIS — H9203 Otalgia, bilateral: Secondary | ICD-10-CM | POA: Diagnosis present

## 2017-12-16 DIAGNOSIS — H6122 Impacted cerumen, left ear: Secondary | ICD-10-CM | POA: Diagnosis not present

## 2017-12-16 DIAGNOSIS — Z79899 Other long term (current) drug therapy: Secondary | ICD-10-CM | POA: Insufficient documentation

## 2017-12-16 MED ORDER — TRAMADOL HCL 50 MG PO TABS
50.0000 mg | ORAL_TABLET | Freq: Once | ORAL | Status: AC
  Administered 2017-12-16: 50 mg via ORAL
  Filled 2017-12-16: qty 1

## 2017-12-16 MED ORDER — NEOMYCIN-POLYMYXIN-HC 3.5-10000-1 OT SOLN: 3.0000 [drp] | Freq: Four times a day (QID) | OTIC | 0 refills | Status: AC

## 2017-12-16 NOTE — ED Provider Notes (Signed)
[facility] Emergency Department Provider Note   ____________________________________________   First MD Initiated Contact with Patient 12/16/17 1102     (approximate)  I have reviewed the triage vital signs and the nursing notes.   HISTORY  Chief Complaint Otalgia    HPI Katelyn Holland is a 42 y.o. female patient complain of bilateral ear pain.  Onset of complaint with this morning.  Patient says she was attempting to clean out ears this morning.  Patient has history of cerumen impaction with mild hearing loss.  Patient rates the pain as a 5/10.  Patient described the pain as "ear discomfort".  Past Medical History:  Diagnosis Date  . Arthritis   . Chronic constipation   . Gravida 1 para 1   . Kidney stone   . Kidney stones     Patient Active Problem List   Diagnosis Date Noted  . 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 S/P total abdominal hysterectomy 01/09/2017  . Nocturnal leg cramps 02/12/2016  . Current low back pain (Location of Primary Source of Pain) (Left) 02/11/2016  . Chronic flank pain (Left) 02/11/2016  . Encounter for pain management planning 02/11/2016  . Chronic pain 02/10/2016  . Long term current use of opiate analgesic 02/10/2016  . Long term prescription opiate use 02/10/2016  . Opiate use 02/10/2016  . 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 Hypokalemia 07/09/2015  . Anemia 07/09/2015  . Chronic constipation 07/07/2015  . Family history of heart disease in female family member before age 40 12/24/2014  . Low libido 12/24/2014  . Obesity (BMI 30.0-34.9) 04/10/2014    Past Surgical History:  Procedure Laterality Date  . ABDOMINAL HYSTERECTOMY    . APPENDECTOMY    . BACK SURGERY     x 2  . KIDNEY STONE SURGERY Left 2000   surgery through back  . LAPAROSCOPIC GASTRIC SLEEVE RESECTION  2016  . LITHOTRIPSY  2007  . NEPHRECTOMY Left   . NEPHROLITHOTOMY Left 07/23/2015   Procedure: NEPHROLITHOTOMY  PERCUTANEOUS;  Surgeon: Vanna Scotland, MD;  Location: [facility] ORS;  Service: Urology;  Laterality: Left;    Prior to Admission medications   Medication Sig Start Date End Date Taking? Authorizing Provider  cyclobenzaprine (FLEXERIL) 10 MG tablet TAKE 1 TO 2 TABLETS(10 TO 20 MG) BY MOUTH AT BEDTIME AS NEEDED FOR MUSCLE SPASMS 10/26/17   Galen Manila, NP  diphenhydrAMINE (BENADRYL) 25 mg capsule Take 50 mg by mouth 2 (two) times daily.     [provider]  neomycin-polymyxin-hydrocortisone (CORTISPORIN) OTIC solution Place 3 drops into the right ear 4 (four) times daily for 10 days. 12/16/17 12/26/17  Joni Reining, PA-C  ondansetron (ZOFRAN) 4 MG tablet Take 4 mg by mouth 2 (two) times daily.    [provider]  predniSONE (DELTASONE) 10 MG tablet Day 1-2 take 6 pills. Day 3 take 5 pills then reduce by 1 pill each day. 10/20/17   Galen Manila, NP  valACYclovir (VALTREX) 500 MG tablet For suppression take 1 pill once daily. For flare, may take 2 pills twice a day for 1 day. 10/12/17   Galen Manila, NP    Allergies Dilaudid  [hydromorphone hcl]; Iodinated diagnostic agents; and Other  Family History  Problem Relation Age of Onset  . Heart disease Mother 5  . Cancer Maternal Grandmother   . Heart disease Father   . Kidney cancer Neg Hx   . Bladder Cancer Neg Hx   . Prostate  cancer Neg Hx     Social History Social History   Tobacco Use  . Smoking status: Never Smoker  . Smokeless tobacco: Never Used  Substance Use Topics  . Alcohol use: Yes    Alcohol/week: 0.0 - 0.6 oz    Comment: occasional 1 every couple of months  . Drug use: No    Review of Systems Constitutional: No fever/chills Eyes: No visual changes. ENT: No sore throat.  Bilateral ear pain.  Hearing loss left ear. Cardiovascular: Denies chest pain. Respiratory: Denies shortness of breath. Gastrointestinal: No abdominal pain.  No nausea, no vomiting.  No diarrhea.  No  constipation. Genitourinary: Negative for dysuria. Musculoskeletal: Negative for back pain. Skin: Negative for rash. Neurological: Negative for headaches, focal weakness or numbness. Allergic/Immunilogical: See medication list. ____________________________________________   PHYSICAL EXAM:  VITAL SIGNS: ED Triage Vitals  Enc Vitals Group     BP 12/16/17 1051 109/71     Pulse Rate 12/16/17 1051 89     Resp 12/16/17 1051 16     Temp 12/16/17 1051 98.2 F (36.8 C)     Temp Source 12/16/17 1051 Oral     SpO2 12/16/17 1051 98 %     Weight 12/16/17 1049 180 lb (81.6 kg)     Height 12/16/17 1049 5\' 6"  (1.676 m)     Head Circumference --      Peak Flow --      Pain Score 12/16/17 1049 5     Pain Loc --      Pain Edu? --      Excl. in GC? --    Constitutional: Alert and oriented. Well appearing and in no acute distress. Eyes: Conjunctivae are normal. PERRL. EOMI. Head: Atraumatic. EARS: Erythematous right ear canal with mild cerumen.  TM not visible left ear canal secondary to impaction. Nose: No congestion/rhinnorhea. Hematological/Lymphatic/Immunilogical: No cervical lymphadenopathy. Cardiovascular: Normal rate, regular rhythm. Grossly normal heart sounds.  Good peripheral circulation. Respiratory: Normal respiratory effort.  No retractions. Lungs CTAB. ____________________________________________   LABS (all labs ordered are listed, but only abnormal results are displayed)  Labs Reviewed - No data to display ____________________________________________  EKG   ____________________________________________  RADIOLOGY  ED MD interpretation:    Official radiology report(s): No results found.  ____________________________________________   PROCEDURES  Procedure(s) performed: None  .Ear Cerumen Removal Date/Time: 12/16/2017 12:25 PM Performed by: Basil Dess, RN Authorized by: Joni Reining, PA-C   Consent:    Consent obtained:  Verbal   Consent given by:   Patient Procedure details:    Location:  L ear   Procedure type: irrigation   Post-procedure details:    Inspection:  TM intact   Hearing quality:  Diminished   Patient tolerance of procedure:  Tolerated well, no immediate complications    Critical Care performed:   ____________________________________________   INITIAL IMPRESSION / ASSESSMENT AND PLAN / ED COURSE  As part of my medical decision making, I reviewed the following data within the electronic MEDICAL RECORD NUMBER    Right ear pain secondary to otitis external.  Hearing loss secondary to cerebral impaction left ear.  Patient left ear was irrigated resulted in visible TM and improvement of hearing.  Patient given discharge care instruction in a prescription for Cortisporin eardrops for the right ear.  Patient advised follow-up PCP.      ____________________________________________   FINAL CLINICAL IMPRESSION(S) / ED DIAGNOSES  Final diagnoses:  Otalgia of right ear  Impacted cerumen of left ear  ED Discharge Orders        Ordered    neomycin-polymyxin-hydrocortisone (CORTISPORIN) OTIC solution  4 times daily     12/16/17 1219       Note:  This document was prepared using Dragon voice recognition software and may include unintentional dictation errors.    Joni ReiningSmith, Ronald K, PA-C 12/16/17 1227    Merrily Brittleifenbark, Neil, MD 12/17/17 1025

## 2017-12-16 NOTE — ED Notes (Signed)
Pt states she has bilateral ear pain and decreased hearing, worse today.

## 2017-12-16 NOTE — ED Triage Notes (Signed)
C/O bilateral ear pain.  Onset of symptoms this morning.  States she "cleaned her ears out really good" this morning, but has not helped discomfort.  AAOx3.  Skin warm and dry. NAD

## 2017-12-20 ENCOUNTER — Other Ambulatory Visit: Payer: Self-pay

## 2017-12-20 ENCOUNTER — Ambulatory Visit: Payer: BLUE CROSS/BLUE SHIELD | Admitting: Family Medicine

## 2017-12-20 ENCOUNTER — Encounter: Payer: Self-pay | Admitting: Family Medicine

## 2017-12-20 VITALS — BP 111/61 | HR 80 | Temp 98.7°F | Ht 66.0 in | Wt 184.4 lb

## 2017-12-20 DIAGNOSIS — H6121 Impacted cerumen, right ear: Secondary | ICD-10-CM

## 2017-12-20 DIAGNOSIS — H9201 Otalgia, right ear: Secondary | ICD-10-CM | POA: Diagnosis not present

## 2017-12-20 NOTE — Progress Notes (Signed)
Subjective:    Patient ID: Katelyn Holland, female    DOB: 1976/04/07, 42 y.o.   MRN: [id number]  Katelyn Holland is a 42 y.o. female presenting on 12/20/2017 for Ear Fullness (Rt ear discomfort w/ fulllness feeling. The pt was seen in the ER x 2 days ago and given Abx ear drop. She notice improvement w/ the pain )   HPI   ED FOLLOW-UP VISIT  Hospital/Location: [facility] Date of ED Visit: 12/16/17  Reason for Presenting to ED: Bilateral Ear Pain / Pressure Primary (+Secondary) Diagnosis: Bilateral Cerumen Impaction  FOLLOW-UP - ED provider note and record have been reviewed - Patient presents today about 4 days after recent ED visit. Brief summary of recent course, patient had symptoms of bilateral ear pain and pressure for 2 days worsening prior to ED visit. At ED they performed L ear irrigation to flush, and removed thick cerumen and her symptoms resolved. R ear appeared to be inflamed and impacted, given antibiotic ear drops there and advised to follow-up with PCP  - Today reports overall has done well after discharge from ED. Symptoms of Left ear pain and pressure have resolved after removal of impaction no further problems L ear. She has persistent issue with Right ear, still using Neomycin polymyxin antibiotic ear drops R side 3 drops 2-4 times daily with some good results, less pain and inflammation but still has fullness and pressure, and sensation of "hearing self inside head" and feels "fluid in ear", reduced hearing on R side.  Admits hearing loss R side, "muffled" or reduced, otherwise L hearing is good Admits only mild pain now it is improved R ear Denies drainage from ear, headache, sinus pressure or pain, drainage, cough, fever chills, injury or trauma   Depression screen [facility] 2/9 08/17/2017 04/20/2016 02/11/2016  Decreased Interest 0 0 0  Down, Depressed, Hopeless 0 0 0  PHQ - 2 Score 0 0 0    Social History   Tobacco Use  . Smoking status: Never Smoker  . Smokeless  tobacco: Never Used  Substance Use Topics  . Alcohol use: Yes    Alcohol/week: 0.0 - 0.6 oz    Comment: occasional 1 every couple of months  . Drug use: No    Review of Systems Per HPI unless specifically indicated above     Objective:    BP 111/61 (BP Location: Right Arm, Patient Position: Sitting, Cuff Size: Normal)   Pulse 80   Temp 98.7 F (37.1 C) (Oral)   Ht 5' 6"  (1.676 m)   Wt 184 lb 6.4 oz (83.6 kg)   BMI 29.76 kg/m   Wt Readings from Last 3 Encounters:  12/20/17 184 lb 6.4 oz (83.6 kg)  12/16/17 180 lb (81.6 kg)  10/20/17 184 lb 9.6 oz (83.7 kg)    Physical Exam  Constitutional: She is oriented to person, place, and time. She appears well-developed and well-nourished. No distress.  Well-appearing, comfortable, cooperative  HENT:  Head: Normocephalic and atraumatic.  Mouth/Throat: Oropharynx is clear and moist.  Frontal / maxillary sinuses non-tender. Nares patent without purulence or edema.  Left TM clear without erythema or effusion or bulging. No cerumen.  Right Ear - impacted by soft cerumen, appears moist, without significant inflammation or erythema or swelling, cannot view TM - No mastoid tenderness or swelling or erythema  Conversational hearing is intact, reduced on R side.  Oropharynx clear without erythema, exudates, edema or asymmetry.  Eyes: Conjunctivae are normal. Right eye exhibits no discharge.  Left eye exhibits no discharge.  Cardiovascular: Normal rate.  Pulmonary/Chest: Effort normal.  Musculoskeletal: She exhibits no edema.  Neurological: She is alert and oriented to person, place, and time.  Skin: Skin is warm and dry. No rash noted. She is not diaphoretic. No erythema.  Psychiatric: She has a normal mood and affect. Her behavior is normal.  Well groomed, good eye contact, normal speech and thoughts  Nursing note and vitals reviewed.  ________________________________________________________ PROCEDURE NOTE Date: 12/20/17 Right Ear  Lavage / Cerumen Removal Discussed benefits and risks (including pain / discomforts, dizziness, minor abrasion of ear canal). Verbal consent given by patient. Medication:  Ear Lavage Solution (warm water + hydrogen peroxide) Performed by Donnie Mesa CMA / Dr Parks Ranger Ear lavage solution flushed into Right ear only in attempt to dislodge and remove ear wax. Good results removal of impacting cerumen.  Repeat Ear Exam: - Completely removed cerumen now, with clear ear canals and visible TMs clear and normal appearance. Hearing is improved back to normal, without pain.     Assessment & Plan:   Problem List Items Addressed This Visit    None    Visit Diagnoses    Hearing loss of right ear due to cerumen impaction    -  Primary   Otalgia of right ear          Significant amount of large thick impacted cerumen Right ear, suspected primary cause of current reduced hearing and ear pain - improved after antibiotic drops for past several days  Plan: 1. Successful office ear lavage cerumen removal today, re-evaluated with clear ear canals and normal TMs 2. Counseled on avoiding Q-tips and may use Kleenex as wick, use OTC Debrox as needed with ear flush kit - can do preventatively as well every several weeks if needs 3. Follow-up as needed   No orders of the defined types were placed in this encounter.   Follow up plan: Return if symptoms worsen or fail to improve, for right ear pain / cerumen.  Nobie Putnam, [facility] 12/20/2017, 4:41 PM

## 2017-12-20 NOTE — Patient Instructions (Addendum)
Thank you for coming to the office today.  You have thick impacted ear wax (cerumen) blocking ear canals and ear drums. This is the most likely cause of reduced hearing and ear pain and discomfort.  - We were able to remove almost all of the ear wax with flushing in the office today  Recommend in the future if ear wax builds up again or can do it preventatively every 4-6 weeks depending on results, over the counter Debrox (Carbamide peroxide), use on both sides following instructions on bottle, pharmacist will direct you to the appropriate ear drops if you need help. May take a week or more.  Avoid using Q-tips inside ears, as this can push wax deeper, but you can try to use rolled up kleenex as a wick to absorb fluid and wax as well.  If you are not making progress with ear wax removal at home, or the problem keeps coming back, please notify our office or return for re-evaluation, and we can discuss referral to ENT office for more formal ear wax removal.  [facility] [address]  [place], [place] [address] Ph: [phone]  [facility] [address]  [place], [place] [address] Ph: [phone]   Please schedule a Follow-up Appointment to: Return if symptoms worsen or fail to improve, for right ear pain / cerumen.  If you have any other questions or concerns, please feel free to call the office or send a message through MyChart. You may also schedule an earlier appointment if necessary.  Additionally, you may be receiving a survey about your experience at our office within a few days to 1 week by e-mail or mail. We value your feedback.  Saralyn PilarAlexander Karamalegos, DO [facility]

## 2018-01-04 ENCOUNTER — Ambulatory Visit: Payer: BLUE CROSS/BLUE SHIELD | Admitting: Nurse Practitioner

## 2018-01-04 ENCOUNTER — Encounter: Payer: Self-pay | Admitting: Nurse Practitioner

## 2018-01-04 ENCOUNTER — Other Ambulatory Visit: Payer: Self-pay

## 2018-01-04 VITALS — BP 113/63 | HR 88 | Temp 98.4°F | Ht 66.0 in | Wt 184.0 lb

## 2018-01-04 DIAGNOSIS — R109 Unspecified abdominal pain: Secondary | ICD-10-CM | POA: Diagnosis not present

## 2018-01-04 DIAGNOSIS — R319 Hematuria, unspecified: Secondary | ICD-10-CM | POA: Diagnosis not present

## 2018-01-04 LAB — POCT URINALYSIS DIPSTICK
Bilirubin, UA: NEGATIVE
Glucose, UA: NEGATIVE
Ketones, UA: NEGATIVE
Leukocytes, UA: NEGATIVE
Nitrite, UA: NEGATIVE
Protein, UA: NEGATIVE
Spec Grav, UA: 1.015
Urobilinogen, UA: 0.2 U/dL
pH, UA: 5

## 2018-01-04 MED ORDER — CEPHALEXIN 500 MG PO CAPS: 500.0000 mg | ORAL_CAPSULE | Freq: Two times a day (BID) | ORAL | 0 refills | Status: AC

## 2018-01-04 MED ORDER — TAMSULOSIN HCL 0.4 MG PO CAPS: 0.4000 mg | ORAL_CAPSULE | Freq: Every day | ORAL | 0 refills | Status: AC

## 2018-01-04 NOTE — Patient Instructions (Addendum)
Katelyn Holland,   Thank you for coming in to clinic today.  1. You most likely have a kidney or bladder infection.  You may have a R sided kidney stone as well.  - START Keflex 500 mg twice daily for 5 days  - START flomax 0.4 mg daily for 14 - 30 days.  - [facility]  Labs tomorrow 8 - 11:30am   Please schedule a follow-up appointment with [name], AGNP. Return if symptoms worsen or fail to improve.  If you have any other questions or concerns, please feel free to call the clinic or send a message through MyChart. You may also schedule an earlier appointment if necessary.  You will receive a survey after today's visit either digitally by e-mail or paper by [employer] mail. Your experiences and feedback matter to us.  Please respond so we know how we are doing as we provide care for you.   Wilhelmina McardleLauren Dayana Dalporto, DNP, AGNP-BC Adult Gerontology Nurse Practitioner [facility]

## 2018-01-04 NOTE — Progress Notes (Signed)
Subjective:    Patient ID: Katelyn Holland, female    DOB: 09/10/1975, 42 y.o.   MRN: [id number]  Katelyn GowerCrystal M Keeble is a 42 y.o. female presenting on 01/04/2018 for Nephrolithiasis (Rt side flank pain, lightheadedness, nausea, sweating and possible blood in urine, foul urine smelll)   HPI Right Flank Pain Patient presents today with right flank pain and right sided pelvic pain onset 3 days ago.  Patient appears quite stoic in clinic today, but has moderate to severe pain.  Patient is concerned for possible infection.  She has had nephrolithiasis and recurrent pyelonephritis in the past that required removal of left kidney. - Associated symptoms include lightheadedness, sweating, nausea, dark and foul-smelling urine.  Patient reports more sweating with higher intensity of pain.  Patient also has incomplete bladder emptying, small voided amounts, urinary frequency. - Patient denies dysuria, pelvic pain, or burning after urination. - She continues to take cyclobenzaprine for muscle spasms for back pain without relief of any urinary symptoms at bedtime.  Social History   Tobacco Use  . Smoking status: Never Smoker  . Smokeless tobacco: Never Used  Substance Use Topics  . Alcohol use: Yes    Alcohol/week: 0.0 - 0.6 oz    Comment: occasional 1 every couple of months  . Drug use: No    Review of Systems Per HPI unless specifically indicated above     Objective:    BP 113/63 (BP Location: Right Arm, Patient Position: Sitting, Cuff Size: Normal)   Pulse 88   Temp 98.4 F (36.9 C) (Oral)   Ht 5\' 6"  (1.676 m)   Wt 184 lb (83.5 kg)   BMI 29.70 kg/m   Wt Readings from Last 3 Encounters:  01/04/18 184 lb (83.5 kg)  12/20/17 184 lb 6.4 oz (83.6 kg)  12/16/17 180 lb (81.6 kg)    Physical Exam  Constitutional: She is oriented to person, place, and time. She appears well-developed and well-nourished. No distress.  HENT:  Head: Normocephalic and atraumatic.  Cardiovascular: Normal  rate, regular rhythm, S1 normal, S2 normal, normal heart sounds and intact distal pulses.  Pulmonary/Chest: Effort normal and breath sounds normal. No respiratory distress.  Abdominal: Soft. Bowel sounds are normal. There is no hepatosplenomegaly. There is tenderness in the right upper quadrant, right lower quadrant and suprapubic area. There is no rigidity, no rebound, no guarding, no CVA tenderness, no tenderness at McBurney's point and negative Murphy's sign. No hernia.  Neurological: She is alert and oriented to person, place, and time.  Skin: Skin is warm. Capillary refill takes less than 2 seconds. She is diaphoretic.  Psychiatric: She has a normal mood and affect. Her speech is normal and behavior is normal. Judgment and thought content normal. Cognition and memory are normal.  Vitals reviewed.   Results for orders placed or performed in visit on 01/04/18  POCT Urinalysis Dipstick  Result Value Ref Range   Color, UA yellow    Clarity, UA clear    Glucose, UA Negative Negative   Bilirubin, UA negative    Ketones, UA negative    Spec Grav, UA 1.015 1.010 - 1.025   Blood, UA moderate    pH, UA 5.0 5.0 - 8.0   Protein, UA Negative Negative   Urobilinogen, UA 0.2 0.2 or 1.0 E.U./dL   Nitrite, UA negative    Leukocytes, UA Negative Negative   Appearance     Odor        Assessment & Plan:   Problem  List Items Addressed This Visit    None    Visit Diagnoses    Flank pain    -  Primary   Relevant Medications   cephALEXin (KEFLEX) 500 MG capsule   tamsulosin (FLOMAX) 0.4 MG CAPS capsule   Other Relevant Orders   POCT Urinalysis Dipstick (Completed)   COMPLETE METABOLIC PANEL WITH GFR   CBC with Differential/Platelet   Urine Culture   Urinalysis, microscopic only   Hematuria, unspecified type       Relevant Medications   cephALEXin (KEFLEX) 500 MG capsule   tamsulosin (FLOMAX) 0.4 MG CAPS capsule   Other Relevant Orders   COMPLETE METABOLIC PANEL WITH GFR   CBC with  Differential/Platelet   Urine Culture   Urinalysis, microscopic only    Acute illness consistent with possible diagnoses of cystitis, nephrolithiasis, pyelonephritis.  Cystitis and pyelonephritis not convincing without presence of leukocytes on UA.  Moderate blood present.  Plan: 1.  Collect labs: CMP, CBC 2.  Collect urine for UA, urine culture, microscopy 3.  Provide empiric coverage for cystitis/pyelonephritis with Keflex 500 mg twice daily x5 days 4.  Instructed patient to contact [facility] urology for follow-up.  Consider additional evaluation for nephrolithiasis. 5.  Follow-up as needed if symptoms do not improve with the next 5 to 7 days.   Meds ordered this encounter  Medications  . cephALEXin (KEFLEX) 500 MG capsule    Sig: Take 1 capsule (500 mg total) by mouth 2 (two) times daily for 5 days.    Dispense:  10 capsule    Refill:  0    Order Specific Question:   Supervising Provider    Answer:   Smitty Cords [[id number]]  . tamsulosin (FLOMAX) 0.4 MG CAPS capsule    Sig: Take 1 capsule (0.4 mg total) by mouth daily after breakfast.    Dispense:  30 capsule    Refill:  0    Order Specific Question:   Supervising Provider    Answer:   Smitty Cords [[id number]]    Follow up plan: Return if symptoms worsen or fail to improve.  Wilhelmina Mcardle, DNP, AGPCNP-BC Adult Gerontology Primary Care Nurse Practitioner [facility] 01/04/2018, 5:51 PM

## 2018-01-05 LAB — URINALYSIS, MICROSCOPIC ONLY
Bacteria, UA: NONE SEEN /HPF
Hyaline Cast: NONE SEEN /LPF
RBC / HPF: NONE SEEN /HPF (ref 0–2)

## 2018-01-05 LAB — URINE CULTURE
MICRO NUMBER:: 90769373
Result:: NO GROWTH
SPECIMEN QUALITY:: ADEQUATE

## 2018-01-06 LAB — TSH: TSH: 0.78 mIU/L

## 2018-01-06 LAB — COMPREHENSIVE METABOLIC PANEL
AG Ratio: 1.9 (calc) (ref 1.0–2.5)
ALT: 8 U/L (ref 6–29)
AST: 13 U/L (ref 10–30)
Albumin: 4.3 g/dL (ref 3.6–5.1)
Alkaline phosphatase (APISO): 83 U/L (ref 33–115)
BUN: 11 mg/dL (ref 7–25)
CO2: 27 mmol/L (ref 20–32)
Calcium: 8.8 mg/dL (ref 8.6–10.2)
Chloride: 104 mmol/L (ref 98–110)
Creat: 0.89 mg/dL (ref 0.50–1.10)
Globulin: 2.3 g/dL (calc) (ref 1.9–3.7)
Glucose, Bld: 101 mg/dL — ABNORMAL HIGH (ref 65–99)
Potassium: 3.8 mmol/L (ref 3.5–5.3)
Sodium: 140 mmol/L (ref 135–146)
Total Bilirubin: 0.6 mg/dL (ref 0.2–1.2)
Total Protein: 6.6 g/dL (ref 6.1–8.1)

## 2018-01-06 LAB — CBC WITH DIFFERENTIAL/PLATELET
Basophils Absolute: 50 cells/uL (ref 0–200)
Basophils Relative: 0.9 %
Eosinophils Absolute: 50 cells/uL (ref 15–500)
Eosinophils Relative: 0.9 %
HCT: 37.4 % (ref 35.0–45.0)
Hemoglobin: 12.4 g/dL (ref 11.7–15.5)
Lymphs Abs: 1546 cells/uL (ref 850–3900)
MCH: 29.2 pg (ref 27.0–33.0)
MCHC: 33.2 g/dL (ref 32.0–36.0)
MCV: 88.2 fL (ref 80.0–100.0)
MPV: 10.3 fL (ref 7.5–12.5)
Monocytes Relative: 3.8 %
Neutro Abs: 3647 cells/uL (ref 1500–7800)
Neutrophils Relative %: 66.3 %
Platelets: 216 10*3/uL (ref 140–400)
RBC: 4.24 10*6/uL (ref 3.80–5.10)
RDW: 12 % (ref 11.0–15.0)
Total Lymphocyte: 28.1 %
WBC mixed population: 209 cells/uL (ref 200–950)
WBC: 5.5 10*3/uL (ref 3.8–10.8)

## 2018-01-06 LAB — HEMOGLOBIN A1C
Hgb A1c MFr Bld: 5.1 % of total Hgb (ref <5.7)
Mean Plasma Glucose: 100 (calc)
eAG (mmol/L): 5.5 (calc)

## 2018-01-06 LAB — LIPID PANEL
Cholesterol: 186 mg/dL (ref <200)
HDL: 42 mg/dL — ABNORMAL LOW (ref >50)
LDL Cholesterol (Calc): 114 mg/dL (calc) — ABNORMAL HIGH
Non-HDL Cholesterol (Calc): 144 mg/dL (calc) — ABNORMAL HIGH (ref <130)
Total CHOL/HDL Ratio: 4.4 (calc) (ref <5.0)
Triglycerides: 188 mg/dL — ABNORMAL HIGH (ref <150)

## 2018-01-08 ENCOUNTER — Other Ambulatory Visit: Payer: Self-pay | Admitting: Nurse Practitioner

## 2018-01-08 DIAGNOSIS — G4762 Sleep related leg cramps: Secondary | ICD-10-CM

## 2018-01-12 ENCOUNTER — Other Ambulatory Visit: Payer: Self-pay | Admitting: Nurse Practitioner

## 2018-01-12 DIAGNOSIS — B001 Herpesviral vesicular dermatitis: Secondary | ICD-10-CM

## 2018-01-17 ENCOUNTER — Telehealth: Payer: Self-pay | Admitting: Nurse Practitioner

## 2018-01-17 DIAGNOSIS — B373 Candidiasis of vulva and vagina: Secondary | ICD-10-CM

## 2018-01-17 DIAGNOSIS — B3731 Acute candidiasis of vulva and vagina: Secondary | ICD-10-CM

## 2018-01-17 NOTE — Telephone Encounter (Signed)
The antibiotic that pt started taking recently has caused a yeast infection.  Please send something for yeast infection and refills on flexeril to [facility] [place].  Her number is [phone]

## 2018-01-18 MED ORDER — FLUCONAZOLE 150 MG PO TABS: 150.0000 mg | ORAL_TABLET | Freq: Once | ORAL | 0 refills | Status: AC

## 2018-01-18 NOTE — Telephone Encounter (Signed)
Discussed with patient in past for antibiotic use and vaginal candidiasis.   Rx sent for diflucan 150 mg tablet.  Take one tablet for one dose.

## 2018-01-18 NOTE — Telephone Encounter (Signed)
The pt was notified. No questions or concern. 

## 2018-01-25 ENCOUNTER — Telehealth: Payer: Self-pay | Admitting: Family Medicine

## 2018-01-25 DIAGNOSIS — T3695XA Adverse effect of unspecified systemic antibiotic, initial encounter: Principal | ICD-10-CM

## 2018-01-25 DIAGNOSIS — B379 Candidiasis, unspecified: Secondary | ICD-10-CM

## 2018-01-25 MED ORDER — FLUCONAZOLE 150 MG PO TABS: ORAL_TABLET | ORAL | 0 refills | Status: DC

## 2018-01-25 NOTE — Telephone Encounter (Signed)
Pt was in for yeast infection and she is still having a discharge.  The call back number is [phone]

## 2018-01-25 NOTE — Telephone Encounter (Signed)
Covering inbox for Wilhelmina McardleLauren Kennedy, AGPCNP-BC while she is out of office.  Reviewed chart, recent UTI treated, 7/10 diflucan 150mg  x 1 dose sent to pharmacy, she has reported often takes 2nd dose. Will send repeat diflucan with 2 pills for repeat course for now. Follow-up if not improving.  Saralyn PilarAlexander Karamalegos, DO [facility] 01/25/2018, 4:58 PM

## 2018-01-25 NOTE — Telephone Encounter (Signed)
I called and spoke w/ the pt and she informed me that the diflucan that was sent over didn't take care of her yeast infection. She states that it normally takes two rounds before it clears it up. Shes requesting that we send another diflucan to her pharmacy. Please advise

## 2018-01-26 NOTE — Telephone Encounter (Addendum)
The pt was called to notify her that the prescription was sent over to her pharmacy. No answer. LMOM that prescription was sent over.

## 2018-01-29 ENCOUNTER — Other Ambulatory Visit: Payer: Self-pay | Admitting: Nurse Practitioner

## 2018-01-29 DIAGNOSIS — R319 Hematuria, unspecified: Secondary | ICD-10-CM

## 2018-01-29 DIAGNOSIS — R109 Unspecified abdominal pain: Secondary | ICD-10-CM

## 2018-04-03 ENCOUNTER — Ambulatory Visit (INDEPENDENT_AMBULATORY_CARE_PROVIDER_SITE_OTHER): Payer: BLUE CROSS/BLUE SHIELD | Admitting: Nurse Practitioner

## 2018-04-03 ENCOUNTER — Encounter: Payer: Self-pay | Admitting: Nurse Practitioner

## 2018-04-03 ENCOUNTER — Other Ambulatory Visit: Payer: Self-pay

## 2018-04-03 VITALS — BP 111/62 | HR 76 | Temp 98.6°F | Ht 66.0 in | Wt 183.6 lb

## 2018-04-03 DIAGNOSIS — M5441 Lumbago with sciatica, right side: Secondary | ICD-10-CM | POA: Diagnosis not present

## 2018-04-03 MED ORDER — TIZANIDINE HCL 4 MG PO TABS: 4.0000 mg | ORAL_TABLET | Freq: Three times a day (TID) | ORAL | 2 refills | Status: DC | PRN

## 2018-04-03 NOTE — Progress Notes (Signed)
Subjective:    Patient ID: Katelyn Holland, female    DOB: Nov 07, 1975, 42 y.o.   MRN: [id number]  Katelyn Holland is a 42 y.o. female presenting on 04/03/2018 for Back Pain (lower back pain that radiates down the Right buttock, possible contributed by work.  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  Patient cut and pulled 650 cables on Friday, which was first day in new role.  2 in diameter 6-8 feet long about 20 lbs.  Patient is walking straight while pulling cable with left hand.  Patient's new role requires using a machine to cut and pull these to next station down a line.  Started as R lower back pain and now has radiating pain into bottom with sharp pain.   - Pulled 600 cables yesterday. - Patient has had two prior lumbar spine surgeries.  Patient is unsure of what initial procedure did.  Knows 2nd procedure was likely a discectomy to fix bulging disc.   Patient continues having left ankle numbness despite this procedure. - For current pain, patient has taken flexeril at night, ibuprofen without relief.  Social History   Tobacco Use  . Smoking status: Never Smoker  . Smokeless tobacco: Never Used  Substance Use Topics  . Alcohol use: Yes    Alcohol/week: 0.0 - 1.0 standard drinks    Comment: occasional 1 every couple of months  . Drug use: No    Review of Systems Per HPI unless specifically indicated above     Objective:    BP 111/62 (BP Location: Right Arm, Patient Position: Sitting, Cuff Size: Normal)   Pulse 76   Temp 98.6 F (37 C) (Oral)   Ht 5\' 6"  (1.676 m)   Wt 183 lb 9.6 oz (83.3 kg)   BMI 29.63 kg/m   Wt Readings from Last 3 Encounters:  04/03/18 183 lb 9.6 oz (83.3 kg)  01/04/18 184 lb (83.5 kg)  12/20/17 184 lb 6.4 oz (83.6 kg)    Physical Exam  Constitutional: She is oriented to person, place, and time. She appears well-developed and well-nourished. No distress.    HENT:  Head: Normocephalic and atraumatic.  Cardiovascular: Normal rate, regular rhythm, S1 normal, S2 normal, normal heart sounds and intact distal pulses.  Pulmonary/Chest: Effort normal and breath sounds normal. No respiratory distress.  Musculoskeletal:  Low Back Inspection: Normal appearance, Large body habitus, no spinal deformity, symmetrical. Palpation: No tenderness over spinous processes. Right sided lumbar paraspinal muscles tender and with hypertonicity/spasm from T9-S1. ROM: Limited active ROM forward flex / back extension, rotation L/R with discomfort.  More discomfort occurs with forward flex. Special Testing: Seated SLR negative for radicular pain bilaterally and without reproduced localized back pain.  Strength: Bilateral hip flex/ext 5/5, knee flex/ext 5/5, ankle dorsiflex/plantarflex 5/5 Neurovascular: intact distal sensation to light touch   Neurological: She is alert and oriented to person, place, and time.  Skin: Skin is warm and dry. Capillary refill takes less than 2 seconds.  Psychiatric: She has a normal mood and affect. Her behavior is normal. Judgment and thought content normal.  Vitals reviewed.    Results for orders placed or performed in visit on 01/04/18  Urine Culture  Result Value Ref Range   MICRO NUMBER: [id number]    SPECIMEN QUALITY: ADEQUATE    Sample Source URINE    STATUS: FINAL  Result: No Growth   Urinalysis, microscopic only  Result Value Ref Range   WBC, UA 0-5 0 - 5 /HPF   RBC / HPF NONE SEEN 0 - 2 /HPF   Squamous Epithelial / LPF 6-10 (A) < OR = 5 /HPF   Bacteria, UA NONE SEEN NONE SEEN /HPF   Hyaline Cast NONE SEEN NONE SEEN /LPF  POCT Urinalysis Dipstick  Result Value Ref Range   Color, UA yellow    Clarity, UA clear    Glucose, UA Negative Negative   Bilirubin, UA negative    Ketones, UA negative    Spec Grav, UA 1.015 1.010 - 1.025   Blood, UA moderate    pH, UA 5.0 5.0 - 8.0   Protein, UA Negative Negative    Urobilinogen, UA 0.2 0.2 or 1.0 E.U./dL   Nitrite, UA negative    Leukocytes, UA Negative Negative   Appearance     Odor        Assessment & Plan:   Problem List Items Addressed This Visit    None    Visit Diagnoses    Acute right-sided low back pain with right-sided sciatica    -  Primary   Relevant Medications   tiZANidine (ZANAFLEX) 4 MG tablet    Pain likely self-limited.  Muscle strain possible complicated by repetitive use/overuse injury.  Plan:  1. Treat with OTC pain meds (acetaminophen and ibuprofen).  Discussed alternate dosing and max dosing. 2. Apply heat and/or ice to affected area. 3. May also apply a muscle rub with lidocaine or lidocaine patch after heat or ice. 4. Take muscle relaxer tizanidine 4 mg up to three times daily.  Cautioned drowsiness.  STOP Flexeril while taking tizanidine. 5. Follow up 4-6 weeks    Meds ordered this encounter  Medications  . tiZANidine (ZANAFLEX) 4 MG tablet    Sig: Take 1 tablet (4 mg total) by mouth every 8 (eight) hours as needed for muscle spasms.    Dispense:  60 tablet    Refill:  2    Order Specific Question:   Supervising Provider    Answer:   Smitty Cords [[id number]]    Follow up plan: Return 4-6 weeks if symptoms worsen or fail to improve.  Wilhelmina Mcardle, DNP, AGPCNP-BC Adult Gerontology Primary Care Nurse Practitioner [facility] 04/03/2018, 9:26 AM

## 2018-04-03 NOTE — Patient Instructions (Addendum)
Katelyn Holland,   Thank you for coming in to clinic today.  1. You have a RIGHT lumbar muscle strain. 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  Do not take more than 3,000 mg in 24 hours from all medicines.  May take Aleeve 220-440mg  every 12 hours as needed. May alternate tylenol and Aleeve in same day. - Use heat and ice.  Apply this for 15 minutes at a time 6-8 times per day.   - Muscle rub with lidocaine, lidocaine patch, Biofreeze, or tiger balm for topical pain relief.  Avoid using this with heat and ice to avoid burns. - START muscle relaxer tizanidine 4 mg one tablet up to three times daily.  This can cause drowsiness, so use caution.  It may be best to only take this at night for helping you during sleep.  2. Change your work back to less physically demanding role.  Please schedule a follow-up appointment with Wilhelmina McardleLauren Danecia Underdown, AGNP. Return 4-6 weeks if symptoms worsen or fail to improve.  If you have any other questions or concerns, please feel free to call the clinic or send a message through MyChart. You may also schedule an earlier appointment if necessary.  You will receive a survey after today's visit either digitally by e-mail or paper by [employer] mail. Your experiences and feedback matter to us.  Please respond so we know how we are doing as we provide care for you.  Wilhelmina McardleLauren Daylynn Stumpp, DNP, AGNP-BC Adult Gerontology Nurse Practitioner [facility]   Low Back Pain Exercises See other page with pictures of each exercise.  Start with 1 or 2 of these exercises that you are most comfortable with. Do not do any exercises that cause you significant worsening pain. Some of these may cause some "stretching soreness" but it should go away after you stop the exercise, and get better over time. Gradually increase up to 3-4 exercises as  tolerated.  Standing hamstring stretch: Place the heel of your leg on a stool about 15 inches high. Keep your knee straight. Lean forward, bending at the hips until you feel a mild stretch in the back of your thigh. Make sure you do not roll your shoulders and bend at the waist when doing this or you will stretch your lower back instead. Hold the stretch for 15 to 30 seconds. Repeat 3 times. Repeat the same stretch on your other leg.  Cat and camel: Get down on your hands and knees. Let your stomach sag, allowing your back to curve downward. Hold this position for 5 seconds. Then arch your back and hold for 5 seconds. Do 3 sets of 10.  Quadriped Arm/Leg Raises: Get down on your hands and knees. Tighten your abdominal muscles to stiffen your spine. While keeping your abdominals tight, raise one arm and the opposite leg away from you. Hold this position for 5 seconds. Lower your arm and leg slowly and alternate sides. Do this 10 times on each side.  Pelvic tilt: Lie on your back with your knees bent and your feet flat on the floor. Tighten your abdominal muscles and push your lower back into the floor. Hold this position for 5 seconds, then relax. Do 3 sets of 10.  Partial curl: Lie on your back with your knees bent and your feet flat on the floor. Tighten your stomach  muscles and flatten your back against the floor. Tuck your chin to your chest. With your hands stretched out in front of you, curl your upper body forward until your shoulders clear the floor. Hold this position for 3 seconds. Don't hold your breath. It helps to breathe out as you lift your shoulders up. Relax. Repeat 10 times. Build to 3 sets of 10. To challenge yourself, clasp your hands behind your head and keep your elbows out to the side.  Lower trunk rotation: Lie on your back with your knees bent and your feet flat on the floor. Tighten your abdominal muscles and push your lower back into the floor. Keeping your shoulders down flat,  gently rotate your legs to one side, then the other as far as you can. Repeat 10 to 20 times.  Single knee to chest stretch: Lie on your back with your legs straight out in front of you. Bring one knee up to your chest and grasp the back of your thigh. Pull your knee toward your chest, stretching your buttock muscle. Hold this position for 15 to 30 seconds and return to the starting position. Repeat 3 times on each side.  Double knee to chest: Lie on your back with your knees bent and your feet flat on the floor. Tighten your abdominal muscles and push your lower back into the floor. Pull both knees up to your chest. Hold for 5 seconds and repeat 10 to 20 times.

## 2018-04-09 ENCOUNTER — Telehealth: Payer: Self-pay

## 2018-04-09 DIAGNOSIS — M5441 Lumbago with sciatica, right side: Secondary | ICD-10-CM

## 2018-04-09 MED ORDER — PREDNISONE 20 MG PO TABS: ORAL_TABLET | ORAL | 0 refills | Status: DC

## 2018-04-09 MED ORDER — GABAPENTIN 300 MG PO CAPS: 300.0000 mg | ORAL_CAPSULE | Freq: Every day | ORAL | 1 refills | Status: DC

## 2018-04-09 NOTE — Telephone Encounter (Signed)
Patient called still complining of back pain radiating down leg.  Patient reported that in the past prednisone helped her. She was told to call you back before calling the back doctor.

## 2018-04-09 NOTE — Telephone Encounter (Signed)
Patient called back and has gotten prednisone and is thankful.  However, she said that you told her that if the medication that you gave her was not helping you would call something in for pain.  Please advise

## 2018-04-09 NOTE — Telephone Encounter (Signed)
Also sent gabapentin 300 mg one tab at hs for radicular pain.

## 2018-04-09 NOTE — Telephone Encounter (Signed)
Reviewed note from OV 04/03/2018 for same problem.  Will continue with prednisone as this is appropriate treatment.  Start prednisone taper over 10 days. Take prednisone taper 20 mg tablets Day 1-4 take 3 pills at one time; Day 5-6: Take 2 pills; Day 7-8: Take 1 pills; Day 9-10: Take 1/2 pill; then stop.

## 2018-04-09 NOTE — Telephone Encounter (Signed)
The pt was notified. No questions or concerns. 

## 2018-04-09 NOTE — Telephone Encounter (Signed)
Attempted to contact the pt, no answer. LMOM to return my call.  

## 2018-04-23 ENCOUNTER — Other Ambulatory Visit: Payer: Self-pay | Admitting: Nurse Practitioner

## 2018-04-23 DIAGNOSIS — B001 Herpesviral vesicular dermatitis: Secondary | ICD-10-CM

## 2018-04-23 NOTE — Telephone Encounter (Signed)
Pt needs refill on valacyclovir sent to [facility] [place]

## 2018-04-25 ENCOUNTER — Other Ambulatory Visit: Payer: Self-pay | Admitting: Nurse Practitioner

## 2018-04-25 DIAGNOSIS — G4762 Sleep related leg cramps: Secondary | ICD-10-CM

## 2018-05-09 ENCOUNTER — Other Ambulatory Visit: Payer: Self-pay | Admitting: Nurse Practitioner

## 2018-05-09 DIAGNOSIS — B001 Herpesviral vesicular dermatitis: Secondary | ICD-10-CM

## 2018-05-10 ENCOUNTER — Telehealth: Payer: Self-pay | Admitting: Nurse Practitioner

## 2018-05-10 DIAGNOSIS — B3731 Acute candidiasis of vulva and vagina: Secondary | ICD-10-CM

## 2018-05-10 DIAGNOSIS — T3695XA Adverse effect of unspecified systemic antibiotic, initial encounter: Secondary | ICD-10-CM

## 2018-05-10 DIAGNOSIS — B379 Candidiasis, unspecified: Secondary | ICD-10-CM

## 2018-05-10 DIAGNOSIS — B373 Candidiasis of vulva and vagina: Secondary | ICD-10-CM

## 2018-05-10 MED ORDER — FLUCONAZOLE 150 MG PO TABS: ORAL_TABLET | ORAL | 0 refills | Status: DC

## 2018-05-10 NOTE — Telephone Encounter (Signed)
Pt called request a refill for a yeast infection  [facility] [place] [phone]

## 2018-05-15 ENCOUNTER — Telehealth: Payer: Self-pay | Admitting: Nurse Practitioner

## 2018-05-15 NOTE — Telephone Encounter (Signed)
Pt called requesting refill on Flexeril called into [facility]   [place]

## 2018-05-15 NOTE — Telephone Encounter (Signed)
Called patient without answer so left message to return call.  I want to ask about why cyclobenzaprine is being requested.  Last change in September was for tizanidine.  I have not heard from patient that tizanidine did or didn't work. Please clarify if patient returns call today.

## 2018-05-17 ENCOUNTER — Encounter: Payer: Self-pay | Admitting: Nurse Practitioner

## 2018-05-29 NOTE — Telephone Encounter (Signed)
Error

## 2018-06-07 ENCOUNTER — Other Ambulatory Visit: Payer: Self-pay | Admitting: Nurse Practitioner

## 2018-06-07 DIAGNOSIS — M5441 Lumbago with sciatica, right side: Secondary | ICD-10-CM

## 2018-06-11 ENCOUNTER — Telehealth: Payer: Self-pay | Admitting: Family Medicine

## 2018-06-11 ENCOUNTER — Other Ambulatory Visit: Payer: Self-pay | Admitting: Nurse Practitioner

## 2018-06-11 DIAGNOSIS — M5441 Lumbago with sciatica, right side: Secondary | ICD-10-CM

## 2018-06-11 NOTE — Telephone Encounter (Signed)
Pt need a refill on tizanidine.

## 2018-06-12 NOTE — Telephone Encounter (Signed)
Rx send on 06/12/2018.

## 2018-06-24 ENCOUNTER — Other Ambulatory Visit: Payer: Self-pay | Admitting: Family Medicine

## 2018-06-24 DIAGNOSIS — B001 Herpesviral vesicular dermatitis: Secondary | ICD-10-CM

## 2018-06-25 MED ORDER — VALACYCLOVIR HCL 500 MG PO TABS: ORAL_TABLET | ORAL | 3 refills | Status: DC

## 2018-07-10 ENCOUNTER — Other Ambulatory Visit: Payer: Self-pay | Admitting: Nurse Practitioner

## 2018-07-10 DIAGNOSIS — B001 Herpesviral vesicular dermatitis: Secondary | ICD-10-CM

## 2018-07-10 DIAGNOSIS — M5441 Lumbago with sciatica, right side: Secondary | ICD-10-CM

## 2018-07-10 MED ORDER — TIZANIDINE HCL 4 MG PO TABS: ORAL_TABLET | ORAL | 2 refills | Status: DC

## 2018-07-10 MED ORDER — VALACYCLOVIR HCL 500 MG PO TABS: ORAL_TABLET | ORAL | 3 refills | Status: AC

## 2018-07-10 NOTE — Telephone Encounter (Signed)
Pt called requesting refill on  Valtrex, Zanaflex called into drug store

## 2018-08-02 ENCOUNTER — Other Ambulatory Visit: Payer: Self-pay | Admitting: Nurse Practitioner

## 2018-08-02 DIAGNOSIS — M5441 Lumbago with sciatica, right side: Secondary | ICD-10-CM

## 2018-12-06 ENCOUNTER — Other Ambulatory Visit: Payer: Self-pay | Admitting: Nurse Practitioner

## 2018-12-06 DIAGNOSIS — M5441 Lumbago with sciatica, right side: Secondary | ICD-10-CM

## 2019-01-13 ENCOUNTER — Other Ambulatory Visit: Payer: Self-pay | Admitting: Family Medicine

## 2019-01-13 DIAGNOSIS — M5441 Lumbago with sciatica, right side: Secondary | ICD-10-CM

## 2020-07-27 ENCOUNTER — Emergency Department
Admission: EM | Admit: 2020-07-27 | Discharge: 2020-07-27 | Disposition: A | Discharge status: Home / Self Care | Payer: PRIVATE HEALTH INSURANCE | Attending: Emergency Medicine | Admitting: Emergency Medicine

## 2020-07-27 ENCOUNTER — Other Ambulatory Visit: Payer: Self-pay

## 2020-07-27 DIAGNOSIS — H60392 Other infective otitis externa, left ear: Secondary | ICD-10-CM | POA: Diagnosis not present

## 2020-07-27 DIAGNOSIS — H9203 Otalgia, bilateral: Secondary | ICD-10-CM | POA: Diagnosis present

## 2020-07-27 DIAGNOSIS — H6983 Other specified disorders of Eustachian tube, bilateral: Secondary | ICD-10-CM | POA: Insufficient documentation

## 2020-07-27 MED ORDER — FLUTICASONE PROPIONATE 50 MCG/ACT NA SUSP: 1.0000 | Freq: Two times a day (BID) | NASAL | 0 refills | Status: AC

## 2020-07-27 MED ORDER — CETIRIZINE HCL 10 MG PO TABS: 10.0000 mg | ORAL_TABLET | Freq: Every day | ORAL | 0 refills | Status: AC

## 2020-07-27 MED ORDER — CIPROFLOXACIN-DEXAMETHASONE 0.3-0.1 % OT SUSP: 4.0000 [drp] | Freq: Two times a day (BID) | OTIC | 0 refills | Status: DC

## 2020-07-27 MED ORDER — CIPROFLOXACIN-DEXAMETHASONE 0.3-0.1 % OT SUSP
4.0000 [drp] | Freq: Two times a day (BID) | OTIC | Status: DC
  Administered 2020-07-27: 4 [drp] via OTIC
  Filled 2020-07-27: qty 7.5

## 2020-07-27 NOTE — ED Notes (Signed)
Called pharmacy, will tube medication in approx 10 min to station 6.

## 2020-07-27 NOTE — ED Triage Notes (Signed)
Pt complaint of bilateral ear pain starting this morning. States "I don't know if it's wax buildup or if it's from being out in the cold at ballgames all weekend." Denies drainage or fever. Pt states she attempted ear wax removal at home without relief.

## 2020-07-27 NOTE — ED Notes (Signed)
Called pharmacy again, will get medication to tube 6.

## 2020-07-27 NOTE — ED Provider Notes (Signed)
[facility] Emergency Department Provider Note  ____________________________________________  Time seen: Approximately 10:23 PM  I have reviewed the triage vital signs and the nursing notes.   HISTORY  Chief Complaint Ear Pain    HPI Katelyn Holland is a 45 y.o. female who presented to the emergency department bilateral ear pain worse on the left than right.  Patient states that she had been outside over the weekend with varying degrees of temperature and weather.  Patient states that she started having pain bilaterally but it is much worse on the left.  Patient states that it is tender to palpation along the left ear but not on the right.  No fevers or chills.  No nasal congestion or sneezing.  No cough.  Patient with no history of recurrent ear infections.  Patient has taken Motrin which helped somewhat with the pain but did not fully alleviate symptoms.         Past Medical History:  Diagnosis Date  . Arthritis   . Chronic constipation   . Gravida 1 para 1   . Kidney stone   . Kidney stones     Patient Active Problem List   Diagnosis Date Noted  . 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 S/P total abdominal hysterectomy 01/09/2017  . Nocturnal leg cramps 02/12/2016  . Current low back pain (Location of Primary Source of Pain) (Left) 02/11/2016  . Chronic flank pain (Left) 02/11/2016  . Encounter for pain management planning 02/11/2016  . Chronic pain 02/10/2016  . Long term current use of opiate analgesic 02/10/2016  . Long term prescription opiate use 02/10/2016  . Opiate use 02/10/2016  . 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 Hypokalemia 07/09/2015  . Anemia 07/09/2015  . Chronic constipation 07/07/2015  . Family history of heart disease in female family member before age 74 12/24/2014  . Low libido 12/24/2014  . Obesity (BMI 30.0-34.9) 04/10/2014    Past Surgical History:  Procedure Laterality Date  . ABDOMINAL  HYSTERECTOMY    . APPENDECTOMY    . BACK SURGERY     x 2  Initial surgery unknown procedure. 2nd surgery repaired disc and compressed nerve for left leg  . KIDNEY STONE SURGERY Left 2000   surgery through back  . LAPAROSCOPIC GASTRIC SLEEVE RESECTION  2016  . LITHOTRIPSY  2007  . NEPHRECTOMY Left   . NEPHROLITHOTOMY Left 07/23/2015   Procedure: NEPHROLITHOTOMY PERCUTANEOUS;  Surgeon: Vanna Scotland, MD;  Location: [facility] ORS;  Service: Urology;  Laterality: Left;    Prior to Admission medications   Medication Sig Start Date End Date Taking? Authorizing Provider  cetirizine (ZYRTEC) 10 MG tablet Take 1 tablet (10 mg total) by mouth daily. 07/27/20  Yes Matthew Pais, Delorise Royals, PA-C  ciprofloxacin-dexamethasone (CIPRODEX) OTIC suspension Place 4 drops into the left ear 2 (two) times daily. 07/27/20  Yes Addam Goeller, Delorise Royals, PA-C  fluticasone (FLONASE) 50 MCG/ACT nasal spray Place 1 spray into both nostrils 2 (two) times daily. 07/27/20  Yes Anginette Espejo, Delorise Royals, PA-C  diphenhydrAMINE (BENADRYL) 25 mg capsule Take 50 mg by mouth 2 (two) times daily.     [provider]  fluconazole (DIFLUCAN) 150 MG tablet Take one tablet by mouth on Day 1. Repeat dose 2nd tablet on Day 3 if not improved. 05/10/18   Galen Manila, NP  gabapentin (NEURONTIN) 300 MG capsule TAKE 1 CAPSULE(300 MG) BY MOUTH AT BEDTIME 06/11/18   Galen Manila, NP  ondansetron (ZOFRAN) 4 MG tablet Take  4 mg by mouth 2 (two) times daily.    [provider]  predniSONE (DELTASONE) 20 MG tablet Day 1-4 take 3 pills once daily.  Day 5-6 take 2 pills.  Day 7-8 take 1 pill.  Day 9-10 take 1/2 pill. 04/09/18   Galen Manila, NP  tiZANidine (ZANAFLEX) 4 MG tablet TAKE 1 TABLET(4 MG) BY MOUTH EVERY 8 HOURS AS NEEDED FOR MUSCLE SPASMS 01/14/19   Karamalegos, Netta Neat, DO  valACYclovir (VALTREX) 500 MG tablet TAKE 1 TABLET BY MOUTH ONCE DAILY. FOR FLARE, MAY TAKE 2 TABLETS TWICE DAILY FOR 1 DAY 07/10/18    Galen Manila, NP    Allergies Dilaudid  [hydromorphone hcl], Iodinated diagnostic agents, and Other  Family History  Problem Relation Age of Onset  . Heart disease Mother 14  . Cancer Maternal Grandmother   . Heart disease Father   . Kidney cancer Neg Hx   . Bladder Cancer Neg Hx   . Prostate cancer Neg Hx     Social History Social History   Tobacco Use  . Smoking status: Never Smoker  . Smokeless tobacco: Never Used  Substance Use Topics  . Alcohol use: Yes    Alcohol/week: 0.0 - 1.0 standard drinks    Comment: occasional 1 every couple of months  . Drug use: No     Review of Systems  Constitutional: No fever/chills Eyes: No visual changes. No discharge ENT: Bilateral ear pain worse on the left than right. Cardiovascular: no chest pain. Respiratory: no cough. No SOB. Gastrointestinal: No abdominal pain.  No nausea, no vomiting.  No diarrhea.  No constipation. Musculoskeletal: Negative for musculoskeletal pain. Skin: Negative for rash, abrasions, lacerations, ecchymosis. Neurological: Negative for headaches, focal weakness or numbness.  10 System ROS otherwise negative.  ____________________________________________   PHYSICAL EXAM:  VITAL SIGNS: ED Triage Vitals  Enc Vitals Group     BP 07/27/20 1943 126/78     Pulse Rate 07/27/20 1943 66     Resp 07/27/20 1943 16     Temp 07/27/20 1943 98.3 F (36.8 C)     Temp Source 07/27/20 1943 Oral     SpO2 07/27/20 1943 98 %     Weight 07/27/20 1944 170 lb (77.1 kg)     Height 07/27/20 1944 5\' 5"  (1.651 m)     Head Circumference --      Peak Flow --      Pain Score 07/27/20 2010 6     Pain Loc --      Pain Edu? --      Excl. in GC? --      Constitutional: Alert and oriented. Well appearing and in no acute distress. Eyes: Conjunctivae are normal. PERRL. EOMI. Head: Atraumatic. ENT:      Ears: Visualization of bilateral ears reveals no erythema or edema along the EAC on the right side.  Mild  bulging on the right with no air-fluid level and no injection.  Visualization of the left ear revealed tenderness over the tragus with no external ear findings.  Patient had erythema and slight edema of the EAC on the left when compared with right.  Slight bulging TM with no injection or air-fluid level.      Nose: No congestion/rhinnorhea.      Mouth/Throat: Mucous membranes are moist.  Neck: No stridor.    Cardiovascular: Normal rate, regular rhythm. Normal S1 and S2.  Good peripheral circulation. Respiratory: Normal respiratory effort without tachypnea or retractions. Lungs CTAB. Good air  entry to the bases with no decreased or absent breath sounds. Musculoskeletal: Full range of motion to all extremities. No gross deformities appreciated. Neurologic:  Normal speech and language. No gross focal neurologic deficits are appreciated.  Skin:  Skin is warm, dry and intact. No rash noted. Psychiatric: Mood and affect are normal. Speech and behavior are normal. Patient exhibits appropriate insight and judgement.   ____________________________________________   LABS (all labs ordered are listed, but only abnormal results are displayed)  Labs Reviewed - No data to display ____________________________________________  EKG   ____________________________________________  RADIOLOGY   No results found.  ____________________________________________    PROCEDURES  Procedure(s) performed:    Procedures    Medications  ciprofloxacin-dexamethasone (CIPRODEX) 0.3-0.1 % OTIC (EAR) suspension 4 drop (has no administration in time range)     ____________________________________________   INITIAL IMPRESSION / ASSESSMENT AND PLAN / ED COURSE  Pertinent labs & imaging results that were available during my care of the patient were reviewed by me and considered in my medical decision making (see chart for details).  Review of the [place] CSRS was performed in accordance of the [facility] prior to  dispensing any controlled drugs.           Patient's diagnosis is consistent with eustachian tube dysfunction, otitis externa of the left ear.  Patient presented to the emergency department bilateral ear pain much worse on the left than right.  Findings on physical exam revealed eustachian tube dysfunction bilaterally with minimal bulging of the TMs with no injection.  Patient had findings consistent with otitis externa on the left and will be placed on Ciprodex and Flonase and Zyrtec at home.  First dose of antibiotic eardrops provided here in the emergency department.  Follow-up primary care as needed..Patient is given ED precautions to return to the ED for any worsening or new symptoms.     ____________________________________________  FINAL CLINICAL IMPRESSION(S) / ED DIAGNOSES  Final diagnoses:  Other infective acute otitis externa of left ear  Eustachian tube dysfunction, bilateral      NEW MEDICATIONS STARTED DURING THIS VISIT:  ED Discharge Orders         Ordered    fluticasone (FLONASE) 50 MCG/ACT nasal spray  2 times daily        07/27/20 2215    cetirizine (ZYRTEC) 10 MG tablet  Daily        07/27/20 2215    ciprofloxacin-dexamethasone (CIPRODEX) OTIC suspension  2 times daily        07/27/20 2215              This chart was dictated using voice recognition software/Dragon. Despite best efforts to proofread, errors can occur which can change the meaning. Any change was purely unintentional.    Racheal Patches, PA-C 07/27/20 2230    Delton Prairie, MD 07/27/20 [phone]

## 2021-04-16 ENCOUNTER — Emergency Department
Admission: EM | Admit: 2021-04-16 | Discharge: 2021-04-16 | Disposition: A | Discharge status: Home / Self Care | Payer: PRIVATE HEALTH INSURANCE | Attending: Emergency Medicine | Admitting: Emergency Medicine

## 2021-04-16 ENCOUNTER — Other Ambulatory Visit: Payer: Self-pay

## 2021-04-16 DIAGNOSIS — Z79899 Other long term (current) drug therapy: Secondary | ICD-10-CM | POA: Insufficient documentation

## 2021-04-16 DIAGNOSIS — F419 Anxiety disorder, unspecified: Secondary | ICD-10-CM | POA: Insufficient documentation

## 2021-04-16 DIAGNOSIS — F1290 Cannabis use, unspecified, uncomplicated: Secondary | ICD-10-CM | POA: Diagnosis present

## 2021-04-16 LAB — URINE DRUG SCREEN, QUALITATIVE (ARMC ONLY)
Amphetamines, Ur Screen: NOT DETECTED
Barbiturates, Ur Screen: NOT DETECTED
Benzodiazepine, Ur Scrn: NOT DETECTED
Cannabinoid 50 Ng, Ur ~~LOC~~: POSITIVE — AB
Cocaine Metabolite,Ur ~~LOC~~: NOT DETECTED
MDMA (Ecstasy)Ur Screen: NOT DETECTED
Methadone Scn, Ur: NOT DETECTED
Opiate, Ur Screen: NOT DETECTED
Phencyclidine (PCP) Ur S: NOT DETECTED
Tricyclic, Ur Screen: POSITIVE — AB

## 2021-04-16 NOTE — ED Triage Notes (Signed)
Pt took a gummy from someone at work and has been having increased anxiety/panic attack since then. Ingested it about an hour ago.

## 2021-04-16 NOTE — ED Provider Notes (Signed)
[facility] Emergency Department Provider Note   ____________________________________________   I have reviewed the triage vital signs and the nursing notes.   HISTORY  Chief Complaint Anxiety   History limited by: Not Limited   HPI Katelyn Holland is a 45 y.o. female who presents to the emergency department today because of concern for anxiety and not feeling like her self. She states that this sensation came over her roughly 45 minutes after she took a gummy from a coworker. The coworker offered her the gummy because the patient had been having some back pain. She was told the gummy would help with her pain. The patient did not ask what was in the gummy. She denies any recent illness.  Records reviewed. Per medical record review patient has a history of chronic pain.  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   S/P total abdominal hysterectomy 01/09/2017   Nocturnal leg cramps 02/12/2016   Current low back pain (Location of Primary Source of Pain) (Left) 02/11/2016   Chronic flank pain (Left) 02/11/2016   Encounter for pain management planning 02/11/2016   Chronic pain 02/10/2016   Long term current use of opiate analgesic 02/10/2016   Long term prescription opiate use 02/10/2016   Opiate use 02/10/2016   UPJ (ureteropelvic junction) obstruction 07/09/2015   Hypokalemia 07/09/2015   Anemia 07/09/2015   Chronic constipation 07/07/2015   Family history of heart disease in female family member before age 68 12/24/2014   Low libido 12/24/2014   Obesity (BMI 30.0-34.9) 04/10/2014    Past Surgical History:  Procedure Laterality Date   ABDOMINAL HYSTERECTOMY     APPENDECTOMY     BACK SURGERY     x 2  Initial surgery unknown procedure. 2nd surgery repaired disc and  compressed nerve for left leg   KIDNEY STONE SURGERY Left 2000   surgery through back   LAPAROSCOPIC GASTRIC SLEEVE RESECTION  2016   LITHOTRIPSY  2007   NEPHRECTOMY Left    NEPHROLITHOTOMY Left 07/23/2015   Procedure: NEPHROLITHOTOMY PERCUTANEOUS;  Surgeon: Vanna Scotland, MD;  Location: [facility] ORS;  Service: Urology;  Laterality: Left;    Prior to Admission medications   Medication Sig Start Date End Date Taking? Authorizing Provider  cetirizine (ZYRTEC) 10 MG tablet Take 1 tablet (10 mg total) by mouth daily. 07/27/20   Cuthriell, Delorise Royals, PA-C  ciprofloxacin-dexamethasone (CIPRODEX) OTIC suspension Place 4 drops into the left ear 2 (two) times daily. 07/27/20   Cuthriell, Delorise Royals, PA-C  diphenhydrAMINE (BENADRYL) 25 mg capsule Take 50 mg by mouth 2 (two) times daily.     [provider]  fluconazole (DIFLUCAN) 150 MG tablet Take one tablet by mouth on Day 1. Repeat dose 2nd tablet on Day 3 if not improved. 05/10/18   Galen Manila, NP  fluticasone (FLONASE) 50 MCG/ACT nasal spray Place 1 spray into both nostrils 2 (two) times daily. 07/27/20   Cuthriell, Delorise Royals, PA-C  gabapentin (NEURONTIN) 300 MG capsule TAKE 1 CAPSULE(300 MG) BY MOUTH AT BEDTIME 06/11/18   Galen Manila, NP  ondansetron (ZOFRAN) 4 MG tablet Take 4 mg by mouth 2 (two) times daily.    [provider]  predniSONE (DELTASONE) 20 MG tablet Day 1-4 take  3 pills once daily.  Day 5-6 take 2 pills.  Day 7-8 take 1 pill.  Day 9-10 take 1/2 pill. 04/09/18   Galen Manila, NP  tiZANidine (ZANAFLEX) 4 MG tablet TAKE 1 TABLET(4 MG) BY MOUTH EVERY 8 HOURS AS NEEDED FOR MUSCLE SPASMS 01/14/19   Karamalegos, Netta Neat, DO  valACYclovir (VALTREX) 500 MG tablet TAKE 1 TABLET BY MOUTH ONCE DAILY. FOR FLARE, MAY TAKE 2 TABLETS TWICE DAILY FOR 1 DAY 07/10/18   Galen Manila, NP    Allergies Dilaudid  [hydromorphone hcl], Iodinated diagnostic agents, and Other  Family History  Problem  Relation Age of Onset   Heart disease Mother 63   Cancer Maternal Grandmother    Heart disease Father    Kidney cancer Neg Hx    Bladder Cancer Neg Hx    Prostate cancer Neg Hx     Social History Social History   Tobacco Use   Smoking status: Never   Smokeless tobacco: Never  Substance Use Topics   Alcohol use: Yes    Alcohol/week: 0.0 - 1.0 standard drinks    Comment: occasional 1 every couple of months   Drug use: No    Review of Systems Constitutional: No fever/chills Eyes: No visual changes. ENT: No sore throat. Cardiovascular: Denies chest pain. Respiratory: Denies shortness of breath. Gastrointestinal: No abdominal pain.  No nausea, no vomiting.  No diarrhea.   Genitourinary: Negative for dysuria. Musculoskeletal: Negative for back pain. Skin: Negative for rash. Neurological: Positive for not feeling like herself.   ____________________________________________   PHYSICAL EXAM:  VITAL SIGNS: ED Triage Vitals  Enc Vitals Group     BP 04/16/21 1445 133/90     Pulse Rate 04/16/21 1445 (!) 109     Resp 04/16/21 1445 (!) 28     Temp 04/16/21 1445 (!) 97.5 F (36.4 C)     Temp Source 04/16/21 1445 Oral     SpO2 04/16/21 1445 97 %     Weight 04/16/21 1445 180 lb (81.6 kg)     Height 04/16/21 1445 5\' 5"  (1.651 m)     Head Circumference --      Peak Flow --      Pain Score 04/16/21 1445 0   Constitutional: Alert and oriented.  Eyes: Conjunctivae are normal.  ENT      Head: Normocephalic and atraumatic.      Nose: No congestion/rhinnorhea.      Mouth/Throat: Mucous membranes are moist.      Neck: No stridor. Hematological/Lymphatic/Immunilogical: No cervical lymphadenopathy. Cardiovascular: Normal rate, regular rhythm.  No murmurs, rubs, or gallops.  Respiratory: Normal respiratory effort without tachypnea nor retractions. Breath sounds are clear and equal bilaterally. No wheezes/rales/rhonchi. Gastrointestinal: Soft and non tender. No rebound. No guarding.   Genitourinary: Deferred Musculoskeletal: Normal range of motion in all extremities. No lower extremity edema. Neurologic:  Normal speech and language. No gross focal neurologic deficits are appreciated.  Skin:  Skin is warm, dry and intact. No rash noted. Psychiatric: Anxious, tearful. ____________________________________________    LABS (pertinent positives/negatives)  UDS positive for tricyclic and cannabinoid  ____________________________________________   EKG  I, 06/16/21, attending physician, personally viewed and interpreted this EKG  EKG Time: 1534 Rate: 116 Rhythm: sinus tachycardia Axis: normal Intervals: qtc 447 QRS: narrow ST changes: no st elevation Impression: abnormal ekg ____________________________________________    RADIOLOGY  None  ____________________________________________   PROCEDURES  Procedures  ____________________________________________   INITIAL IMPRESSION / ASSESSMENT AND PLAN / ED COURSE  Pertinent labs & imaging results that were available during my care of the patient were reviewed by me and considered in my medical decision making (see chart for details).   Patient presented to the emergency department today with concerns for anxiety after taking a gummy from a coworker.  While patient did not know what the gummy contained her urine drug screen came back positive for marijuana.  She was watched here in the emergency department for few hours and did have significant improvement of her symptoms. ____________________________________________   FINAL CLINICAL IMPRESSION(S) / ED DIAGNOSES  Final diagnoses:  Use of cannabinoid edibles     Note: This dictation was prepared with Dragon dictation. Any transcriptional errors that result from this process are unintentional     Phineas Semen, MD 04/16/21 1759

## 2021-04-16 NOTE — ED Notes (Signed)
Prior to discharge, patient stated that she would need to call her husband for transport home. This Clinical research associate questioned the patient about whether she felt safe at home with her husband. The patient replied that she did feel safe.

## 2021-04-16 NOTE — Discharge Instructions (Addendum)
Please seek medical attention for any high fevers, chest pain, shortness of breath, change in behavior, persistent vomiting, bloody stool or any other new or concerning symptoms.  

## 2021-10-11 ENCOUNTER — Emergency Department
Admission: EM | Admit: 2021-10-11 | Discharge: 2021-10-11 | Disposition: A | Discharge status: Home / Self Care | Payer: PRIVATE HEALTH INSURANCE

## 2022-05-11 ENCOUNTER — Ambulatory Visit
Admission: EM | Admit: 2022-05-11 | Discharge: 2022-05-11 | Disposition: A | Discharge status: Home / Self Care | Payer: PRIVATE HEALTH INSURANCE | Attending: Physician Assistant | Admitting: Physician Assistant

## 2022-05-11 ENCOUNTER — Encounter: Payer: Self-pay | Admitting: Emergency Medicine

## 2022-05-11 DIAGNOSIS — B3731 Acute candidiasis of vulva and vagina: Secondary | ICD-10-CM

## 2022-05-11 DIAGNOSIS — Z113 Encounter for screening for infections with a predominantly sexual mode of transmission: Secondary | ICD-10-CM | POA: Diagnosis present

## 2022-05-11 DIAGNOSIS — N760 Acute vaginitis: Secondary | ICD-10-CM

## 2022-05-11 DIAGNOSIS — B9689 Other specified bacterial agents as the cause of diseases classified elsewhere: Secondary | ICD-10-CM

## 2022-05-11 DIAGNOSIS — A599 Trichomoniasis, unspecified: Secondary | ICD-10-CM | POA: Diagnosis present

## 2022-05-11 LAB — WET PREP, GENITAL
Sperm: NONE SEEN
WBC, Wet Prep HPF POC: >10 — AB (ref <10)

## 2022-05-11 MED ORDER — METRONIDAZOLE 500 MG PO TABS: 500.0000 mg | ORAL_TABLET | Freq: Two times a day (BID) | ORAL | 0 refills | Status: AC

## 2022-05-11 MED ORDER — FLUCONAZOLE 150 MG PO TABS: ORAL_TABLET | ORAL | 1 refills | Status: DC

## 2022-05-11 NOTE — ED Triage Notes (Signed)
Pt c/o vaginal discharge. She states the discharge is from a white to greenish color. Started  a couple of weeks ago. She states she was on antibiotics for a UTI and started 3 days after she stated the abx. Pt states she tried otc remedies without relief.

## 2022-05-11 NOTE — ED Provider Notes (Signed)
[facility]    CSN: [id number] Arrival date & time: 05/11/22  1608      History   Chief Complaint Chief Complaint  Patient presents with   Vaginal Discharge    HPI Katelyn Holland is a 46 y.o. female presenting for vaginal discharge which is thin and greenish in color for the past couple weeks.  Patient reports that she took Bactrim for a UTI and then after about 3 days of finishing the medication she started to develop a vaginal discharge.  She has treated herself with over-the-counter Monistat which did not seem to get rid of the discharge.  She is not complaining of any continued dysuria, frequency or urgency.  No abdominal or pelvic pain.  No report of any concern for STIs.  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  No other complaints.  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   S/P total abdominal hysterectomy 01/09/2017   Nocturnal leg cramps 02/12/2016   Current low back pain (Location of Primary Source of Pain) (Left) 02/11/2016   Chronic flank pain (Left) 02/11/2016   Encounter for pain management planning 02/11/2016   Chronic pain 02/10/2016   Long term current use of opiate analgesic 02/10/2016   Long term prescription opiate use 02/10/2016   Opiate use 02/10/2016   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 2nd  surgery repaired disc and compressed nerve for left leg   KIDNEY STONE SURGERY Left 2000   surgery through back   LAPAROSCOPIC GASTRIC SLEEVE RESECTION  2016   LITHOTRIPSY  2007   NEPHRECTOMY Left    NEPHROLITHOTOMY Left 07/23/2015   Procedure: NEPHROLITHOTOMY PERCUTANEOUS;  Surgeon: Vanna Scotland, MD;  Location: [facility];  Service: Urology;  Laterality: Left;    OB History   No obstetric history on file.      Home Medications    Prior to Admission medications   Medication Sig Start Date End Date Taking? Authorizing Provider  metroNIDAZOLE (FLAGYL) 500 MG tablet Take 1 tablet (500 mg total) by mouth 2 (two) times daily for 7 days. 05/11/22 05/18/22 Yes Shirlee Latch, PA-C  cetirizine (ZYRTEC) 10 MG tablet Take 1 tablet (10 mg total) by mouth daily. 07/27/20   Cuthriell, Delorise Royals, PA-C  ciprofloxacin-dexamethasone (CIPRODEX) OTIC suspension Place 4 drops into the left ear 2 (two) times daily. 07/27/20   Cuthriell, Delorise Royals, PA-C  diphenhydrAMINE (BENADRYL) 25 mg capsule Take 50 mg by mouth 2 (two) times daily.     [provider]  fluconazole (DIFLUCAN) 150 MG tablet Take one tablet by mouth on Day 1. Repeat dose 2nd tablet on Day 3 if not improved. 05/11/22   Eusebio Friendly B, PA-C  fluticasone (FLONASE) 50 MCG/ACT nasal spray  Place 1 spray into both nostrils 2 (two) times daily. 07/27/20   Cuthriell, Delorise Royals, PA-C  gabapentin (NEURONTIN) 300 MG capsule TAKE 1 CAPSULE(300 MG) BY MOUTH AT BEDTIME 06/11/18   Galen Manila, NP  ondansetron (ZOFRAN) 4 MG tablet Take 4 mg by mouth 2 (two) times daily.    [provider]  predniSONE (DELTASONE) 20 MG tablet Day 1-4 take 3 pills once daily.  Day 5-6 take 2 pills.  Day 7-8 take 1 pill.  Day 9-10 take 1/2 pill. 04/09/18   Galen Manila, NP  tiZANidine (ZANAFLEX) 4 MG tablet TAKE 1 TABLET(4 MG) BY MOUTH EVERY 8 HOURS AS NEEDED FOR MUSCLE SPASMS 01/14/19   Karamalegos, Netta Neat, DO  valACYclovir (VALTREX) 500 MG  tablet TAKE 1 TABLET BY MOUTH ONCE DAILY. FOR FLARE, MAY TAKE 2 TABLETS TWICE DAILY FOR 1 DAY 07/10/18   Galen Manila, NP    Family History Family History  Problem Relation Age of Onset   Heart disease Mother 38   Cancer Maternal Grandmother    Heart disease Father    Kidney cancer Neg Hx    Bladder Cancer Neg Hx    Prostate cancer Neg Hx     Social History Social History   Tobacco Use   Smoking status: Never   Smokeless tobacco: Never  Vaping Use   Vaping Use: Never used  Substance Use Topics   Alcohol use: Yes    Alcohol/week: 0.0 - 1.0 standard drinks of alcohol    Comment: occasional 1 every couple of months   Drug use: No     Allergies   Dilaudid  [hydromorphone hcl], Iodinated contrast media, and Other   Review of Systems Review of Systems  Constitutional:  Negative for fatigue and fever.  HENT:  Negative for mouth sores.   Gastrointestinal:  Negative for abdominal pain.  Genitourinary:  Positive for vaginal discharge. Negative for dysuria, flank pain, frequency, hematuria, urgency, vaginal bleeding and vaginal pain.  Musculoskeletal:  Negative for back pain.  Skin:  Negative for rash.     Physical Exam Triage Vital Signs ED Triage Vitals  Enc Vitals Group     BP 05/11/22 1720 121/79     Pulse Rate 05/11/22 1720 60     Resp 05/11/22 1720 16     Temp 05/11/22 1720 98.3 F (36.8 C)     Temp Source 05/11/22 1720 Oral     SpO2 05/11/22 1720 97 %     Weight 05/11/22 1718 179 lb 14.3 oz (81.6 kg)     Height 05/11/22 1718 5\' 5"  (1.651 m)     Head Circumference --      Peak Flow --      Pain Score 05/11/22 1717 0     Pain Loc --      Pain Edu? --      Excl. in GC? --    No data found.  Updated Vital Signs BP 121/79 (BP Location: Left Arm)   Pulse 60   Temp 98.3 F (36.8 C) (Oral)   Resp 16   Ht 5\' 5"  (1.651 m)   Wt 179 lb 14.3 oz (81.6 kg)   SpO2 97%   BMI 29.94 kg/m      Physical Exam Vitals and nursing note reviewed.   Constitutional:      General: She is not in acute distress.    Appearance: Normal appearance. She is not ill-appearing or toxic-appearing.  HENT:     Head: Normocephalic  and atraumatic.  Eyes:     General: No scleral icterus.       Right eye: No discharge.        Left eye: No discharge.     Conjunctiva/sclera: Conjunctivae normal.  Cardiovascular:     Rate and Rhythm: Normal rate and regular rhythm.     Heart sounds: Normal heart sounds.  Pulmonary:     Effort: Pulmonary effort is normal. No respiratory distress.     Breath sounds: Normal breath sounds.  Abdominal:     Palpations: Abdomen is soft.     Tenderness: There is no abdominal tenderness. There is no right CVA tenderness.  Musculoskeletal:     Cervical back: Neck supple.  Skin:    General: Skin is dry.  Neurological:     General: No focal deficit present.     Mental Status: She is alert. Mental status is at baseline.     Motor: No weakness.     Gait: Gait normal.  Psychiatric:        Mood and Affect: Mood normal.        Behavior: Behavior normal.        Thought Content: Thought content normal.      UC Treatments / Results  Labs (all labs ordered are listed, but only abnormal results are displayed) Labs Reviewed  WET PREP, GENITAL - Abnormal; Notable for the following components:      Result Value   Yeast Wet Prep HPF POC PRESENT (*)    Trich, Wet Prep PRESENT (*)    Clue Cells Wet Prep HPF POC PRESENT (*)    WBC, Wet Prep HPF POC >10 (*)    All other components within normal limits  CERVICOVAGINAL ANCILLARY ONLY    EKG   Radiology No results found.  Procedures Procedures (including critical care time)  Medications Ordered in UC Medications - No data to display  Initial Impression / Assessment and Plan / UC Course  I have reviewed the triage vital signs and the nursing notes.  Pertinent labs & imaging results that were available during my care of the patient were reviewed by me and considered  in my medical decision making (see chart for details).   46 year old female presenting for thin greenish vaginal discharge for the past couple of weeks.  No report of any noticeable odor.  No vaginal itching.  Symptoms started about 3 days after she took antibiotics for a UTI.  Vitals are stable.  She is overall well-appearing.  On exam she has no abdominal tenderness or CVA tenderness.  Patient performs vaginal self swab and is positive for yeast, trichomoniasis and clue cells.  Discussed with her that trichomonas is a sexually transmitted infection.  She reports that she only has 1 sexual partner and as far she knew he only had 1 sexual partner.  Advised her that we should also test her for gonorrhea and chlamydia.  She is agreeable.  We will treat for those conditions if positive.  At this time treating with metronidazole and Diflucan.  Plenty of rest and fluids.  Advised her to speak with her partner about the test results.  Follow-up as needed.   Final Clinical Impressions(s) / UC Diagnoses   Final diagnoses:  Yeast vaginitis  BV (bacterial vaginosis)  Trichomonas infection  Routine screening for STI (sexually transmitted infection)     Discharge Instructions      -You have bacterial vaginosis, yeast infection and trichomonas.  Trichomonas is a sexually transmitted infection.  We are also testing for gonorrhea and chlamydia.  Those results to be back in the next 24 hours and we will call you with any positives and recommend further treatment.  You need to speak with your partner because they need to be treated for this trichomonas infection.  No sexual contact until 1 week after both partners have completed treatment.    The most common types of vaginal infections are yeast infections and bacterial vaginosis. Neither of which are really considered to be sexually transmitted. Often a pH swab or wet prep is performed and if abnormal may reveal either type of infection. Begin metronidazole  if prescribed for possible BV infection. If there is concern for yeast infection, fluconazole is often prescribed . Take this as directed. You may also apply topical miconazole (can be purchased OTC) externally for relief of itching. Increase rest and fluid intake. If labs sent out, we will call within 2-5 days with results and amend treatment if necessary. Always try to use pH balanced washes/wipes, urinate after intercourse, stay hydrated, and take probiotics if you are prone to vaginal infections. Return or see PCP or gynecologist for new/worsening infections.       ED Prescriptions     Medication Sig Dispense Auth. Provider   fluconazole (DIFLUCAN) 150 MG tablet Take one tablet by mouth on Day 1. Repeat dose 2nd tablet on Day 3 if not improved. 2 tablet Eusebio Friendly B, PA-C   metroNIDAZOLE (FLAGYL) 500 MG tablet Take 1 tablet (500 mg total) by mouth 2 (two) times daily for 7 days. 14 tablet Gareth Morgan      PDMP not reviewed this encounter.   Shirlee Latch, PA-C 05/11/22 1757

## 2022-05-11 NOTE — Discharge Instructions (Addendum)
-  You have bacterial vaginosis, yeast infection and trichomonas.  Trichomonas is a sexually transmitted infection.  We are also testing for gonorrhea and chlamydia.  Those results to be back in the next 24 hours and we will call you with any positives and recommend further treatment.  You need to speak with your partner because they need to be treated for this trichomonas infection.  No sexual contact until 1 week after both partners have completed treatment.    The most common types of vaginal infections are yeast infections and bacterial vaginosis. Neither of which are really considered to be sexually transmitted. Often a pH swab or wet prep is performed and if abnormal may reveal either type of infection. Begin metronidazole if prescribed for possible BV infection. If there is concern for yeast infection, fluconazole is often prescribed . Take this as directed. You may also apply topical miconazole (can be purchased OTC) externally for relief of itching. Increase rest and fluid intake. If labs sent out, we will call within 2-5 days with results and amend treatment if necessary. Always try to use pH balanced washes/wipes, urinate after intercourse, stay hydrated, and take probiotics if you are prone to vaginal infections. Return or see PCP or gynecologist for new/worsening infections.

## 2022-05-12 LAB — CERVICOVAGINAL ANCILLARY ONLY
Chlamydia: NEGATIVE
Comment: NEGATIVE
Comment: NORMAL
Neisseria Gonorrhea: NEGATIVE

## 2022-12-09 ENCOUNTER — Other Ambulatory Visit: Payer: Self-pay

## 2022-12-09 ENCOUNTER — Emergency Department
Admission: EM | Admit: 2022-12-09 | Discharge: 2022-12-09 | Disposition: A | Discharge status: Home / Self Care | Payer: Worker's Compensation | Attending: Student in an Organized Health Care Education/Training Program | Admitting: Student in an Organized Health Care Education/Training Program

## 2022-12-09 ENCOUNTER — Emergency Department: Payer: Worker's Compensation

## 2022-12-09 DIAGNOSIS — W208XXA Other cause of strike by thrown, projected or falling object, initial encounter: Secondary | ICD-10-CM | POA: Insufficient documentation

## 2022-12-09 DIAGNOSIS — S0990XA Unspecified injury of head, initial encounter: Secondary | ICD-10-CM | POA: Diagnosis present

## 2022-12-09 DIAGNOSIS — S060X0A Concussion without loss of consciousness, initial encounter: Secondary | ICD-10-CM | POA: Insufficient documentation

## 2022-12-09 DIAGNOSIS — Y990 Civilian activity done for income or pay: Secondary | ICD-10-CM | POA: Diagnosis not present

## 2022-12-09 DIAGNOSIS — J3489 Other specified disorders of nose and nasal sinuses: Secondary | ICD-10-CM | POA: Diagnosis not present

## 2022-12-09 DIAGNOSIS — S0083XA Contusion of other part of head, initial encounter: Secondary | ICD-10-CM | POA: Diagnosis not present

## 2022-12-09 NOTE — Discharge Instructions (Signed)
Please apply ice to the bridge of the nose 20 minutes every hour for the next 2 days as needed for pain/swelling.  Alternate Tylenol and ibuprofen taking by medications every 6 hours as needed.  Return to the ER for any severe headaches, vomiting, vision changes, worsening symptoms or any urgent changes in your health
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
## 2023-01-22 ENCOUNTER — Encounter: Payer: Self-pay | Admitting: Emergency Medicine

## 2023-01-22 ENCOUNTER — Ambulatory Visit
Admission: EM | Admit: 2023-01-22 | Discharge: 2023-01-22 | Disposition: A | Discharge status: Home / Self Care | Payer: BLUE CROSS/BLUE SHIELD | Attending: Family Medicine | Admitting: Family Medicine

## 2023-01-22 DIAGNOSIS — B3731 Acute candidiasis of vulva and vagina: Secondary | ICD-10-CM

## 2023-01-22 DIAGNOSIS — B9689 Other specified bacterial agents as the cause of diseases classified elsewhere: Secondary | ICD-10-CM

## 2023-01-22 DIAGNOSIS — N898 Other specified noninflammatory disorders of vagina: Secondary | ICD-10-CM

## 2023-01-22 DIAGNOSIS — N760 Acute vaginitis: Secondary | ICD-10-CM

## 2023-01-22 DIAGNOSIS — Z202 Contact with and (suspected) exposure to infections with a predominantly sexual mode of transmission: Secondary | ICD-10-CM

## 2023-01-22 DIAGNOSIS — Z113 Encounter for screening for infections with a predominantly sexual mode of transmission: Secondary | ICD-10-CM

## 2023-01-22 LAB — WET PREP, GENITAL
Sperm: NONE SEEN
Trich, Wet Prep: NONE SEEN
WBC, Wet Prep HPF POC: <10 (ref <10)

## 2023-01-22 LAB — HIV ANTIBODY (ROUTINE TESTING W REFLEX): HIV Screen 4th Generation wRfx: NONREACTIVE

## 2023-01-22 MED ORDER — FLUCONAZOLE 150 MG PO TABS
150.0000 mg | ORAL_TABLET | ORAL | 0 refills | Status: DC
Start: 2023-01-22 — End: 2023-05-20

## 2023-01-22 MED ORDER — METRONIDAZOLE 500 MG PO TABS: 500.0000 mg | ORAL_TABLET | Freq: Two times a day (BID) | ORAL | 0 refills | Status: DC

## 2023-01-22 MED ORDER — CEFTRIAXONE SODIUM 500 MG IJ SOLR
500.0000 mg | Freq: Once | INTRAMUSCULAR | Status: AC
  Administered 2023-01-22: 500 mg via INTRAMUSCULAR

## 2023-01-22 NOTE — ED Triage Notes (Signed)
Pt present to [facility] for STD testing. She states she is having yellow/green vaginal discharge and her partner recent told her he has gonorrhea.

## 2023-01-22 NOTE — ED Provider Notes (Signed)
[facility]    CSN: [id number] Arrival date & time: 01/22/23  0941      History   Chief Complaint Chief Complaint  Patient presents with   Exposure to STD     HPI HPI Katelyn Holland is a 47 y.o. female.    Katelyn Holland presents for STD testing with yellow-green vaginal discharge for the past week.  Tried nothing prior to arrival.  Has  not had any antibiotics in last 30 days.   Her partner told her this morning that he had gonorrhea.  Katelyn Holland does not use condoms regularly.   No LMP recorded. Patient has had a hysterectomy.    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   S/P total abdominal hysterectomy 01/09/2017   Nocturnal leg cramps 02/12/2016   Current low back pain (Location of Primary Source of Pain) (Left) 02/11/2016   Chronic flank pain (Left) 02/11/2016   Encounter for pain management planning 02/11/2016   Chronic pain 02/10/2016   Long term current use of opiate analgesic 02/10/2016   Long term prescription opiate use 02/10/2016   Opiate use 02/10/2016   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 2nd surgery repaired disc and compressed nerve for left leg   KIDNEY STONE SURGERY Left 2000   surgery through back   LAPAROSCOPIC GASTRIC SLEEVE RESECTION  2016   LITHOTRIPSY  2007   NEPHRECTOMY Left    NEPHROLITHOTOMY Left 07/23/2015   Procedure: NEPHROLITHOTOMY PERCUTANEOUS;  Surgeon: Vanna Scotland, MD;  Location: [facility];  Service: Urology;  Laterality: Left;    OB History   No obstetric history on file.      Home Medications    Prior to Admission medications   Medication Sig Start Date End Date Taking? Authorizing Provider  metroNIDAZOLE (FLAGYL) 500 MG tablet Take 1 tablet (500 mg total) by mouth 2 (two) times daily. 01/22/23  Yes Carrington Mullenax, DO  cetirizine (ZYRTEC) 10 MG tablet Take 1 tablet (10 mg total) by mouth daily. 07/27/20   Cuthriell, Delorise Royals, PA-C  ciprofloxacin-dexamethasone (CIPRODEX) OTIC suspension Place 4 drops into the left ear 2 (two) times daily. 07/27/20   Cuthriell, Delorise Royals, PA-C  diphenhydrAMINE (BENADRYL) 25 mg capsule Take 50 mg by mouth 2 (two) times daily.     [provider]  doxycycline (VIBRAMYCIN) 100 MG capsule Take 1 capsule (100 mg total) by mouth 2 (two) times daily. 01/23/23   Merrilee Jansky,  MD  fluconazole (DIFLUCAN) 150 MG tablet Take 1 tablet (150 mg total) by mouth every 3 (three) days. Take one tablet by mouth on Day 1. Repeat dose 2nd tablet on Day 3 if not improved. 01/22/23   Malak Duchesneau, Seward Meth, DO  fluticasone (FLONASE) 50 MCG/ACT nasal spray Place 1 spray into both nostrils 2 (two) times daily. 07/27/20   Cuthriell, Delorise Royals, PA-C  gabapentin (NEURONTIN) 300 MG capsule TAKE 1 CAPSULE(300 MG) BY MOUTH AT BEDTIME 06/11/18   Galen Manila, NP  ondansetron (ZOFRAN) 4 MG tablet Take 4 mg by mouth 2 (two) times daily.    [provider]  predniSONE (DELTASONE) 20 MG tablet Day 1-4 take 3 pills once daily.  Day 5-6 take 2 pills.  Day 7-8 take 1 pill.  Day 9-10 take 1/2 pill. 04/09/18    Galen Manila, NP  tiZANidine (ZANAFLEX) 4 MG tablet TAKE 1 TABLET(4 MG) BY MOUTH EVERY 8 HOURS AS NEEDED FOR MUSCLE SPASMS 01/14/19   Karamalegos, Netta Neat, DO  valACYclovir (VALTREX) 500 MG tablet TAKE 1 TABLET BY MOUTH ONCE DAILY. FOR FLARE, MAY TAKE 2 TABLETS TWICE DAILY FOR 1 DAY 07/10/18   Galen Manila, NP    Family History Family History  Problem Relation Age of Onset   Heart disease Mother 64   Cancer Maternal Grandmother    Heart disease Father    Kidney cancer Neg Hx    Bladder Cancer Neg Hx    Prostate cancer Neg Hx     Social History Social History   Tobacco Use   Smoking status: Never   Smokeless tobacco: Never  Vaping Use   Vaping status: Never Used  Substance Use Topics   Alcohol use: Yes    Alcohol/week: 0.0 - 1.0 standard drinks of alcohol    Comment: occasional 1 every couple of months   Drug use: No     Allergies   Dilaudid  [hydromorphone hcl], Iodinated contrast media, and Other   Review of Systems Review of Systems: :negative unless otherwise stated in HPI.      Physical Exam Triage Vital Signs ED Triage Vitals  Encounter Vitals Group     BP 01/22/23 1026 122/78     Systolic BP Percentile --      Diastolic BP Percentile --      Pulse Rate 01/22/23 1026 68     Resp 01/22/23 1026 16     Temp 01/22/23 1026 98.3 F (36.8 C)     Temp Source 01/22/23 1026 Oral     SpO2 01/22/23 1026 97 %     Weight 01/22/23 1025 179 lb 14.3 oz (81.6 kg)     Height 01/22/23 1025 5\' 5"  (1.651 m)     Head Circumference --      Peak Flow --      Pain Score 01/22/23 1024 0     Pain Loc --      Pain Education --      Exclude from Growth Chart --    No data found.  Updated Vital Signs BP 122/78 (BP Location: Left Arm)   Pulse 68   Temp 98.3 F (36.8 C) (Oral)   Resp 16   Ht 5\' 5"  (1.651 m)   Wt 81.6 kg   SpO2 97%   BMI 29.94 kg/m   Visual Acuity Right Eye Distance:   Left Eye Distance:   Bilateral Distance:    Right Eye  Near:   Left Eye Near:  Bilateral Near:     Physical Exam GEN: well appearing female in no acute distress  CVS: well perfused  RESP: speaking in full sentences without pause  GU: deferred, patient performed self swab  .    UC Treatments / Results  Labs (all labs ordered are listed, but only abnormal results are displayed) Labs Reviewed  WET PREP, GENITAL - Abnormal; Notable for the following components:      Result Value   Yeast Wet Prep HPF POC PRESENT (*)    Clue Cells Wet Prep HPF POC PRESENT (*)    All other components within normal limits  CERVICOVAGINAL ANCILLARY ONLY - Abnormal; Notable for the following components:   Neisseria Gonorrhea Positive (*)    Chlamydia Positive (*)    All other components within normal limits  RPR  HIV ANTIBODY (ROUTINE TESTING W REFLEX)    EKG   Radiology No results found.  Procedures Procedures (including critical care time)  Medications Ordered in UC Medications  cefTRIAXone (ROCEPHIN) injection 500 mg (500 mg Intramuscular Given 01/22/23 1107)    Initial Impression / Assessment and Plan / UC Course  I have reviewed the triage vital signs and the nursing notes.  Pertinent labs & imaging results that were available during my care of the patient were reviewed by me and considered in my medical decision making (see chart for details).      Patient is a 47 y.o.Marland Kitchen female  who presents for STD exposure with vaginal discharge.  Overall patient is well-appearing and afebrile.  Vital signs stable.  Wet prep showing evidence of yeast vaginitis and bacterial vaginitis but no trichomonas. Gonorrhea and Chlamydia testing obtained.  Discussed treatment for gonorrhea chlamydia and patient prefers to wait on her testing for chlamydia and just getting treated for gonorrhea for now.  Given ceftriaxone 500 mg IM.  Recommended HIV and RPR as she has not had this in a while.  Patient agreeable and obtained.  Prescribed Diflucan and metronidazole for  respective yeast and BV infections.   Return precautions including abdominal pain, fever, chills, nausea, or vomiting given. Discussed MDM, treatment plan and plan for follow-up with patient who agrees with plan.           Final Clinical Impressions(s) / UC Diagnoses   Final diagnoses:  Exposure to STD  Vaginal discharge  Screen for STD (sexually transmitted disease)  Bacterial vaginosis  Yeast vaginitis     Discharge Instructions      You were tested for gonorrhea, chlamydia, HIV and syphilis here.  Your results will return in the next 48 to 72 hours.  If positive, someone will call you and you we will see your results in MyChart.  Your vaginal swab was concerning for bacterial vaginosis and a yeast infection.  Stop by the pharmacy to pick up your prescriptions for  these.     ED Prescriptions     Medication Sig Dispense Auth. Provider   fluconazole (DIFLUCAN) 150 MG tablet Take 1 tablet (150 mg total) by mouth every 3 (three) days. Take one tablet by mouth on Day 1. Repeat dose 2nd tablet on Day 3 if not improved. 2 tablet Hasaan Radde, DO   metroNIDAZOLE (FLAGYL) 500 MG tablet Take 1 tablet (500 mg total) by mouth 2 (two) times daily. 14 tablet Cullen Lahaie, Seward Meth, DO      PDMP not reviewed this encounter.   Katha Cabal, DO 01/25/23 1012

## 2023-01-22 NOTE — Discharge Instructions (Addendum)
You were tested for gonorrhea, chlamydia, HIV and syphilis here.  Your results will return in the next 48 to 72 hours.  If positive, someone will call you and you we will see your results in MyChart.  Your vaginal swab was concerning for bacterial vaginosis and a yeast infection.  Stop by the pharmacy to pick up your prescriptions for  these.

## 2023-01-23 ENCOUNTER — Telehealth: Payer: Self-pay

## 2023-01-23 LAB — CERVICOVAGINAL ANCILLARY ONLY
Chlamydia: POSITIVE — AB
Comment: NEGATIVE
Comment: NORMAL
Neisseria Gonorrhea: POSITIVE — AB

## 2023-01-23 LAB — RPR: RPR Ser Ql: NONREACTIVE

## 2023-01-23 MED ORDER — DOXYCYCLINE HYCLATE 100 MG PO CAPS: 100.0000 mg | ORAL_CAPSULE | Freq: Two times a day (BID) | ORAL | 0 refills | Status: DC

## 2023-01-23 NOTE — Telephone Encounter (Signed)
Per protocol, pt requires tx with Doxycycline. Reviewed with patient, verified pharmacy, prescription sent.

## 2023-04-22 LAB — COLOGUARD: COLOGUARD: NEGATIVE

## 2023-04-22 LAB — EXTERNAL GENERIC LAB PROCEDURE: COLOGUARD: NEGATIVE

## 2023-05-20 ENCOUNTER — Encounter: Payer: Self-pay | Admitting: Emergency Medicine

## 2023-05-20 ENCOUNTER — Ambulatory Visit
Admission: EM | Admit: 2023-05-20 | Discharge: 2023-05-20 | Disposition: A | Discharge status: Home / Self Care | Payer: 59 | Attending: Physician Assistant | Admitting: Physician Assistant

## 2023-05-20 DIAGNOSIS — B9689 Other specified bacterial agents as the cause of diseases classified elsewhere: Secondary | ICD-10-CM | POA: Diagnosis present

## 2023-05-20 DIAGNOSIS — M545 Low back pain, unspecified: Secondary | ICD-10-CM | POA: Insufficient documentation

## 2023-05-20 DIAGNOSIS — N760 Acute vaginitis: Secondary | ICD-10-CM | POA: Insufficient documentation

## 2023-05-20 LAB — URINALYSIS, W/ REFLEX TO CULTURE (INFECTION SUSPECTED)
Bilirubin Urine: NEGATIVE
Glucose, UA: NEGATIVE mg/dL
Hgb urine dipstick: NEGATIVE
Ketones, ur: NEGATIVE mg/dL
Leukocytes,Ua: NEGATIVE
Nitrite: NEGATIVE
Protein, ur: NEGATIVE mg/dL
RBC / HPF: NONE SEEN RBC/hpf (ref 0–5)
Specific Gravity, Urine: 1.025 (ref 1.005–1.030)
pH: 7 (ref 5.0–8.0)

## 2023-05-20 LAB — WET PREP, GENITAL
Sperm: NONE SEEN
Trich, Wet Prep: NONE SEEN
WBC, Wet Prep HPF POC: <10 — AB (ref <10)
Yeast Wet Prep HPF POC: NONE SEEN

## 2023-05-20 MED ORDER — METRONIDAZOLE 500 MG PO TABS: 500.0000 mg | ORAL_TABLET | Freq: Two times a day (BID) | ORAL | 0 refills | Status: AC

## 2023-05-20 NOTE — ED Triage Notes (Signed)
Patient c/o right sided back pain and vaginal discharge that started 4 days ago. Patient denies urinary discomfort or frequency.  Patient denies fevers.  Patient denies N/V.

## 2023-05-20 NOTE — Discharge Instructions (Addendum)
The most common types of vaginal infections are yeast infections and bacterial vaginosis. Neither of which are really considered to be sexually transmitted. Often a pH swab or wet prep is performed and if abnormal may reveal either type of infection. Begin metronidazole if prescribed for possible BV infection. If there is concern for yeast infection, fluconazole is often prescribed . Take this as directed. You may also apply topical miconazole (can be purchased OTC) externally for relief of itching. Increase rest and fluid intake. If labs sent out, we will call within 2-5 days with results and amend treatment if necessary. Always try to use pH balanced washes/wipes, urinate after intercourse, stay hydrated, and take probiotics if you are prone to vaginal infections. Return or see PCP or gynecologist for new/worsening infections.    Take Tylenol for your back pain. Also try heating pad and muscle rubs, stretches.

## 2023-05-20 NOTE — ED Provider Notes (Signed)
[facility]    CSN: [id number] Arrival date & time: 05/20/23  0936      History   Chief Complaint Chief Complaint  Patient presents with   Vaginal Discharge   Back Pain    HPI Katelyn Holland is a 47 y.o. female presenting for vaginal discharge which is thin and does not have an odor x 4 days.  Also reports right lower back pain.  Pain does not radiate.  Nothing makes this pain worse.  She is not complaining of any dysuria, frequency or urgency.  No abdominal or pelvic pain.  No report of any concern for STIs.  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  Medical history also significant for chronic back pain.  No OTC meds attempted.  No other complaints.  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   S/P total abdominal hysterectomy 01/09/2017   Nocturnal leg cramps 02/12/2016   Current low back pain (Location of Primary Source of Pain) (Left) 02/11/2016   Chronic flank pain (Left) 02/11/2016   Encounter for pain management planning 02/11/2016   Chronic pain 02/10/2016   Long term current use of opiate analgesic 02/10/2016   Long term prescription opiate use 02/10/2016   Opiate use 02/10/2016   UPJ (ureteropelvic junction) obstruction 07/09/2015   Hypokalemia 07/09/2015   Anemia 07/09/2015   Chronic constipation 07/07/2015   Family history of heart disease in female family member before age 63 12/24/2014   Low libido 12/24/2014   Obesity (BMI 30.0-34.9) 04/10/2014    Past Surgical History:  Procedure Laterality Date   ABDOMINAL HYSTERECTOMY     APPENDECTOMY     BACK SURGERY     x 2  Initial surgery unknown procedure. 2nd surgery repaired disc and compressed nerve for left leg   KIDNEY STONE SURGERY Left 2000    surgery through back   LAPAROSCOPIC GASTRIC SLEEVE RESECTION  2016   LITHOTRIPSY  2007   NEPHRECTOMY Left    NEPHROLITHOTOMY Left 07/23/2015   Procedure: NEPHROLITHOTOMY PERCUTANEOUS;  Surgeon: Vanna Scotland, MD;  Location: [facility];  Service: Urology;  Laterality: Left;    OB History   No obstetric history on file.      Home Medications    Prior to Admission medications   Medication Sig Start Date End Date Taking? Authorizing Provider  metroNIDAZOLE (FLAGYL) 500 MG tablet Take 1 tablet (500 mg total) by mouth 2 (two) times daily for 7 days. 05/20/23 05/27/23 Yes Shirlee Latch, PA-C  cetirizine (ZYRTEC) 10 MG tablet Take 1 tablet (10 mg total) by mouth daily. 07/27/20   Cuthriell, Delorise Royals, PA-C  diphenhydrAMINE (BENADRYL) 25 mg capsule Take 50 mg by mouth 2 (two) times daily.     [provider]  fluticasone (FLONASE) 50 MCG/ACT nasal spray Place 1 spray into both nostrils 2 (two) times daily. 07/27/20   Cuthriell, Delorise Royals, PA-C  gabapentin (NEURONTIN) 300 MG capsule TAKE 1 CAPSULE(300 MG) BY MOUTH AT BEDTIME 06/11/18   Galen Manila, NP  ondansetron (ZOFRAN) 4 MG tablet Take 4 mg by mouth 2 (two) times daily.    [provider]  predniSONE (DELTASONE) 20 MG  tablet Day 1-4 take 3 pills once daily.  Day 5-6 take 2 pills.  Day 7-8 take 1 pill.  Day 9-10 take 1/2 pill. 04/09/18   Galen Manila, NP  tiZANidine (ZANAFLEX) 4 MG tablet TAKE 1 TABLET(4 MG) BY MOUTH EVERY 8 HOURS AS NEEDED FOR MUSCLE SPASMS 01/14/19   Karamalegos, Netta Neat, DO  valACYclovir (VALTREX) 500 MG tablet TAKE 1 TABLET BY MOUTH ONCE DAILY. FOR FLARE, MAY TAKE 2 TABLETS TWICE DAILY FOR 1 DAY 07/10/18   Galen Manila, NP    Family History Family History  Problem Relation Age of Onset   Heart disease Mother 57   Cancer Maternal Grandmother    Heart disease Father    Kidney cancer Neg Hx    Bladder Cancer Neg Hx    Prostate cancer Neg Hx     Social History Social  History   Tobacco Use   Smoking status: Never   Smokeless tobacco: Never  Vaping Use   Vaping status: Never Used  Substance Use Topics   Alcohol use: Yes    Alcohol/week: 0.0 - 1.0 standard drinks of alcohol    Comment: occasional 1 every couple of months   Drug use: No     Allergies   Dilaudid  [hydromorphone hcl], Iodinated contrast media, and Other   Review of Systems Review of Systems  Constitutional:  Negative for fatigue and fever.  HENT:  Negative for mouth sores.   Gastrointestinal:  Negative for abdominal pain.  Genitourinary:  Positive for vaginal discharge. Negative for dysuria, flank pain, frequency, hematuria, urgency, vaginal bleeding and vaginal pain.  Musculoskeletal:  Positive for back pain.  Skin:  Negative for rash.     Physical Exam Triage Vital Signs  No data found.  Updated Vital Signs BP 105/65 (BP Location: Left Arm)   Pulse 66   Temp 98.1 F (36.7 C) (Oral)   Resp 14   Ht 5\' 5"  (1.651 m)   Wt 190 lb (86.2 kg)   SpO2 100%   BMI 31.62 kg/m      Physical Exam Vitals and nursing note reviewed.  Constitutional:      General: She is not in acute distress.    Appearance: Normal appearance. She is not ill-appearing or toxic-appearing.  HENT:     Head: Normocephalic and atraumatic.  Eyes:     General: No scleral icterus.       Right eye: No discharge.        Left eye: No discharge.     Conjunctiva/sclera: Conjunctivae normal.  Cardiovascular:     Rate and Rhythm: Normal rate and regular rhythm.     Heart sounds: Normal heart sounds.  Pulmonary:     Effort: Pulmonary effort is normal. No respiratory distress.     Breath sounds: Normal breath sounds.  Abdominal:     Palpations: Abdomen is soft.     Tenderness: There is no abdominal tenderness. There is no right CVA tenderness or left CVA tenderness.  Musculoskeletal:     Cervical back: Neck supple.     Comments: TTP lower l-spine and right paralumbar muscles. Full ROM of back.   Skin:    General: Skin is dry.  Neurological:     General: No focal deficit present.     Mental Status: She is alert. Mental status is at baseline.     Motor: No weakness.     Gait: Gait normal.  Psychiatric:        Mood and Affect:  Mood normal.        Behavior: Behavior normal.      UC Treatments / Results  Labs (all labs ordered are listed, but only abnormal results are displayed) Labs Reviewed  WET PREP, GENITAL - Abnormal; Notable for the following components:      Result Value   Clue Cells Wet Prep HPF POC PRESENT (*)    WBC, Wet Prep HPF POC <10 (*)    All other components within normal limits  URINALYSIS, W/ REFLEX TO CULTURE (INFECTION SUSPECTED) - Abnormal; Notable for the following components:   Bacteria, UA FEW (*)    All other components within normal limits    EKG   Radiology No results found.  Procedures Procedures (including critical care time)  Medications Ordered in UC Medications - No data to display  Initial Impression / Assessment and Plan / UC Course  I have reviewed the triage vital signs and the nursing notes.  Pertinent labs & imaging results that were available during my care of the patient were reviewed by me and considered in my medical decision making (see chart for details).   47 year old female presenting for thin vaginal discharge x 4 days.  No report of any noticeable odor.  No vaginal itching.  Also reports right sided lower back pain but has history of chronic back pain.  No urinary symptoms.  Vitals are stable.  She is overall well-appearing.  On exam she has no abdominal tenderness or CVA tenderness.  She does have tenderness of her lower back.  Patient performs vaginal self swab and is positive for clue cells.  Reviewed results with patient.  At this time treating with metronidazole.  Back pain is likely musculoskeletal.  Advised Tylenol, heating pad.  Also, plenty of rest and fluids. Follow-up as needed.   Final Clinical  Impressions(s) / UC Diagnoses   Final diagnoses:  Bacterial vaginosis  Acute right-sided low back pain without sciatica     Discharge Instructions      The most common types of vaginal infections are yeast infections and bacterial vaginosis. Neither of which are really considered to be sexually transmitted. Often a pH swab or wet prep is performed and if abnormal may reveal either type of infection. Begin metronidazole if prescribed for possible BV infection. If there is concern for yeast infection, fluconazole is often prescribed . Take this as directed. You may also apply topical miconazole (can be purchased OTC) externally for relief of itching. Increase rest and fluid intake. If labs sent out, we will call within 2-5 days with results and amend treatment if necessary. Always try to use pH balanced washes/wipes, urinate after intercourse, stay hydrated, and take probiotics if you are prone to vaginal infections. Return or see PCP or gynecologist for new/worsening infections.    Take Tylenol for your back pain. Also try heating pad and muscle rubs, stretches.        ED Prescriptions     Medication Sig Dispense Auth. Provider   metroNIDAZOLE (FLAGYL) 500 MG tablet Take 1 tablet (500 mg total) by mouth 2 (two) times daily for 7 days. 14 tablet Gareth Morgan      PDMP not reviewed this encounter.      Shirlee Latch, PA-C 05/20/23 1118

## 2023-06-01 ENCOUNTER — Ambulatory Visit
Admission: EM | Admit: 2023-06-01 | Discharge: 2023-06-01 | Disposition: A | Discharge status: Home / Self Care | Payer: 59 | Attending: Internal Medicine | Admitting: Internal Medicine

## 2023-06-01 DIAGNOSIS — J069 Acute upper respiratory infection, unspecified: Secondary | ICD-10-CM | POA: Insufficient documentation

## 2023-06-01 DIAGNOSIS — J029 Acute pharyngitis, unspecified: Secondary | ICD-10-CM | POA: Diagnosis present

## 2023-06-01 LAB — RESP PANEL BY RT-PCR (FLU A&B, COVID) ARPGX2
Influenza A by PCR: NEGATIVE
Influenza B by PCR: NEGATIVE
SARS Coronavirus 2 by RT PCR: NEGATIVE

## 2023-06-01 LAB — GROUP A STREP BY PCR: Group A Strep by PCR: NOT DETECTED

## 2023-06-01 MED ORDER — PROMETHAZINE-DM 6.25-15 MG/5ML PO SYRP
5.0000 mL | ORAL_SOLUTION | Freq: Four times a day (QID) | ORAL | 0 refills | Status: DC | PRN
Start: 2023-06-01 — End: 2023-07-29

## 2023-06-01 NOTE — ED Triage Notes (Signed)
Pt c/o nausea, temperature of 100.5, loss of voice, cough x3days  Pt has been taking otc dayquil and states that it is not working.

## 2023-06-01 NOTE — Discharge Instructions (Signed)
You may take provide DM as needed for cough.  Please as medication can make you drowsy.  Do not drink alcohol drive on this medication.  Lots of rest and fluids.  Over-the-counter Tylenol ibuprofen as needed.  Salt water gargles and warm liquids.  Please follow-up with your PCP if your symptoms do not improve.  Please go to the ER for any worsening symptoms.  I hope you feel better soon!

## 2023-06-01 NOTE — ED Provider Notes (Signed)
[facility]    CSN: [id number] Arrival date & time: 06/01/23  1322      History   Chief Complaint Chief Complaint  Patient presents with   Nausea    HPI Katelyn Holland is a 47 y.o. female  presents for evaluation of URI symptoms for 3 days. Patient reports associated symptoms of cough, congestion, sore throat, fever of 100.5, laryngitis, nausea. Denies V/D, ear pain, body aches, shortness of breath. Patient does not have a hx of asthma. Patient does not have a history of smoking.  Reports no sick contacts.  Pt has taken DayQuil OTC for symptoms. Pt has no other concerns at this time.   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   S/P total abdominal hysterectomy 01/09/2017   Nocturnal leg cramps 02/12/2016   Current low back pain (Location of Primary Source of Pain) (Left) 02/11/2016   Chronic flank pain (Left) 02/11/2016   Encounter for pain management planning 02/11/2016   Chronic pain 02/10/2016   Long term current use of opiate analgesic 02/10/2016   Long term prescription opiate use 02/10/2016   Opiate use 02/10/2016   UPJ (ureteropelvic junction) obstruction 07/09/2015   Hypokalemia 07/09/2015   Anemia 07/09/2015   Chronic constipation 07/07/2015   Family history of heart disease in female family member before age 57 12/24/2014   Low libido 12/24/2014   Obesity (BMI 30.0-34.9) 04/10/2014    Past Surgical History:  Procedure Laterality Date   ABDOMINAL HYSTERECTOMY     APPENDECTOMY     BACK SURGERY     x 2  Initial surgery unknown procedure. 2nd surgery repaired disc and compressed nerve for left leg   KIDNEY STONE SURGERY Left 2000   surgery through back   LAPAROSCOPIC GASTRIC SLEEVE RESECTION  2016   LITHOTRIPSY  2007   NEPHRECTOMY Left    NEPHROLITHOTOMY  Left 07/23/2015   Procedure: NEPHROLITHOTOMY PERCUTANEOUS;  Surgeon: Vanna Scotland, MD;  Location: [facility];  Service: Urology;  Laterality: Left;    OB History   No obstetric history on file.      Home Medications    Prior to Admission medications   Medication Sig Start Date End Date Taking? Authorizing Provider  promethazine-dextromethorphan (PROMETHAZINE-DM) 6.25-15 MG/5ML syrup Take 5 mLs by mouth 4 (four) times daily as needed for cough. 06/01/23  Yes Radford Pax, NP  cetirizine (ZYRTEC) 10 MG tablet Take 1 tablet (10 mg total) by mouth daily. 07/27/20   Cuthriell, Delorise Royals, PA-C  diphenhydrAMINE (BENADRYL) 25 mg capsule Take 50 mg by mouth 2 (two) times daily.     [provider]  fluticasone (FLONASE) 50 MCG/ACT nasal spray Place 1 spray into both nostrils 2 (two) times daily. 07/27/20   Cuthriell, Delorise Royals, PA-C  gabapentin (NEURONTIN) 300 MG capsule TAKE 1 CAPSULE(300 MG) BY MOUTH AT BEDTIME 06/11/18   Galen Manila, NP  ondansetron (ZOFRAN) 4 MG tablet Take 4 mg by mouth 2 (two) times daily.    [provider]  predniSONE (DELTASONE) 20 MG tablet Day 1-4 take 3 pills once daily.  Day 5-6 take 2 pills.  Day 7-8 take 1 pill.  Day 9-10 take 1/2 pill. 04/09/18   Galen Manila, NP  tiZANidine (ZANAFLEX) 4 MG tablet TAKE 1 TABLET(4 MG) BY MOUTH EVERY 8 HOURS AS NEEDED FOR MUSCLE SPASMS 01/14/19   Karamalegos, Netta Neat, DO  valACYclovir (VALTREX) 500 MG tablet TAKE 1 TABLET BY MOUTH ONCE DAILY. FOR FLARE, MAY TAKE 2 TABLETS TWICE DAILY FOR 1 DAY 07/10/18   Galen Manila, NP    Family History Family History  Problem Relation Age of Onset   Heart disease Mother 58   Cancer Maternal Grandmother    Heart disease Father    Kidney cancer Neg Hx    Bladder Cancer Neg Hx    Prostate cancer Neg Hx     Social History Social History   Tobacco Use   Smoking status: Never   Smokeless tobacco: Never  Vaping Use   Vaping status: Never Used   Substance Use Topics   Alcohol use: Yes    Alcohol/week: 0.0 - 1.0 standard drinks of alcohol    Comment: occasional 1 every couple of months   Drug use: No     Allergies   Dilaudid  [hydromorphone hcl], Iodinated contrast media, and Other   Review of Systems Review of Systems  Constitutional:  Positive for fever.  HENT:  Positive for congestion and sore throat.   Respiratory:  Positive for cough.      Physical Exam Triage Vital Signs ED Triage Vitals  Encounter Vitals Group     BP 06/01/23 1334 121/69     Systolic BP Percentile --      Diastolic BP Percentile --      Pulse Rate 06/01/23 1334 100     Resp --      Temp 06/01/23 1334 99.3 F (37.4 C)     Temp Source 06/01/23 1334 Oral     SpO2 06/01/23 1334 98 %     Weight 06/01/23 1332 190 lb (86.2 kg)     Height 06/01/23 1332 5\' 5"  (1.651 m)     Head Circumference --      Peak Flow --      Pain Score 06/01/23 1331 4     Pain Loc --      Pain Education --      Exclude from Growth Chart --    No data found.  Updated Vital Signs BP 121/69 (BP Location: Left Arm)   Pulse 100   Temp 99.3 F (37.4 C) (Oral)   Ht 5\' 5"  (1.651 m)   Wt 190 lb (86.2 kg)   SpO2 98%   BMI 31.62 kg/m   Visual Acuity Right Eye Distance:   Left Eye Distance:   Bilateral Distance:    Right Eye Near:   Left Eye Near:    Bilateral Near:     Physical Exam Vitals and nursing note reviewed.  Constitutional:      General: She is not in acute distress.    Appearance: She is well-developed. She is not ill-appearing.  HENT:     Head: Normocephalic and atraumatic.     Right Ear: Tympanic membrane and ear canal normal.     Left Ear: Tympanic membrane and ear canal normal.     Nose: Congestion present.     Mouth/Throat:     Mouth: Mucous membranes are moist.     Pharynx: Oropharynx is clear. Uvula midline. Posterior oropharyngeal erythema present.     Tonsils: No tonsillar exudate or tonsillar abscesses.  Eyes:      Conjunctiva/sclera: Conjunctivae normal.     Pupils: Pupils are equal, round, and  reactive to light.  Cardiovascular:     Rate and Rhythm: Normal rate and regular rhythm.     Heart sounds: Normal heart sounds.  Pulmonary:     Effort: Pulmonary effort is normal.     Breath sounds: Normal breath sounds.  Musculoskeletal:     Cervical back: Normal range of motion and neck supple.  Lymphadenopathy:     Cervical: No cervical adenopathy.  Skin:    General: Skin is warm and dry.  Neurological:     General: No focal deficit present.     Mental Status: She is alert and oriented to person, place, and time.  Psychiatric:        Mood and Affect: Mood normal.        Behavior: Behavior normal.      UC Treatments / Results  Labs (all labs ordered are listed, but only abnormal results are displayed) Labs Reviewed  RESP PANEL BY RT-PCR (FLU A&B, COVID) ARPGX2  GROUP A STREP BY PCR    EKG   Radiology No results found.  Procedures Procedures (including critical care time)  Medications Ordered in UC Medications - No data to display  Initial Impression / Assessment and Plan / UC Course  I have reviewed the triage vital signs and the nursing notes.  Pertinent labs & imaging results that were available during my care of the patient were reviewed by me and considered in my medical decision making (see chart for details).     Reviewed exam and symptoms with patient.  No red flags.  Negative COVID, flu, strep PCR.  Discussed viral illness and symptomatic treatment.  Promethazine DM as needed for cough, side effect profile reviewed.  OTC analgesics as needed.  PCP follow-up as symptoms do not improve.  ER precautions reviewed. Final Clinical Impressions(s) / UC Diagnoses   Final diagnoses:  Sore throat  Viral upper respiratory illness     Discharge Instructions      You may take provide DM as needed for cough.  Please as medication can make you drowsy.  Do not drink alcohol drive  on this medication.  Lots of rest and fluids.  Over-the-counter Tylenol ibuprofen as needed.  Salt water gargles and warm liquids.  Please follow-up with your PCP if your symptoms do not improve.  Please go to the ER for any worsening symptoms.  I hope you feel better soon!     ED Prescriptions     Medication Sig Dispense Auth. Provider   promethazine-dextromethorphan (PROMETHAZINE-DM) 6.25-15 MG/5ML syrup Take 5 mLs by mouth 4 (four) times daily as needed for cough. 118 mL Radford Pax, NP      PDMP not reviewed this encounter.   Radford Pax, NP 06/01/23 1430

## 2023-07-29 ENCOUNTER — Ambulatory Visit
Admission: EM | Admit: 2023-07-29 | Discharge: 2023-07-29 | Disposition: A | Discharge status: Home / Self Care | Payer: 59 | Attending: Family | Admitting: Family

## 2023-07-29 ENCOUNTER — Encounter: Payer: Self-pay | Admitting: Emergency Medicine

## 2023-07-29 ENCOUNTER — Ambulatory Visit: Payer: 59

## 2023-07-29 DIAGNOSIS — R0981 Nasal congestion: Secondary | ICD-10-CM | POA: Diagnosis not present

## 2023-07-29 DIAGNOSIS — J209 Acute bronchitis, unspecified: Secondary | ICD-10-CM

## 2023-07-29 DIAGNOSIS — R5383 Other fatigue: Secondary | ICD-10-CM

## 2023-07-29 DIAGNOSIS — R051 Acute cough: Secondary | ICD-10-CM | POA: Diagnosis not present

## 2023-07-29 DIAGNOSIS — R0602 Shortness of breath: Secondary | ICD-10-CM

## 2023-07-29 MED ORDER — PREDNISONE 20 MG PO TABS: 40.0000 mg | ORAL_TABLET | Freq: Every day | ORAL | 0 refills | Status: AC

## 2023-07-29 MED ORDER — DOXYCYCLINE HYCLATE 100 MG PO CAPS: 100.0000 mg | ORAL_CAPSULE | Freq: Two times a day (BID) | ORAL | 0 refills | Status: AC

## 2023-07-29 MED ORDER — PSEUDOEPH-BROMPHEN-DM 30-2-10 MG/5ML PO SYRP: 10.0000 mL | ORAL_SOLUTION | Freq: Four times a day (QID) | ORAL | 0 refills | Status: AC | PRN

## 2023-07-29 NOTE — ED Triage Notes (Signed)
Patient c/o cough, chest congestion, and nasal congestion that started 3 weeks ago.  Patient has tried OTC cold medicine.  Patient has been taking her prescribed cough medicine.  Patient denies fevers.

## 2023-07-29 NOTE — Discharge Instructions (Addendum)
-  Your chest x-ray does not show pneumonia.  You have bronchitis.  Bronchitis can last for 2 to 6 weeks. - Increase rest and fluids.  I sent cough medicine, an antibiotic and a corticosteroid to the pharmacy. - You should return if you not feeling better in a couple more weeks, you develop fever, have worsening cough or increased shortness of breath.

## 2023-07-29 NOTE — ED Provider Notes (Signed)
[facility]    CSN: [id number] Arrival date & time: 07/29/23  1509      History   Chief Complaint Chief Complaint  Patient presents with   Cough   Nasal Congestion    HPI Katelyn Holland is a 48 y.o. female presenting for 3-week history of productive cough, nasal congestion, chest congestion, frontal headaches, fatigue and difficulty sleeping.  Denies fever but has had chills.  Denies sore throat, chest pain or wheezing.  No history of asthma or COPD.  Patient has never smoked.  She has tried multiple over-the-counter cough medications without relief and feels that her symptoms are progressively worsening.  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   S/P total abdominal hysterectomy 01/09/2017   Nocturnal leg cramps 02/12/2016   Current low back pain (Location of Primary Source of Pain) (Left) 02/11/2016   Chronic flank pain (Left) 02/11/2016   Encounter for pain management planning 02/11/2016   Chronic pain 02/10/2016   Long term current use of opiate analgesic 02/10/2016   Long term prescription opiate use 02/10/2016   Opiate use 02/10/2016   UPJ (ureteropelvic junction) obstruction 07/09/2015   Hypokalemia 07/09/2015   Anemia 07/09/2015   Chronic constipation 07/07/2015   Family history of heart disease in female family member before age 39 12/24/2014   Low libido 12/24/2014   Obesity (BMI 30.0-34.9) 04/10/2014    Past Surgical History:  Procedure Laterality Date   ABDOMINAL HYSTERECTOMY     APPENDECTOMY     BACK SURGERY     x 2  Initial surgery unknown procedure. 2nd surgery repaired disc and compressed nerve for left leg   KIDNEY STONE SURGERY Left 2000   surgery through back   LAPAROSCOPIC GASTRIC SLEEVE RESECTION  2016   LITHOTRIPSY  2007   NEPHRECTOMY Left     NEPHROLITHOTOMY Left 07/23/2015   Procedure: NEPHROLITHOTOMY PERCUTANEOUS;  Surgeon: Vanna Scotland, MD;  Location: [facility];  Service: Urology;  Laterality: Left;    OB History   No obstetric history on file.      Home Medications    Prior to Admission medications   Medication Sig Start Date End Date Taking? Authorizing Provider  brompheniramine-pseudoephedrine-DM 30-2-10 MG/5ML syrup Take 10 mLs by mouth 4 (four) times daily as needed for up to 7 days. 07/29/23 08/05/23 Yes Shirlee Latch, PA-C  doxycycline (VIBRAMYCIN) 100 MG capsule Take 1 capsule (100 mg total) by mouth 2 (two) times daily for 7 days. 07/29/23 08/05/23 Yes Shirlee Latch, PA-C  predniSONE (DELTASONE) 20 MG tablet Take 2 tablets (40 mg total) by mouth daily for 5 days. 07/29/23 08/03/23 Yes Shirlee Latch, PA-C  cetirizine (ZYRTEC) 10 MG tablet Take 1 tablet (10 mg total) by mouth daily. 07/27/20   Cuthriell, Delorise Royals, PA-C  diphenhydrAMINE (BENADRYL) 25 mg capsule Take 50 mg by mouth 2 (two) times daily.     [provider]  fluticasone (FLONASE) 50 MCG/ACT nasal spray Place 1 spray into both nostrils 2 (two) times daily. 07/27/20   Cuthriell, Delorise Royals, PA-C  gabapentin (NEURONTIN) 300 MG capsule TAKE 1 CAPSULE(300 MG) BY MOUTH AT BEDTIME 06/11/18   Galen Manila, NP  ondansetron (ZOFRAN) 4 MG tablet Take 4 mg by  mouth 2 (two) times daily.    [provider]  tiZANidine (ZANAFLEX) 4 MG tablet TAKE 1 TABLET(4 MG) BY MOUTH EVERY 8 HOURS AS NEEDED FOR MUSCLE SPASMS 01/14/19   Karamalegos, Netta Neat, DO  valACYclovir (VALTREX) 500 MG tablet TAKE 1 TABLET BY MOUTH ONCE DAILY. FOR FLARE, MAY TAKE 2 TABLETS TWICE DAILY FOR 1 DAY 07/10/18   Galen Manila, NP    Family History Family History  Problem Relation Age of Onset   Heart disease Mother 52   Cancer Maternal Grandmother    Heart disease Father    Kidney cancer Neg Hx    Bladder Cancer Neg Hx    Prostate cancer Neg Hx      Social History Social History   Tobacco Use   Smoking status: Never   Smokeless tobacco: Never  Vaping Use   Vaping status: Never Used  Substance Use Topics   Alcohol use: Yes    Alcohol/week: 0.0 - 1.0 standard drinks of alcohol    Comment: occasional 1 every couple of months   Drug use: No     Allergies   Dilaudid  [hydromorphone hcl], Iodinated contrast media, and Other   Review of Systems Review of Systems  Constitutional:  Positive for chills and fatigue. Negative for diaphoresis and fever.  HENT:  Positive for congestion, rhinorrhea and sinus pressure. Negative for ear pain, sinus pain and sore throat.   Respiratory:  Positive for cough and shortness of breath.   Cardiovascular:  Negative for chest pain.  Gastrointestinal:  Negative for abdominal pain, nausea and vomiting.  Musculoskeletal:  Negative for arthralgias and myalgias.  Skin:  Negative for rash.  Neurological:  Positive for headaches. Negative for weakness.  Hematological:  Negative for adenopathy.     Physical Exam Triage Vital Signs ED Triage Vitals  Encounter Vitals Group     BP 07/29/23 1518 101/64     Systolic BP Percentile --      Diastolic BP Percentile --      Pulse Rate 07/29/23 1518 77     Resp 07/29/23 1518 14     Temp 07/29/23 1518 98.9 F (37.2 C)     Temp Source 07/29/23 1518 Oral     SpO2 07/29/23 1518 96 %     Weight 07/29/23 1516 190 lb 0.6 oz (86.2 kg)     Height 07/29/23 1516 5\' 5"  (1.651 m)     Head Circumference --      Peak Flow --      Pain Score 07/29/23 1517 4     Pain Loc --      Pain Education --      Exclude from Growth Chart --    No data found.  Updated Vital Signs BP 101/64 (BP Location: Right Arm)   Pulse 77   Temp 98.9 F (37.2 C) (Oral)   Resp 14   Ht 5\' 5"  (1.651 m)   Wt 190 lb 0.6 oz (86.2 kg)   SpO2 96%   BMI 31.62 kg/m      Physical Exam Vitals and nursing note reviewed.  Constitutional:      General: She is not in acute distress.     Appearance: Normal appearance. She is ill-appearing. She is not toxic-appearing.     Comments: Coughs and clears throat frequently.  HENT:     Head: Normocephalic and atraumatic.     Nose: Congestion present.     Mouth/Throat:     Mouth: Mucous membranes are  moist.     Pharynx: Oropharynx is clear.  Eyes:     General: No scleral icterus.       Right eye: No discharge.        Left eye: No discharge.     Conjunctiva/sclera: Conjunctivae normal.  Cardiovascular:     Rate and Rhythm: Normal rate and regular rhythm.     Heart sounds: Normal heart sounds.  Pulmonary:     Effort: Pulmonary effort is normal. No respiratory distress.     Breath sounds: Rhonchi (few scattered rhonchi bilateral upper airway) present.  Musculoskeletal:     Cervical back: Neck supple.  Skin:    General: Skin is dry.  Neurological:     General: No focal deficit present.     Mental Status: She is alert. Mental status is at baseline.     Motor: No weakness.     Gait: Gait normal.  Psychiatric:        Mood and Affect: Mood normal.        Behavior: Behavior normal.      UC Treatments / Results  Labs (all labs ordered are listed, but only abnormal results are displayed) Labs Reviewed - No data to display  EKG   Radiology DG Chest 2 View Result Date: 07/29/2023 CLINICAL DATA:  Cough for 3 weeks, congestion EXAM: CHEST - 2 VIEW COMPARISON:  07/25/2017 FINDINGS: The heart size and mediastinal contours are within normal limits. Both lungs are clear. The visualized skeletal structures are unremarkable. IMPRESSION: No active cardiopulmonary disease. Electronically Signed   By: Sharlet Salina M.D.   On: 07/29/2023 15:33    Procedures Procedures (including critical care time)  Medications Ordered in UC Medications - No data to display  Initial Impression / Assessment and Plan / UC Course  I have reviewed the triage vital signs and the nursing notes.  Pertinent labs & imaging results that were  available during my care of the patient were reviewed by me and considered in my medical decision making (see chart for details).   48 year old female presents for 3-week history of productive cough, nasal congestion, headaches, difficulty sleeping and shortness of breath.  Reports symptoms of gotten worse instead of better.  No fever but has had chills.  Vitals are stable and normal.  She is ill-appearing but nontoxic.  On exam has nasal congestion.  Throat clear.  Few scattered rhonchi bilateral upper lung fields.  Chest x-ray obtained to assess for possible pneumonia.  X-ray negative.  Advised patient symptoms consistent with bronchitis, likely viral but she has been ill for 3 weeks so we will cover this time for possible bacterial causes.  I did advise her that bronchitis can last for 2 to 6 weeks and will just need to run its course.  Will try doxycycline, prednisone, Bromfed-DM.  Encouraged increasing rest and fluids.  Reviewed returning or going to ED for fever, worsening cough or increased shortness of breath.  Acute illness with systemic symptoms.   Final Clinical Impressions(s) / UC Diagnoses   Final diagnoses:  Acute bronchitis, unspecified organism  Acute cough  Nasal congestion  Shortness of breath  Other fatigue     Discharge Instructions      -Your chest x-ray does not show pneumonia.  You have bronchitis.  Bronchitis can last for 2 to 6 weeks. - Increase rest and fluids.  I sent cough medicine, an antibiotic and a corticosteroid to the pharmacy. - You should return if you not feeling better in a  couple more weeks, you develop fever, have worsening cough or increased shortness of breath.     ED Prescriptions     Medication Sig Dispense Auth. Provider   predniSONE (DELTASONE) 20 MG tablet Take 2 tablets (40 mg total) by mouth daily for 5 days. 10 tablet Eusebio Friendly B, PA-C   brompheniramine-pseudoephedrine-DM 30-2-10 MG/5ML syrup Take 10 mLs by mouth 4 (four)  times daily as needed for up to 7 days. 150 mL Eusebio Friendly B, PA-C   doxycycline (VIBRAMYCIN) 100 MG capsule Take 1 capsule (100 mg total) by mouth 2 (two) times daily for 7 days. 14 capsule Shirlee Latch, PA-C      PDMP not reviewed this encounter.   Shirlee Latch, PA-C 07/29/23 1547

## 2023-11-01 ENCOUNTER — Ambulatory Visit
Admission: EM | Admit: 2023-11-01 | Discharge: 2023-11-01 | Disposition: A | Discharge status: Home / Self Care | Attending: Emergency Medicine | Admitting: Emergency Medicine

## 2023-11-01 DIAGNOSIS — R051 Acute cough: Secondary | ICD-10-CM

## 2023-11-01 DIAGNOSIS — J40 Bronchitis, not specified as acute or chronic: Secondary | ICD-10-CM

## 2023-11-01 DIAGNOSIS — J029 Acute pharyngitis, unspecified: Secondary | ICD-10-CM

## 2023-11-01 LAB — GROUP A STREP BY PCR: Group A Strep by PCR: NOT DETECTED

## 2023-11-01 MED ORDER — BENZONATATE 100 MG PO CAPS: 100.0000 mg | ORAL_CAPSULE | Freq: Three times a day (TID) | ORAL | 0 refills | Status: DC

## 2023-11-01 MED ORDER — DOXYCYCLINE HYCLATE 100 MG PO CAPS: 100.0000 mg | ORAL_CAPSULE | Freq: Two times a day (BID) | ORAL | 0 refills | Status: AC

## 2023-11-01 NOTE — ED Triage Notes (Signed)
 Pt c/o cough & sore throat x1 wk.

## 2023-11-01 NOTE — ED Provider Notes (Signed)
 [facility]    CSN: [id number] Arrival date & time: 11/01/23  0847      History   Chief Complaint Chief Complaint  Patient presents with   Sore Throat   Cough    HPI Katelyn Holland is a 48 y.o. female.   Katelyn Holland, 48 y.o. female, presenting for 1-week history of worsening sore throat, productive cough, nasal congestion, chest congestion,fatigue and difficulty sleeping.  Denies fever or chills, denies chest pain or wheezing.  No history of asthma or COPD.  Patient has never smoked or vaped.  She has tried multiple over-the-counter cough medications without relief and feels that her symptoms are progressively worsening.  Last antibiotics were taken over a month ago : 07/2023 for bronchitis  The history is provided by the patient. No language interpreter was used.    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   S/P total abdominal hysterectomy 01/09/2017   Nocturnal leg cramps 02/12/2016   Current low back pain (Location of Primary Source of Pain) (Left) 02/11/2016   Chronic flank pain (Left) 02/11/2016   Encounter for pain management planning 02/11/2016   Chronic pain 02/10/2016   Long term current use of opiate analgesic 02/10/2016   Long term prescription opiate use 02/10/2016   Opiate use 02/10/2016   UPJ (ureteropelvic junction) obstruction 07/09/2015   Hypokalemia 07/09/2015   Anemia 07/09/2015   Chronic constipation 07/07/2015   Family history of heart disease in female family member before age 25 12/24/2014   Low libido 12/24/2014   Obesity (BMI 30.0-34.9) 04/10/2014    Past Surgical History:  Procedure Laterality Date   ABDOMINAL HYSTERECTOMY     APPENDECTOMY     BACK SURGERY     x  2  Initial surgery unknown procedure. 2nd surgery repaired disc and compressed nerve for left leg   KIDNEY STONE SURGERY Left 2000   surgery through back   LAPAROSCOPIC GASTRIC SLEEVE RESECTION  2016   LITHOTRIPSY  2007   NEPHRECTOMY Left    NEPHROLITHOTOMY Left 07/23/2015   Procedure: NEPHROLITHOTOMY PERCUTANEOUS;  Surgeon: Dustin Gimenez, MD;  Location: [facility];  Service: Urology;  Laterality: Left;    OB History   No obstetric history on file.      Home Medications    Prior to Admission medications   Medication Sig Start Date End Date Taking? Authorizing Provider  benzonatate  (TESSALON ) 100 MG capsule Take 1 capsule (100 mg total) by mouth every 8 (eight) hours. 11/01/23  Yes Nygeria Lager, Eveleen Hinds, NP  doxycycline  (VIBRAMYCIN ) 100 MG capsule Take 1 capsule (100 mg total) by mouth 2 (two) times daily for 7 days. 11/01/23 11/08/23 Yes Zarahi Fuerst, NP  cetirizine  (ZYRTEC ) 10 MG tablet Take 1 tablet (10 mg total) by mouth daily. 07/27/20   Cuthriell, Ardath Bears, PA-C  diphenhydrAMINE  (BENADRYL ) 25 mg capsule Take 50 mg by mouth 2 (two) times daily.     [provider]  fluticasone  (FLONASE ) 50 MCG/ACT nasal spray Place 1 spray into both nostrils 2 (two) times daily. 07/27/20   Cuthriell, Jonathan D, PA-C  gabapentin  (NEURONTIN ) 300 MG capsule TAKE 1 CAPSULE(300 MG) BY MOUTH AT BEDTIME 06/11/18  Brenda Calkin, NP  ondansetron  (ZOFRAN ) 4 MG tablet Take 4 mg by mouth 2 (two) times daily.    [provider]  tiZANidine  (ZANAFLEX ) 4 MG tablet TAKE 1 TABLET(4 MG) BY MOUTH EVERY 8 HOURS AS NEEDED FOR MUSCLE SPASMS 01/14/19   Karamalegos, Kayleen Party, DO  valACYclovir  (VALTREX ) 500 MG tablet TAKE 1 TABLET BY MOUTH ONCE DAILY. FOR FLARE, MAY TAKE 2 TABLETS TWICE DAILY FOR 1 DAY 07/10/18   Brenda Calkin, NP    Family History Family History  Problem Relation Age of Onset   Heart disease Mother 79   Cancer Maternal Grandmother    Heart disease Father    Kidney  cancer Neg Hx    Bladder Cancer Neg Hx    Prostate cancer Neg Hx     Social History Social History   Tobacco Use   Smoking status: Never   Smokeless tobacco: Never  Vaping Use   Vaping status: Never Used  Substance Use Topics   Alcohol use: Yes    Alcohol/week: 0.0 - 1.0 standard drinks of alcohol    Comment: occasional 1 every couple of months   Drug use: No     Allergies   Dilaudid   [hydromorphone  hcl], Iodinated contrast media, and Other   Review of Systems Review of Systems  Constitutional:  Negative for fever.  HENT:  Positive for congestion and sore throat.   Respiratory:  Positive for cough. Negative for shortness of breath, wheezing and stridor.   Cardiovascular:  Negative for chest pain and palpitations.  All other systems reviewed and are negative.    Physical Exam Triage Vital Signs ED Triage Vitals  Encounter Vitals Group     BP 11/01/23 0854 119/77     Systolic BP Percentile --      Diastolic BP Percentile --      Pulse Rate 11/01/23 0854 67     Resp 11/01/23 0854 16     Temp 11/01/23 0854 98.8 F (37.1 C)     Temp Source 11/01/23 0854 Oral     SpO2 11/01/23 0854 97 %     Weight 11/01/23 0852 185 lb (83.9 kg)     Height 11/01/23 0852 5\' 5"  (1.651 m)     Head Circumference --      Peak Flow --      Pain Score 11/01/23 0902 8     Pain Loc --      Pain Education --      Exclude from Growth Chart --    No data found.  Updated Vital Signs BP 119/77 (BP Location: Left Arm)   Pulse 67   Temp 98.8 F (37.1 C) (Oral)   Resp 16   Ht 5\' 5"  (1.651 m)   Wt 185 lb (83.9 kg)   SpO2 97%   BMI 30.79 kg/m   Visual Acuity Right Eye Distance:   Left Eye Distance:   Bilateral Distance:    Right Eye Near:   Left Eye Near:    Bilateral Near:     Physical Exam Vitals and nursing note reviewed.  Constitutional:      Appearance: She is well-developed and well-groomed.  HENT:     Head: Normocephalic.     Right Ear: Tympanic membrane is  retracted.     Left Ear: Tympanic membrane is retracted.     Nose: Mucosal edema and congestion present.     Right Sinus: No maxillary sinus tenderness or frontal sinus tenderness.  Left Sinus: No maxillary sinus tenderness or frontal sinus tenderness.     Mouth/Throat:     Lips: Pink.     Mouth: Mucous membranes are moist.     Pharynx: Uvula midline. Posterior oropharyngeal erythema present.     Tonsils: No tonsillar exudate or tonsillar abscesses.  Cardiovascular:     Rate and Rhythm: Normal rate and regular rhythm.     Heart sounds: Normal heart sounds.  Pulmonary:     Effort: Pulmonary effort is normal.     Breath sounds: Normal breath sounds and air entry.  Musculoskeletal:     Cervical back: Normal range of motion.  Neurological:     General: No focal deficit present.     Mental Status: She is alert and oriented to person, place, and time.     GCS: GCS eye subscore is 4. GCS verbal subscore is 5. GCS motor subscore is 6.  Psychiatric:        Attention and Perception: Attention normal.        Mood and Affect: Mood normal.        Speech: Speech normal.        Behavior: Behavior normal. Behavior is cooperative.      UC Treatments / Results  Labs (all labs ordered are listed, but only abnormal results are displayed) Labs Reviewed  GROUP A STREP BY PCR    EKG   Radiology No results found.  Procedures Procedures (including critical care time)  Medications Ordered in UC Medications - No data to display  Initial Impression / Assessment and Plan / UC Course  I have reviewed the triage vital signs and the nursing notes.  Pertinent labs & imaging results that were available during my care of the patient were reviewed by me and considered in my medical decision making (see chart for details).  Clinical Course as of 11/01/23 0947  Wed Nov 01, 2023  0939 Strep pending d/t worsening sore throat [JD]  0940 Strep negative, will treat with doxycycline  for bronchitis  [JD]    Clinical Course User Index [JD] Wilfred Dayrit, Eveleen Hinds, NP   Discussed exam findings and plan of care with patient, strict go to ER precautions given.   Patient verbalized understanding to this provider.  Ddx: Bronchitis, sore throat, acute cough Final Clinical Impressions(s) / UC Diagnoses   Final diagnoses:  Sore throat  Bronchitis  Acute cough     Discharge Instructions      Your strep test was negative We are treating you for bronchitis with tessalon  and doxycycline (take as directed). Drink plenty of water Follow up with PCP in 3 days if no improvement, sooner if worse     ED Prescriptions     Medication Sig Dispense Auth. Provider   benzonatate  (TESSALON ) 100 MG capsule Take 1 capsule (100 mg total) by mouth every 8 (eight) hours. 21 capsule Arnetha Silverthorne, NP   doxycycline  (VIBRAMYCIN ) 100 MG capsule Take 1 capsule (100 mg total) by mouth 2 (two) times daily for 7 days. 14 capsule Apphia Cropley, NP      PDMP not reviewed this encounter.   Peter Brands, NP 11/01/23 [phone]

## 2023-11-01 NOTE — Discharge Instructions (Addendum)
 Your strep test was negative We are treating you for bronchitis with tessalon  and doxycycline (take as directed). Drink plenty of water Follow up with PCP in 3 days if no improvement, sooner if worse

## 2024-01-01 ENCOUNTER — Ambulatory Visit: Admission: EM | Admit: 2024-01-01 | Discharge: 2024-01-01 | Disposition: A | Discharge status: Home / Self Care

## 2024-01-01 DIAGNOSIS — H6123 Impacted cerumen, bilateral: Secondary | ICD-10-CM | POA: Diagnosis not present

## 2024-01-01 NOTE — ED Provider Notes (Signed)
 [facility]    CSN: [id number] Arrival date & time: 01/01/24  0940      History   Chief Complaint Chief Complaint  Patient presents with   Otalgia   Ear Fullness    HPI Katelyn Holland is a 48 y.o. female presenting for bilateral ear fullness. She says she has been having pain in the left ear today. No drainage from ear. No recent illness and denies cough, congestion, sore throat, sinus pain, dizziness or headaches. Tried removing ear wax without relief.  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   S/P total abdominal hysterectomy 01/09/2017   Nocturnal leg cramps 02/12/2016   Current low back pain (Location of Primary Source of Pain) (Left) 02/11/2016   Chronic flank pain (Left) 02/11/2016   Encounter for pain management planning 02/11/2016   Chronic pain 02/10/2016   Long term current use of opiate analgesic 02/10/2016   Long term prescription opiate use 02/10/2016   Opiate use 02/10/2016   UPJ (ureteropelvic junction) obstruction 07/09/2015   Hypokalemia 07/09/2015   Anemia 07/09/2015   Chronic constipation 07/07/2015   Family history of heart disease in female family member before age 76 12/24/2014   Low libido 12/24/2014   Obesity (BMI 30.0-34.9) 04/10/2014    Past Surgical History:  Procedure Laterality Date   ABDOMINAL HYSTERECTOMY     APPENDECTOMY     BACK SURGERY     x 2  Initial surgery unknown procedure. 2nd surgery repaired disc and compressed nerve for left leg   KIDNEY STONE SURGERY Left 2000   surgery through back   LAPAROSCOPIC GASTRIC SLEEVE RESECTION  2016   LITHOTRIPSY  2007   NEPHRECTOMY Left    NEPHROLITHOTOMY Left 07/23/2015   Procedure: NEPHROLITHOTOMY PERCUTANEOUS;   Surgeon: Rosina Riis, MD;  Location: [facility];  Service: Urology;  Laterality: Left;    OB History   No obstetric history on file.      Home Medications    Prior to Admission medications   Medication Sig Start Date End Date Taking? Authorizing Provider  benzonatate  (TESSALON ) 100 MG capsule Take 1 capsule (100 mg total) by mouth every 8 (eight) hours. 11/01/23   Defelice, Jeanette, NP  cetirizine  (ZYRTEC ) 10 MG tablet Take 1 tablet (10 mg total) by mouth daily. 07/27/20   Cuthriell, Dorn BIRCH, PA-C  diphenhydrAMINE  (BENADRYL ) 25 mg capsule Take 50 mg by mouth 2 (two) times daily.     [provider]  fluticasone  (FLONASE ) 50 MCG/ACT nasal spray Place 1 spray into both nostrils 2 (two) times daily. 07/27/20   Cuthriell, Jonathan D, PA-C  gabapentin  (NEURONTIN ) 300 MG capsule TAKE 1 CAPSULE(300 MG) BY MOUTH AT BEDTIME 06/11/18   Nyle Tinnie Fuller, NP  ondansetron  (ZOFRAN ) 4 MG tablet Take 4 mg by mouth 2 (two) times daily.    [provider]  tiZANidine  (ZANAFLEX ) 4 MG tablet TAKE 1 TABLET(4 MG) BY MOUTH EVERY 8 HOURS AS NEEDED FOR MUSCLE SPASMS 01/14/19   Karamalegos, Marsa PARAS, DO  valACYclovir  (VALTREX ) 500 MG tablet TAKE 1 TABLET BY MOUTH ONCE DAILY. FOR FLARE, MAY TAKE 2 TABLETS TWICE DAILY FOR 1 DAY 07/10/18  Nyle Tinnie Fuller, NP    Family History Family History  Problem Relation Age of Onset   Heart disease Mother 71   Cancer Maternal Grandmother    Heart disease Father    Kidney cancer Neg Hx    Bladder Cancer Neg Hx    Prostate cancer Neg Hx     Social History Social History   Tobacco Use   Smoking status: Never   Smokeless tobacco: Never  Vaping Use   Vaping status: Never Used  Substance Use Topics   Alcohol use: Yes    Alcohol/week: 0.0 - 1.0 standard drinks of alcohol    Comment: occasional 1 every couple of months   Drug use: No     Allergies   Dilaudid   [hydromorphone  hcl], Iodinated contrast media, and Other   Review of  Systems Review of Systems  Constitutional:  Negative for fatigue and fever.  HENT:  Positive for ear pain and hearing loss. Negative for congestion, ear discharge, rhinorrhea and sore throat.   Respiratory:  Negative for cough.   Neurological:  Negative for dizziness and headaches.  Hematological:  Negative for adenopathy.     Physical Exam Triage Vital Signs ED Triage Vitals  Encounter Vitals Group     BP      Girls Systolic BP Percentile      Girls Diastolic BP Percentile      Boys Systolic BP Percentile      Boys Diastolic BP Percentile      Pulse      Resp      Temp      Temp src      SpO2      Weight      Height      Head Circumference      Peak Flow      Pain Score      Pain Loc      Pain Education      Exclude from Growth Chart    No data found.  Updated Vital Signs BP 111/67 (BP Location: Right Arm)   Pulse 67   Temp 98.2 F (36.8 C) (Oral)   Resp 16   SpO2 98%    Physical Exam Vitals and nursing note reviewed.  Constitutional:      General: She is not in acute distress.    Appearance: Normal appearance. She is not ill-appearing or toxic-appearing.  HENT:     Head: Normocephalic and atraumatic.     Right Ear: Tympanic membrane, ear canal and external ear normal. There is impacted cerumen.     Left Ear: Tympanic membrane, ear canal and external ear normal. There is impacted cerumen.     Nose: Nose normal.     Mouth/Throat:     Mouth: Mucous membranes are moist.     Pharynx: Oropharynx is clear.   Eyes:     General: No scleral icterus.       Right eye: No discharge.        Left eye: No discharge.     Conjunctiva/sclera: Conjunctivae normal.    Cardiovascular:     Rate and Rhythm: Normal rate.  Pulmonary:     Effort: Pulmonary effort is normal. No respiratory distress.   Musculoskeletal:     Cervical back: Neck supple.   Skin:    General: Skin is dry.   Neurological:     General: No focal deficit present.     Mental Status: She is  alert. Mental status is at baseline.  Motor: No weakness.     Gait: Gait normal.   Psychiatric:        Mood and Affect: Mood normal.        Behavior: Behavior normal.      UC Treatments / Results  Labs (all labs ordered are listed, but only abnormal results are displayed) Labs Reviewed - No data to display  EKG   Radiology No results found.  Procedures Procedures (including critical care time)  Medications Ordered in UC Medications - No data to display  Initial Impression / Assessment and Plan / UC Course  I have reviewed the triage vital signs and the nursing notes.  Pertinent labs & imaging results that were available during my care of the patient were reviewed by me and considered in my medical decision making (see chart for details).   48 y/o female presents for bilateral ear fullness and reduced hearing. Has left sided ear pain.   On exam, has cerumen impaction bilaterally.   Otic lavage to be performed by nursing staff.  Procedure performed by nursing staff.  Cerumen removed bilaterally.  No evidence of infection or injury on reexamination.  Reviewed follow back up as needed.   Final Clinical Impressions(s) / UC Diagnoses   Final diagnoses:  Bilateral impacted cerumen   Discharge Instructions   None    ED Prescriptions   None    PDMP not reviewed this encounter.   Arvis Jolan NOVAK, PA-C 01/01/24 1045

## 2024-01-01 NOTE — ED Triage Notes (Signed)
 Patient states that she think she has a build up ear wax in her left ear. She's been using wax remover. Patient has bilateral ear fullness with left ear pain.

## 2024-02-22 ENCOUNTER — Emergency Department: Payer: Self-pay

## 2024-02-22 ENCOUNTER — Other Ambulatory Visit: Payer: Self-pay

## 2024-02-22 ENCOUNTER — Emergency Department
Admission: EM | Admit: 2024-02-22 | Discharge: 2024-02-22 | Disposition: A | Discharge status: Home / Self Care | Payer: Self-pay | Attending: Emergency Medicine | Admitting: Emergency Medicine

## 2024-02-22 DIAGNOSIS — R0789 Other chest pain: Secondary | ICD-10-CM | POA: Insufficient documentation

## 2024-02-22 DIAGNOSIS — R42 Dizziness and giddiness: Secondary | ICD-10-CM | POA: Insufficient documentation

## 2024-02-22 LAB — CBC
HCT: 34.8 % — ABNORMAL LOW (ref 36.0–46.0)
Hemoglobin: 11.4 g/dL — ABNORMAL LOW (ref 12.0–15.0)
MCH: 27.8 pg (ref 26.0–34.0)
MCHC: 32.8 g/dL (ref 30.0–36.0)
MCV: 84.9 fL (ref 80.0–100.0)
Platelets: 232 K/uL (ref 150–400)
RBC: 4.1 MIL/uL (ref 3.87–5.11)
RDW: 12.1 % (ref 11.5–15.5)
WBC: 6.6 K/uL (ref 4.0–10.5)
nRBC: 0 % (ref 0.0–0.2)

## 2024-02-22 LAB — BASIC METABOLIC PANEL WITH GFR
Anion gap: 10 (ref 5–15)
BUN: 16 mg/dL (ref 6–20)
CO2: 24 mmol/L (ref 22–32)
Calcium: 9.4 mg/dL (ref 8.9–10.3)
Chloride: 104 mmol/L (ref 98–111)
Creatinine, Ser: 0.89 mg/dL (ref 0.44–1.00)
GFR, Estimated: >60 mL/min (ref >60)
Glucose, Bld: 103 mg/dL — ABNORMAL HIGH (ref 70–99)
Potassium: 4.1 mmol/L (ref 3.5–5.1)
Sodium: 138 mmol/L (ref 135–145)

## 2024-02-22 LAB — TROPONIN I (HIGH SENSITIVITY)
Troponin I (High Sensitivity): <2 ng/L (ref <18)
Troponin I (High Sensitivity): <2 ng/L (ref <18)

## 2024-02-22 NOTE — ED Triage Notes (Signed)
 BIB EMS for Sudden onset chest pain this morning with dizziness. Pain stayed in chest and did not radiate. Denies any pain at this time. Pt took goodies powder.

## 2024-02-22 NOTE — ED Provider Notes (Signed)
 [facility] Provider Note    Event Date/Time   First MD Initiated Contact with Patient 02/22/24 1510     (approximate)   History   Chest Pain   HPI  Katelyn Holland is a 48 y.o. female with a history of kidney stones, arthritis, and chronic constipation who presents with chest pain, acute onset around noon today, occurring while she was standing outside talking to somebody.  She states that the pain was sharp, sudden onset, and in the middle of her chest.  It lasted for about 3 minutes and then went away.  She subsequently had two further episodes like this, few minutes apart, and now has been chest pain-free for the last 3 hours.  She stated that during the third episode she started to feel lightheaded and somewhat short of breath.  She felt she might pass out.  However these symptoms have resolved as well.  She denies any prior history of this pain.  She has no nausea or vomiting.  She denies any leg swelling.  She has no cough or fever.  I reviewed the past medical records.  The patient's most recent prior outpatient encounter was on 6/23 at [facility] urgent care for ear fullness.  She has no cardiac history but does report a family history of heart disease including a heart attack in her mother.   Physical Exam   Triage Vital Signs: ED Triage Vitals  Encounter Vitals Group     BP 02/22/24 1233 (!) 120/94     Girls Systolic BP Percentile --      Girls Diastolic BP Percentile --      Boys Systolic BP Percentile --      Boys Diastolic BP Percentile --      Pulse Rate 02/22/24 1233 (!) 59     Resp 02/22/24 1233 18     Temp 02/22/24 1236 98.7 F (37.1 C)     Temp Source 02/22/24 1236 Oral     SpO2 02/22/24 1233 100 %     Weight 02/22/24 1231 206 lb (93.4 kg)     Height 02/22/24 1231 5' 5 (1.651 m)     Head Circumference --      Peak Flow --      Pain Score 02/22/24 1231 3     Pain Loc --      Pain Education --      Exclude from Growth Chart --      Most recent vital signs: Vitals:   02/22/24 1535 02/22/24 1623  BP:  (!) 106/54  Pulse:  (!) 55  Resp:  16  Temp:  97.6 F (36.4 C)  SpO2: 100% 100%    General: Alert, well-appearing, no distress.  CV:  Good peripheral perfusion.  Normal heart sounds. Resp:  Normal effort.  Lungs CTAB. Abd:  No distention.  Other:  No peripheral edema.  No calf or popliteal swelling or tenderness.   ED Results / Procedures / Treatments   Labs (all labs ordered are listed, but only abnormal results are displayed) Labs Reviewed  BASIC METABOLIC PANEL WITH GFR - Abnormal; Notable for the following components:      Result Value   Glucose, Bld 103 (*)    All other components within normal limits  CBC - Abnormal; Notable for the following components:   Hemoglobin 11.4 (*)    HCT 34.8 (*)    All other components within normal limits  TROPONIN I (HIGH SENSITIVITY)  TROPONIN I (HIGH  SENSITIVITY)     EKG  ED ECG REPORT I, Waylon Cassis, the attending physician, personally viewed and interpreted this ECG.  Date: 02/22/2024 EKG Time: 1232 Rate: 62 Rhythm: normal sinus rhythm QRS Axis: normal Intervals: normal ST/T Wave abnormalities: normal Narrative Interpretation: no evidence of acute ischemia    RADIOLOGY  Chest x-ray: I independently viewed and interpreted the images; there is no focal consolidation or edema    PROCEDURES:  Critical Care performed: No  Procedures   MEDICATIONS ORDERED IN ED: Medications - No data to display   IMPRESSION / MDM / ASSESSMENT AND PLAN / ED COURSE  I reviewed the triage vital signs and the nursing notes.  48 year old female with PMH as noted above presents with back-to-back episodes of sharp, atypical, nonexertional chest pain, subsequently with near syncope, but all of her symptoms have resolved.  She has been asymptomatic for the last 3 hours.  Physical exam is unremarkable for acute findings.  EKG is nonischemic.  Differential  diagnosis includes, but is not limited to, neuropathic pain, musculoskeletal pain, GERD, other benign etiology.  I have a low suspicion for ACS given the atypical time course and quality of the pain.  Although the patient cannot be ruled out by [facility] due to her age, there is no clinical evidence for PE especially given the fact that the pain has completely resolved.  Similarly there is no clinical evidence for aortic dissection or other vascular cause.  BMP and CBC show no acute findings.  Troponin is negative.  Chest x-ray is clear.  Will obtain repeat troponin.  If this is negative anticipate discharge home.  Patient's presentation is most consistent with acute presentation with potential threat to life or bodily function.  ----------------------------------------- 5:11 PM on 02/22/2024 -----------------------------------------  Repeat troponin is negative.  The patient has had no recurrent symptoms.  She is stable for discharge home at this time.  I have given her cardiology referral.  I gave strict return precautions, and she expressed understanding.  FINAL CLINICAL IMPRESSION(S) / ED DIAGNOSES   Final diagnoses:  Atypical chest pain     Rx / DC Orders   ED Discharge Orders          Ordered    Ambulatory referral to Cardiology       Comments: If you have not heard from the Cardiology office within the next 72 hours please call [phone].   02/22/24 1710             Note:  This document was prepared using Dragon voice recognition software and may include unintentional dictation errors.    Cassis Waylon, MD 02/22/24 1949

## 2024-02-22 NOTE — ED Notes (Signed)
 EMS iv was removed left ac

## 2024-02-22 NOTE — Discharge Instructions (Signed)
 Follow-up with cardiology.  Return to the ER for new, worsening, or persistent severe chest pain, difficulty breathing, weakness or dizziness, or any other new or worsening symptoms that concern you.

## 2024-02-22 NOTE — ED Triage Notes (Signed)
 First nurse note: Pt to ED via [facility] from work. Pt reports was sitting at work and sudden onset of intermittent sharp CP. 3/10. Pt did not have ASA but took goodie's powder from work. Pt also endorses dizziness. 20g LAC

## 2024-02-27 ENCOUNTER — Encounter: Payer: Self-pay | Admitting: *Deleted

## 2024-03-02 DIAGNOSIS — R079 Chest pain, unspecified: Secondary | ICD-10-CM | POA: Insufficient documentation

## 2024-03-02 NOTE — Progress Notes (Unsigned)
 Cardiology Office Note  Date:  03/04/2024   ID:  Katelyn Holland, DOB [birth year], MRN [id number]  PCP:  Katelyn Tinnie Fuller, NP (Inactive)   Chief Complaint  Patient presents with   New Patient (Initial Visit)    Dr.Siadecki for atypical chest pain. Patient was at [facility] ER with chest pain on 02/22/2024. Patient c/o fluttering in chest at times & shortness of breath with little exertion.     HPI:  Katelyn Holland a 48 y.o. femalewith past medical history of: Hyperlipidemia Strong family history of cardiac disease Who presents by referral from Dr.Siadecki for atypical chest pain  Seen in the ER February 22, 2024 for chest pain x3 acute onset around noon today while at work, occurring while she was standing outside talking to somebody.  She states that the pain was sharp, sudden onset, and in the middle of her chest.  It lasted for about 3 minutes and then went away.  She subsequently had two further episodes like this, few minutes apart,  EMTs called, was chest pain-free when they arrived, chest pain-free in the ER -ER workup negative  Since then reports 1 or 2 episodes of shortness of breath like she had run a marathon 1 episode was changing sheets, had to lay on the bed to recover  Currently feels well without complaints Works 2 jobs  Family hx of CAD (mother 54, grandfather, father with PAD)  Total chol 220 A1C 5.8  EKG personally reviewed by myself on todays visit EKG Interpretation Date/Time:  Monday March 04 2024 08:50:58 EDT Ventricular Rate:  75 PR Interval:  154 QRS Duration:  80 QT Interval:  366 QTC Calculation: 408 R Axis:   24  Text Interpretation: Normal sinus rhythm Possible Left atrial enlargement When compared with ECG of 22-Feb-2024 12:32, No significant change was found Confirmed by Perla Lye [phone]) on 03/04/2024 8:55:32 AM     PMH:   has a past medical history of Arthritis, Chronic constipation, Gravida 1 para 1, Kidney stone, and Kidney  stones.  PSH:    Past Surgical History:  Procedure Laterality Date   ABDOMINAL HYSTERECTOMY     APPENDECTOMY     BACK SURGERY     x 2  Initial surgery unknown procedure. 2nd surgery repaired disc and compressed nerve for left leg   KIDNEY STONE SURGERY Left 2000   surgery through back   LAPAROSCOPIC GASTRIC SLEEVE RESECTION  2016   LITHOTRIPSY  2007   NEPHRECTOMY Left    NEPHROLITHOTOMY Left 07/23/2015   Procedure: NEPHROLITHOTOMY PERCUTANEOUS;  Surgeon: Rosina Riis, MD;  Location: [facility] ORS;  Service: Urology;  Laterality: Left;    Current Outpatient Medications  Medication Sig Dispense Refill   cetirizine  (ZYRTEC ) 10 MG tablet Take 1 tablet (10 mg total) by mouth daily. (Patient not taking: Reported on 03/04/2024) 30 tablet 0   fluticasone  (FLONASE ) 50 MCG/ACT nasal spray Place 1 spray into both nostrils 2 (two) times daily. (Patient not taking: Reported on 03/04/2024) 16 g 0   gabapentin  (NEURONTIN ) 300 MG capsule TAKE 1 CAPSULE(300 MG) BY MOUTH AT BEDTIME (Patient not taking: Reported on 03/04/2024) 90 capsule 1   tiZANidine  (ZANAFLEX ) 4 MG tablet TAKE 1 TABLET(4 MG) BY MOUTH EVERY 8 HOURS AS NEEDED FOR MUSCLE SPASMS (Patient not taking: Reported on 03/04/2024) 90 tablet 0   valACYclovir  (VALTREX ) 500 MG tablet TAKE 1 TABLET BY MOUTH ONCE DAILY. FOR FLARE, MAY TAKE 2 TABLETS TWICE DAILY FOR 1 DAY (Patient not taking: Reported on 03/04/2024)  30 tablet 3   No current facility-administered medications for this visit.     Allergies:   Dilaudid   [hydromorphone  hcl], Iodinated contrast media, and Other   Social History:  The patient  reports that she has never smoked. She has never used smokeless tobacco. She reports current alcohol use. She reports that she does not use drugs.   Family History:   family history includes Cancer in her maternal grandmother; Heart attack in her paternal grandfather; Heart attack (age of onset: 90) in her mother; Heart attack (age of onset: 78) in her  father; Heart disease in her father; Heart disease (age of onset: 41) in her mother.    Review of Systems: Review of Systems  Constitutional: Negative.   HENT: Negative.    Respiratory: Negative.    Cardiovascular: Negative.   Gastrointestinal: Negative.   Musculoskeletal: Negative.   Neurological: Negative.   Psychiatric/Behavioral: Negative.    All other systems reviewed and are negative.  PHYSICAL EXAM: VS:  BP 118/80 (BP Location: Left Arm, Patient Position: Sitting, Cuff Size: Normal)   Ht 5' 5 (1.651 m)   Wt 205 lb 2 oz (93 kg)   SpO2 98%   BMI 34.13 kg/m  , BMI Body mass index is 34.13 kg/m. GEN: Well nourished, well developed, in no acute distress HEENT: normal Neck: no JVD, carotid bruits, or masses Cardiac: RRR; 1/6 systolic ejection murmur left sternal border, no gallops,no edema  Respiratory:  clear to auscultation bilaterally, normal work of breathing GI: soft, nontender, nondistended, + BS MS: no deformity or atrophy Skin: warm and dry, no rash Neuro:  Strength and sensation are intact Psych: euthymic mood, full affect  Recent Labs: 02/22/2024: BUN 16; Creatinine, Ser 0.89; Hemoglobin 11.4; Platelets 232; Potassium 4.1; Sodium 138    Lipid Panel Lab Results  Component Value Date   CHOL 186 01/05/2018   HDL 42 (L) 01/05/2018   LDLCALC 114 (H) 01/05/2018   TRIG 188 (H) 01/05/2018      Wt Readings from Last 3 Encounters:  03/04/24 205 lb 2 oz (93 kg)  02/22/24 206 lb (93.4 kg)  11/01/23 185 lb (83.9 kg)     ASSESSMENT AND PLAN:  Problem List Items Addressed This Visit     Chronic pain (Chronic)   Relevant Orders   EKG 12-Lead   Chest pain of uncertain etiology - Primary   Relevant Orders   EKG 12-Lead   Family history of heart disease in female family member before age 32   Relevant Orders   EKG 12-Lead   Atypical chest pain Most likely musculoskeletal, local discomfort with no radiation in the chest wall area Given strong family  history, recommended screening study with CT calcium scoring For elevated score, would treat cholesterol aggressively  Hyperlipidemia Total cholesterol 220, strong family history Calcium score for risk stratification as above  Shortness of breath Reports 1 or 2 episodes of shortness of breath with a feeling like she had run a marathon Recommend she use her Apple Watch to track heart rate when she has symptoms For any tachycardic episodes, Zio monitor could be ordered -If symptoms of shortness of breath continue to present, she will call us , she may benefit from echocardiogram   Patient was seen in consultation for Tinnie Berber and Dr. Jacolyn and will be referred back to their office for ongoing care of the issues detailed above  Signed, Velinda Lunger, M.D., Ph.D. [facility] [place], [place] [phone]

## 2024-03-04 ENCOUNTER — Ambulatory Visit: Payer: Self-pay | Attending: Cardiovascular Disease | Admitting: Cardiovascular Disease

## 2024-03-04 ENCOUNTER — Encounter: Payer: Self-pay | Admitting: Cardiovascular Disease

## 2024-03-04 VITALS — BP 118/80 | HR 75 | Ht 65.0 in | Wt 205.1 lb

## 2024-03-04 DIAGNOSIS — Z8249 Family history of ischemic heart disease and other diseases of the circulatory system: Secondary | ICD-10-CM

## 2024-03-04 DIAGNOSIS — G8929 Other chronic pain: Secondary | ICD-10-CM

## 2024-03-04 DIAGNOSIS — R079 Chest pain, unspecified: Secondary | ICD-10-CM

## 2024-03-04 NOTE — Patient Instructions (Addendum)
Medication Instructions:  No changes  If you need a refill on your cardiac medications before your next appointment, please call your pharmacy.   Lab work: No new labs needed  Testing/Procedures: CT coronary calcium score.   - $99 out of pocket cost at the time of your test - Call [phone] to schedule at your convenience.  [facility] [address] , [place] [address]   Coronary CalciumScan A coronary calcium scan is an imaging test used to look for deposits of calcium and other fatty materials (plaques) in the inner lining of the blood vessels of the heart (coronary arteries). These deposits of calcium and plaques can partly clog and narrow the coronary arteries without producing any symptoms or warning signs. This puts a person at risk for a heart attack. This test can detect these deposits before symptoms develop. Tell a health care provider about: Any allergies you have. All medicines you are taking, including vitamins, herbs, eye drops, creams, and over-the-counter medicines. Any problems you or family members have had with anesthetic medicines. Any blood disorders you have. Any surgeries you have had. Any medical conditions you have. Whether you are pregnant or may be pregnant. What are the risks? Generally, this is a safe procedure. However, problems may occur, including: Harm to a pregnant woman and her unborn baby. This test involves the use of radiation. Radiation exposure can be dangerous to a pregnant woman and her unborn baby. If you are pregnant, you generally should not have this procedure done. Slight increase in the risk of cancer. This is because of the radiation involved in the test. What happens before the procedure? No preparation is needed for this procedure. What happens during the procedure? You will undress and remove any jewelry around your neck or chest. You will put on a hospital gown. Sticky  electrodes will be placed on your chest. The electrodes will be connected to an electrocardiogram (ECG) machine to record a tracing of the electrical activity of your heart. A CT scanner will take pictures of your heart. During this time, you will be asked to lie still and hold your breath for 2-3 seconds while a picture of your heart is being taken. The procedure may vary among health care providers and hospitals. What happens after the procedure? You can get dressed. You can return to your normal activities. It is up to you to get the results of your test. Ask your health care provider, or the department that is doing the test, when your results will be ready. Summary A coronary calcium scan is an imaging test used to look for deposits of calcium and other fatty materials (plaques) in the inner lining of the blood vessels of the heart (coronary arteries). Generally, this is a safe procedure. Tell your health care provider if you are pregnant or may be pregnant. No preparation is needed for this procedure. A CT scanner will take pictures of your heart. You can return to your normal activities after the scan is done. This information is not intended to replace advice given to you by your health care provider. Make sure you discuss any questions you have with your health care provider. Document Released: 12/24/2007 Document Revised: 05/16/2016 Document Reviewed: 05/16/2016 Elsevier Interactive Patient Education  2017 Elsevier Inc.  Follow-Up: At [facility], you and your health needs are our priority.  As part of our continuing mission to provide you with exceptional heart care, we have created designated Provider Care   Teams.  These Care Teams include your primary Cardiologist (physician) and Advanced Practice Providers (APPs -  Physician Assistants and Nurse Practitioners) who all work together to provide you with the care you need, when you need it.  You will need a follow up appointment as  needed  Providers on your designated Care Team:   Christopher Berge, NP Ryan Dunn, PA-C Cadence Furth, PA-C  COVID-19 Vaccine Information can be found at: [email or web address] For questions related to vaccine distribution or appointments, please email [email or web address] or call [phone].   

## 2024-04-11 ENCOUNTER — Encounter: Payer: Self-pay | Admitting: Emergency Medicine

## 2024-07-17 ENCOUNTER — Encounter: Payer: Self-pay | Admitting: Emergency Medicine

## 2024-07-17 ENCOUNTER — Other Ambulatory Visit: Payer: Self-pay

## 2024-07-17 ENCOUNTER — Emergency Department
Admission: EM | Admit: 2024-07-17 | Discharge: 2024-07-17 | Disposition: A | Discharge status: Home / Self Care | Attending: Emergency Medicine | Admitting: Emergency Medicine

## 2024-07-17 DIAGNOSIS — J111 Influenza due to unidentified influenza virus with other respiratory manifestations: Secondary | ICD-10-CM | POA: Diagnosis not present

## 2024-07-17 DIAGNOSIS — R509 Fever, unspecified: Secondary | ICD-10-CM | POA: Diagnosis present

## 2024-07-17 MED ORDER — BENZONATATE 100 MG PO CAPS: 100.0000 mg | ORAL_CAPSULE | Freq: Three times a day (TID) | ORAL | 0 refills | Status: AC | PRN

## 2024-07-17 MED ORDER — OSELTAMIVIR PHOSPHATE 75 MG PO CAPS: 75.0000 mg | ORAL_CAPSULE | Freq: Two times a day (BID) | ORAL | 0 refills | Status: AC

## 2024-07-17 NOTE — ED Notes (Addendum)
 Pt states that they have had flu like symptoms x2 days (says lots of people in their office have had the flu), fever, chills, cough, sore throat and body aches;  saw a fever of 105.1 the other night. Pt reports taking mucinex, delsym, using vicks vaporub, nyquil which helped a little but with symptoms. Pt states the delsym helped dry up their congestion but dried them up and only has 1 kidney so they have to be careful with the fluids they drink. Pt VSS at this time.

## 2024-07-17 NOTE — ED Provider Notes (Signed)
 "   [facility] Emergency Department Provider Note     Event Date/Time   First MD Initiated Contact with Patient 07/17/24 1438     (approximate)   History   Fever and Cough   HPI  Katelyn Holland is a 49 y.o. female presents to the ED for evaluation of cough, fever, chills and generalized body aches with onset 2 days ago.  Patient reports she has been taking nyquil and mucinex with relief. Endorses recent exposure to influenza amongst her coworkers.  Denies chest pain shortness of breath.     Physical Exam   Triage Vital Signs: ED Triage Vitals  Encounter Vitals Group     BP 07/17/24 1305 (!) 149/76     Girls Systolic BP Percentile --      Girls Diastolic BP Percentile --      Boys Systolic BP Percentile --      Boys Diastolic BP Percentile --      Pulse Rate 07/17/24 1305 73     Resp 07/17/24 1305 20     Temp 07/17/24 1305 98.4 F (36.9 C)     Temp Source 07/17/24 1305 Oral     SpO2 07/17/24 1305 97 %     Weight 07/17/24 1303 210 lb (95.3 kg)     Height 07/17/24 1303 5' 5 (1.651 m)     Head Circumference --      Peak Flow --      Pain Score 07/17/24 1303 3     Pain Loc --      Pain Education --      Exclude from Growth Chart --     Most recent vital signs: Vitals:   07/17/24 1630 07/17/24 1700  BP: (!) 129/50 131/68  Pulse: (!) 56 63  Resp:  16  Temp:  98.6 F (37 C)  SpO2: 97% 98%    General: Alert and oriented. INAD.    Head:  NCAT.  Eyes:  PERRLA. EOMI.  Nose:   Mucosa is moist. No rhinorrhea. Throat: Oropharynx clear. No erythema or exudates.  Cobblestoning noted.  Tonsils not enlarged. Uvula is midline. CV:  Good peripheral perfusion. RRR. RESP:  Normal effort. LCTAB. No retractions.  ABD:  No distention. Soft, Non tender.   ED Results / Procedures / Treatments   Labs (all labs ordered are listed, but only abnormal results are displayed) Labs Reviewed - No data to display   No results  found.  PROCEDURES:  Critical Care performed: No  Procedures   MEDICATIONS ORDERED IN ED: Medications - No data to display   IMPRESSION / MDM / ASSESSMENT AND PLAN / ED COURSE  I reviewed the triage vital signs and the nursing notes.                              Clinical Course as of 07/17/24 1845  Wed Jul 17, 2024  1550 2 days. [MH]    Clinical Course User Index [MH] Margrette Monte A, PA-C    49 y.o. female presents to the emergency department for evaluation and treatment of URI symptoms. See HPI for further details.   Differential diagnosis includes, but is not limited to viral URI, influenza, COVID  Patient's presentation is most consistent with acute, uncomplicated illness.  Presents to the ED for evaluation of URI symptoms.  Physical exam findings are overall benign.  She is hemodynamically stable and afebrile.  Physical exam findings  are stated above.  Normal lung exam.  Symptoms are most consistent with a viral upper respiratory infection including influenza given her recent exposure.  Education on symptomatic care treatment was provided to patient.  She is in stable condition for discharge home.  Given that the patient is in the 2-day window of Tamiflu , Tamiflu  sent to pharmacy.  Will also send Tessalon  Perles for cough.  Patient is agreement with this care plan.  ED return precaution discussed.  Advised to follow-up with primary care provider if symptoms not improved.  FINAL CLINICAL IMPRESSION(S) / ED DIAGNOSES   Final diagnoses:  Influenza-like illness    Rx / DC Orders   ED Discharge Orders          Ordered    benzonatate  (TESSALON ) 100 MG capsule  3 times daily PRN        07/17/24 1652    oseltamivir  (TAMIFLU ) 75 MG capsule  2 times daily        07/17/24 1654           Note:  This document was prepared using Dragon voice recognition software and may include unintentional dictation errors.    Margrette, Aubreanna Percle A, PA-C 07/17/24 1845    Viviann Pastor, MD 07/17/24 2259  "

## 2024-07-17 NOTE — Discharge Instructions (Signed)
 You were seen in the emergency department today for a cough. Your symptoms are consistent with a viral upper respiratory infection including influenza.   A viral cough may last up to 2-3 weeks.   Stay hydrated by drinking plenty of fluids to thin mucus. Get adequate amount of sleep and avoid overexertion. Consider a humidifier at night. Warm teas and a spoonful of honey may help reduce cough frequency. Follow up with your primary care provider as needed.  Pain control:  Acetaminophen  (tylenol ) - You can take 2 extra strength tablets (1000 mg) every 6 hours as needed for pain/fever.  Make sure you eat food/drink water when taking these medications.

## 2024-07-17 NOTE — ED Triage Notes (Signed)
 Pt via POV from home. Pt c/o flu like symptoms including chills, fever, cough, generalized body aches since yesterday. States her coworkers has been sick. Pt is A&Ox4 and NAD, ambulatory to triage.

## 2024-07-21 ENCOUNTER — Emergency Department
Admission: EM | Admit: 2024-07-21 | Discharge: 2024-07-21 | Disposition: A | Discharge status: Home / Self Care | Attending: Emergency Medicine | Admitting: Emergency Medicine

## 2024-07-21 ENCOUNTER — Other Ambulatory Visit: Payer: Self-pay

## 2024-07-21 DIAGNOSIS — M791 Myalgia, unspecified site: Secondary | ICD-10-CM | POA: Insufficient documentation

## 2024-07-21 DIAGNOSIS — R197 Diarrhea, unspecified: Secondary | ICD-10-CM | POA: Insufficient documentation

## 2024-07-21 DIAGNOSIS — R059 Cough, unspecified: Secondary | ICD-10-CM | POA: Insufficient documentation

## 2024-07-21 DIAGNOSIS — R112 Nausea with vomiting, unspecified: Secondary | ICD-10-CM | POA: Diagnosis present

## 2024-07-21 DIAGNOSIS — R5381 Other malaise: Secondary | ICD-10-CM | POA: Diagnosis not present

## 2024-07-21 DIAGNOSIS — R6889 Other general symptoms and signs: Secondary | ICD-10-CM

## 2024-07-21 LAB — URINALYSIS, ROUTINE W REFLEX MICROSCOPIC
Bacteria, UA: NONE SEEN
Bilirubin Urine: NEGATIVE
Glucose, UA: NEGATIVE mg/dL
Hgb urine dipstick: NEGATIVE
Ketones, ur: NEGATIVE mg/dL
Nitrite: NEGATIVE
Protein, ur: NEGATIVE mg/dL
Specific Gravity, Urine: 1.012 (ref 1.005–1.030)
pH: 5 (ref 5.0–8.0)

## 2024-07-21 LAB — CBC WITH DIFFERENTIAL/PLATELET
Abs Immature Granulocytes: 0.01 K/uL (ref 0.00–0.07)
Basophils Absolute: 0 K/uL (ref 0.0–0.1)
Basophils Relative: 1 %
Eosinophils Absolute: 0.1 K/uL (ref 0.0–0.5)
Eosinophils Relative: 1 %
HCT: 35.4 % — ABNORMAL LOW (ref 36.0–46.0)
Hemoglobin: 11.7 g/dL — ABNORMAL LOW (ref 12.0–15.0)
Immature Granulocytes: 0 %
Lymphocytes Relative: 32 %
Lymphs Abs: 2.1 K/uL (ref 0.7–4.0)
MCH: 27.9 pg (ref 26.0–34.0)
MCHC: 33.1 g/dL (ref 30.0–36.0)
MCV: 84.3 fL (ref 80.0–100.0)
Monocytes Absolute: 0.3 K/uL (ref 0.1–1.0)
Monocytes Relative: 4 %
Neutro Abs: 4 K/uL (ref 1.7–7.7)
Neutrophils Relative %: 62 %
Platelets: 216 K/uL (ref 150–400)
RBC: 4.2 MIL/uL (ref 3.87–5.11)
RDW: 11.9 % (ref 11.5–15.5)
WBC: 6.5 K/uL (ref 4.0–10.5)
nRBC: 0 % (ref 0.0–0.2)

## 2024-07-21 LAB — COMPREHENSIVE METABOLIC PANEL WITH GFR
ALT: 32 U/L (ref 0–44)
AST: 42 U/L — ABNORMAL HIGH (ref 15–41)
Albumin: 4.3 g/dL (ref 3.5–5.0)
Alkaline Phosphatase: 103 U/L (ref 38–126)
Anion gap: 8 (ref 5–15)
BUN: 12 mg/dL (ref 6–20)
CO2: 26 mmol/L (ref 22–32)
Calcium: 9.2 mg/dL (ref 8.9–10.3)
Chloride: 104 mmol/L (ref 98–111)
Creatinine, Ser: 0.92 mg/dL (ref 0.44–1.00)
GFR, Estimated: >60 mL/min
Glucose, Bld: 93 mg/dL (ref 70–99)
Potassium: 4.3 mmol/L (ref 3.5–5.1)
Sodium: 139 mmol/L (ref 135–145)
Total Bilirubin: 0.3 mg/dL (ref 0.0–1.2)
Total Protein: 7 g/dL (ref 6.5–8.1)

## 2024-07-21 MED ORDER — ONDANSETRON HCL 4 MG/2ML IJ SOLN
4.0000 mg | Freq: Once | INTRAMUSCULAR | Status: AC
  Administered 2024-07-21: 4 mg via INTRAVENOUS
  Filled 2024-07-21: qty 2

## 2024-07-21 MED ORDER — METOCLOPRAMIDE HCL 10 MG PO TABS: 10.0000 mg | ORAL_TABLET | Freq: Three times a day (TID) | ORAL | 0 refills | Status: AC

## 2024-07-21 MED ORDER — ALBUTEROL SULFATE HFA 108 (90 BASE) MCG/ACT IN AERS: 2.0000 | INHALATION_SPRAY | Freq: Four times a day (QID) | RESPIRATORY_TRACT | 0 refills | Status: AC | PRN

## 2024-07-21 MED ORDER — METOCLOPRAMIDE HCL 5 MG/ML IJ SOLN
10.0000 mg | Freq: Once | INTRAMUSCULAR | Status: AC
  Administered 2024-07-21: 10 mg via INTRAVENOUS
  Filled 2024-07-21: qty 2

## 2024-07-21 MED ORDER — LACTATED RINGERS IV BOLUS
1000.0000 mL | Freq: Once | INTRAVENOUS | Status: AC
  Administered 2024-07-21: 1000 mL via INTRAVENOUS

## 2024-07-21 NOTE — ED Provider Notes (Signed)
 "  [facility] Provider Note    Event Date/Time   First MD Initiated Contact with Patient 07/21/24 1330     (approximate)   History   Chief Complaint flu+   HPI  Katelyn Holland is a 49 y.o. female with past medical history of chronic pain syndrome and kidney stones who presents to the ED complaining of nausea and vomiting.  Patient reports that she was recently told that she had the flu 4 days ago after presenting to the ED with cough, general malaise, and bodyaches.  She states that she has continued to feel worse since then and has developed nausea, vomiting, and diarrhea.  She reports being unable to keep down either liquids or solids over the past 24 hours, but she denies any abdominal pain, fever, flank pain, or dysuria.  She also denies any pain in her chest or difficulty breathing.     Physical Exam   Triage Vital Signs: ED Triage Vitals  Encounter Vitals Group     BP 07/21/24 1253 139/70     Girls Systolic BP Percentile --      Girls Diastolic BP Percentile --      Boys Systolic BP Percentile --      Boys Diastolic BP Percentile --      Pulse Rate 07/21/24 1253 74     Resp 07/21/24 1253 18     Temp 07/21/24 1253 98.7 F (37.1 C)     Temp src --      SpO2 07/21/24 1253 96 %     Weight --      Height --      Head Circumference --      Peak Flow --      Pain Score 07/21/24 1252 6     Pain Loc --      Pain Education --      Exclude from Growth Chart --     Most recent vital signs: Vitals:   07/21/24 1253 07/21/24 1614  BP: 139/70 114/71  Pulse: 74 70  Resp: 18 18  Temp: 98.7 F (37.1 C) 98.3 F (36.8 C)  SpO2: 96% 100%    Constitutional: Alert and oriented. Eyes: Conjunctivae are normal. Head: Atraumatic. Nose: No congestion/rhinnorhea. Mouth/Throat: Mucous membranes are moist.  Cardiovascular: Normal rate, regular rhythm. Grossly normal heart sounds.  2+ radial pulses bilaterally. Respiratory: Normal respiratory effort.  No  retractions. Lungs CTAB. Gastrointestinal: Soft and nontender. No distention. Musculoskeletal: No lower extremity tenderness nor edema.  Neurologic:  Normal speech and language. No gross focal neurologic deficits are appreciated.    ED Results / Procedures / Treatments   Labs (all labs ordered are listed, but only abnormal results are displayed) Labs Reviewed  URINALYSIS, ROUTINE W REFLEX MICROSCOPIC - Abnormal; Notable for the following components:      Result Value   Color, Urine YELLOW (*)    APPearance CLEAR (*)    Leukocytes,Ua SMALL (*)    All other components within normal limits  CBC WITH DIFFERENTIAL/PLATELET - Abnormal; Notable for the following components:   Hemoglobin 11.7 (*)    HCT 35.4 (*)    All other components within normal limits  COMPREHENSIVE METABOLIC PANEL WITH GFR - Abnormal; Notable for the following components:   AST 42 (*)    All other components within normal limits    PROCEDURES:  Critical Care performed: No  Procedures   MEDICATIONS ORDERED IN ED: Medications  ondansetron  (ZOFRAN ) injection 4 mg (4 mg  Intravenous Given 07/21/24 1449)  lactated ringers  bolus 1,000 mL (0 mLs Intravenous Stopped 07/21/24 1613)  metoCLOPramide  (REGLAN ) injection 10 mg (10 mg Intravenous Given 07/21/24 1619)     IMPRESSION / MDM / ASSESSMENT AND PLAN / ED COURSE  I reviewed the triage vital signs and the nursing notes.                              49 y.o. female with past medical history of chronic pain syndrome and kidney stones who presents to the ED complaining of nausea, vomiting, and diarrhea after recently being diagnosed with the flu.  Patient's presentation is most consistent with acute presentation with potential threat to life or bodily function.  Differential diagnosis includes, but is not limited to, influenza, dehydration, electrolyte abnormality, AKI, UTI.  Patient nontoxic-appearing and in no acute distress, vital signs are unremarkable.  She  has a benign abdominal exam and suspect vomiting and diarrhea are due to influenza or other viral illness.  We will hold off on viral panel given limited availability of tests at this time.  Plan to treat symptomatically with IV Zofran  and hydrate with IV fluids, lab results are pending.  Labs without significant anemia, leukocytosis, electrolyte abnormality, AKI.  LFTs and lipase are unremarkable and urinalysis shows no signs of infection.  Patient continued to feel nauseous following Zofran , but felt much better following dose of IV Reglan .  She is tolerating oral intake without difficulty and appropriate for discharge home with outpatient follow-up as needed.  She was counseled to return to the ED for new or worsening symptoms.  Patient agrees with plan.      FINAL CLINICAL IMPRESSION(S) / ED DIAGNOSES   Final diagnoses:  Flu-like symptoms  Nausea and vomiting, unspecified vomiting type     Rx / DC Orders   ED Discharge Orders          Ordered    metoCLOPramide  (REGLAN ) 10 MG tablet  3 times daily with meals        07/21/24 1736             Note:  This document was prepared using Dragon voice recognition software and may include unintentional dictation errors.   Willo Dunnings, MD 07/21/24 1739  "

## 2024-07-21 NOTE — ED Triage Notes (Signed)
 Pt comes iwht flu dx on the  7th. Pt states she is getting worse and not eating. Pt states diarrhea and strong urine odor. Pt states she has been taking her meds  they prescribed her.
